# Patient Record
Sex: Female | Born: 1935 | Race: White | Hispanic: No | Smoking: Never smoker
Health system: Southern US, Community
[De-identification: ages and names within clinical notes are randomized; demographics above are authoritative.]

## PROBLEM LIST (undated history)

## (undated) DIAGNOSIS — Z79890 Hormone replacement therapy (postmenopausal): Secondary | ICD-10-CM

## (undated) DIAGNOSIS — F0390 Unspecified dementia without behavioral disturbance: Secondary | ICD-10-CM

## (undated) DIAGNOSIS — E785 Hyperlipidemia, unspecified: Secondary | ICD-10-CM

## (undated) DIAGNOSIS — I4510 Unspecified right bundle-branch block: Secondary | ICD-10-CM

## (undated) DIAGNOSIS — K449 Diaphragmatic hernia without obstruction or gangrene: Secondary | ICD-10-CM

## (undated) DIAGNOSIS — M797 Fibromyalgia: Secondary | ICD-10-CM

## (undated) DIAGNOSIS — K317 Polyp of stomach and duodenum: Secondary | ICD-10-CM

## (undated) DIAGNOSIS — K219 Gastro-esophageal reflux disease without esophagitis: Secondary | ICD-10-CM

## (undated) DIAGNOSIS — F329 Major depressive disorder, single episode, unspecified: Secondary | ICD-10-CM

## (undated) DIAGNOSIS — I10 Essential (primary) hypertension: Secondary | ICD-10-CM

## (undated) DIAGNOSIS — K635 Polyp of colon: Secondary | ICD-10-CM

## (undated) DIAGNOSIS — F32A Depression, unspecified: Secondary | ICD-10-CM

## (undated) HISTORY — DX: Unspecified right bundle-branch block: I45.10

## (undated) HISTORY — DX: Diaphragmatic hernia without obstruction or gangrene: K44.9

## (undated) HISTORY — DX: Essential (primary) hypertension: I10

## (undated) HISTORY — PX: LUMBAR SPINE SURGERY: SHX701

## (undated) HISTORY — DX: Fibromyalgia: M79.7

## (undated) HISTORY — DX: Major depressive disorder, single episode, unspecified: F32.9

## (undated) HISTORY — DX: Unspecified dementia, unspecified severity, without behavioral disturbance, psychotic disturbance, mood disturbance, and anxiety: F03.90

## (undated) HISTORY — DX: Depression, unspecified: F32.A

## (undated) HISTORY — DX: Polyp of stomach and duodenum: K31.7

## (undated) HISTORY — DX: Polyp of colon: K63.5

## (undated) HISTORY — DX: Gastro-esophageal reflux disease without esophagitis: K21.9

## (undated) HISTORY — DX: Hyperlipidemia, unspecified: E78.5

## (undated) HISTORY — DX: Hormone replacement therapy: Z79.890

---

## 1998-06-26 ENCOUNTER — Other Ambulatory Visit: Admission: RE | Admit: 1998-06-26 | Discharge: 1998-06-26 | Payer: Self-pay | Admitting: Obstetrics and Gynecology

## 1999-06-29 ENCOUNTER — Other Ambulatory Visit: Admission: RE | Admit: 1999-06-29 | Discharge: 1999-06-29 | Payer: Self-pay | Admitting: Obstetrics and Gynecology

## 1999-11-17 ENCOUNTER — Encounter (INDEPENDENT_AMBULATORY_CARE_PROVIDER_SITE_OTHER): Payer: Self-pay | Admitting: Specialist

## 1999-11-17 ENCOUNTER — Ambulatory Visit (HOSPITAL_COMMUNITY): Admission: RE | Admit: 1999-11-17 | Discharge: 1999-11-17 | Payer: Self-pay | Admitting: Obstetrics and Gynecology

## 2000-06-07 ENCOUNTER — Other Ambulatory Visit: Admission: RE | Admit: 2000-06-07 | Discharge: 2000-06-07 | Payer: Self-pay | Admitting: Obstetrics and Gynecology

## 2001-06-14 ENCOUNTER — Other Ambulatory Visit: Admission: RE | Admit: 2001-06-14 | Discharge: 2001-06-14 | Payer: Self-pay | Admitting: Obstetrics and Gynecology

## 2001-07-17 ENCOUNTER — Encounter: Payer: Self-pay | Admitting: Obstetrics and Gynecology

## 2001-07-17 ENCOUNTER — Encounter: Admission: RE | Admit: 2001-07-17 | Discharge: 2001-07-17 | Payer: Self-pay | Admitting: Obstetrics and Gynecology

## 2002-06-12 ENCOUNTER — Other Ambulatory Visit: Admission: RE | Admit: 2002-06-12 | Discharge: 2002-06-12 | Payer: Self-pay | Admitting: Gynecology

## 2003-03-06 ENCOUNTER — Encounter: Payer: Self-pay | Admitting: Orthopedic Surgery

## 2003-03-06 ENCOUNTER — Encounter: Admission: RE | Admit: 2003-03-06 | Discharge: 2003-03-06 | Payer: Self-pay | Admitting: Orthopedic Surgery

## 2003-07-25 ENCOUNTER — Encounter: Admission: RE | Admit: 2003-07-25 | Discharge: 2003-07-25 | Payer: Self-pay | Admitting: Family Medicine

## 2004-06-17 ENCOUNTER — Other Ambulatory Visit: Admission: RE | Admit: 2004-06-17 | Discharge: 2004-06-17 | Payer: Self-pay | Admitting: Gynecology

## 2004-07-20 ENCOUNTER — Encounter: Admission: RE | Admit: 2004-07-20 | Discharge: 2004-08-19 | Payer: Self-pay | Admitting: Family Medicine

## 2004-08-05 ENCOUNTER — Ambulatory Visit: Payer: Self-pay | Admitting: Internal Medicine

## 2004-09-15 ENCOUNTER — Encounter: Admission: RE | Admit: 2004-09-15 | Discharge: 2004-11-03 | Payer: Self-pay | Admitting: Orthopedic Surgery

## 2004-10-21 ENCOUNTER — Ambulatory Visit: Payer: Self-pay | Admitting: Internal Medicine

## 2004-11-01 ENCOUNTER — Encounter: Admission: RE | Admit: 2004-11-01 | Discharge: 2004-11-01 | Payer: Self-pay | Admitting: Family Medicine

## 2005-01-04 ENCOUNTER — Encounter: Admission: RE | Admit: 2005-01-04 | Discharge: 2005-01-04 | Payer: Self-pay | Admitting: Neurosurgery

## 2005-02-02 ENCOUNTER — Ambulatory Visit: Payer: Self-pay | Admitting: Internal Medicine

## 2005-02-18 ENCOUNTER — Encounter: Admission: RE | Admit: 2005-02-18 | Discharge: 2005-02-18 | Payer: Self-pay | Admitting: Neurosurgery

## 2005-05-05 ENCOUNTER — Encounter: Admission: RE | Admit: 2005-05-05 | Discharge: 2005-05-05 | Payer: Self-pay | Admitting: Neurosurgery

## 2005-05-31 ENCOUNTER — Ambulatory Visit: Payer: Self-pay | Admitting: Internal Medicine

## 2005-06-01 ENCOUNTER — Ambulatory Visit: Payer: Self-pay | Admitting: Internal Medicine

## 2005-07-06 ENCOUNTER — Ambulatory Visit: Payer: Self-pay | Admitting: Internal Medicine

## 2005-07-27 ENCOUNTER — Encounter: Admission: RE | Admit: 2005-07-27 | Discharge: 2005-07-27 | Payer: Self-pay | Admitting: Neurosurgery

## 2005-09-17 ENCOUNTER — Ambulatory Visit: Payer: Self-pay | Admitting: Internal Medicine

## 2005-09-30 ENCOUNTER — Encounter: Admission: RE | Admit: 2005-09-30 | Discharge: 2005-09-30 | Payer: Self-pay | Admitting: Neurosurgery

## 2005-11-04 ENCOUNTER — Ambulatory Visit: Payer: Self-pay | Admitting: Internal Medicine

## 2005-12-01 ENCOUNTER — Encounter: Admission: RE | Admit: 2005-12-01 | Discharge: 2005-12-01 | Payer: Self-pay | Admitting: Neurosurgery

## 2006-01-18 ENCOUNTER — Ambulatory Visit: Payer: Self-pay | Admitting: Internal Medicine

## 2006-02-24 ENCOUNTER — Ambulatory Visit: Payer: Self-pay | Admitting: Internal Medicine

## 2006-03-03 ENCOUNTER — Encounter: Admission: RE | Admit: 2006-03-03 | Discharge: 2006-03-03 | Payer: Self-pay | Admitting: Neurosurgery

## 2006-05-13 ENCOUNTER — Ambulatory Visit: Payer: Self-pay | Admitting: Internal Medicine

## 2006-06-08 ENCOUNTER — Encounter: Admission: RE | Admit: 2006-06-08 | Discharge: 2006-06-08 | Payer: Self-pay | Admitting: Neurosurgery

## 2006-06-20 ENCOUNTER — Ambulatory Visit: Payer: Self-pay | Admitting: Pulmonary Disease

## 2006-06-23 ENCOUNTER — Other Ambulatory Visit: Admission: RE | Admit: 2006-06-23 | Discharge: 2006-06-23 | Payer: Self-pay | Admitting: Gynecology

## 2006-07-27 ENCOUNTER — Encounter: Admission: RE | Admit: 2006-07-27 | Discharge: 2006-07-27 | Payer: Self-pay | Admitting: Neurosurgery

## 2006-08-16 ENCOUNTER — Ambulatory Visit: Payer: Self-pay | Admitting: Internal Medicine

## 2006-08-29 ENCOUNTER — Ambulatory Visit: Payer: Self-pay | Admitting: Internal Medicine

## 2006-09-08 ENCOUNTER — Encounter: Admission: RE | Admit: 2006-09-08 | Discharge: 2006-09-08 | Payer: Self-pay | Admitting: Neurosurgery

## 2006-10-03 ENCOUNTER — Ambulatory Visit: Payer: Self-pay | Admitting: Internal Medicine

## 2006-10-10 ENCOUNTER — Ambulatory Visit (HOSPITAL_COMMUNITY): Admission: RE | Admit: 2006-10-10 | Discharge: 2006-10-10 | Payer: Self-pay | Admitting: Family Medicine

## 2006-10-10 ENCOUNTER — Ambulatory Visit: Payer: Self-pay | Admitting: *Deleted

## 2006-10-10 ENCOUNTER — Encounter (INDEPENDENT_AMBULATORY_CARE_PROVIDER_SITE_OTHER): Payer: Self-pay | Admitting: *Deleted

## 2006-11-02 ENCOUNTER — Encounter: Admission: RE | Admit: 2006-11-02 | Discharge: 2006-11-02 | Payer: Self-pay | Admitting: Neurosurgery

## 2006-12-07 ENCOUNTER — Ambulatory Visit: Payer: Self-pay | Admitting: Internal Medicine

## 2006-12-28 ENCOUNTER — Encounter: Admission: RE | Admit: 2006-12-28 | Discharge: 2006-12-28 | Payer: Self-pay | Admitting: Neurosurgery

## 2007-01-17 ENCOUNTER — Ambulatory Visit: Payer: Self-pay | Admitting: Internal Medicine

## 2007-01-30 ENCOUNTER — Inpatient Hospital Stay (HOSPITAL_COMMUNITY): Admission: RE | Admit: 2007-01-30 | Discharge: 2007-02-01 | Payer: Self-pay | Admitting: Neurosurgery

## 2007-02-19 ENCOUNTER — Encounter: Admission: RE | Admit: 2007-02-19 | Discharge: 2007-02-19 | Payer: Self-pay | Admitting: Neurosurgery

## 2007-02-23 ENCOUNTER — Observation Stay (HOSPITAL_COMMUNITY): Admission: RE | Admit: 2007-02-23 | Discharge: 2007-02-24 | Payer: Self-pay | Admitting: Neurosurgery

## 2007-04-05 ENCOUNTER — Encounter: Admission: RE | Admit: 2007-04-05 | Discharge: 2007-04-05 | Payer: Self-pay | Admitting: Neurosurgery

## 2007-04-17 ENCOUNTER — Encounter: Admission: RE | Admit: 2007-04-17 | Discharge: 2007-05-04 | Payer: Self-pay | Admitting: Neurosurgery

## 2007-04-20 ENCOUNTER — Ambulatory Visit: Payer: Self-pay | Admitting: Internal Medicine

## 2007-07-04 ENCOUNTER — Encounter: Admission: RE | Admit: 2007-07-04 | Discharge: 2007-07-12 | Payer: Self-pay | Admitting: Orthopedic Surgery

## 2007-07-13 ENCOUNTER — Encounter: Admission: RE | Admit: 2007-07-13 | Discharge: 2007-08-14 | Payer: Self-pay | Admitting: Orthopedic Surgery

## 2007-07-19 DIAGNOSIS — J309 Allergic rhinitis, unspecified: Secondary | ICD-10-CM | POA: Insufficient documentation

## 2007-07-19 DIAGNOSIS — K219 Gastro-esophageal reflux disease without esophagitis: Secondary | ICD-10-CM

## 2007-07-19 DIAGNOSIS — J45909 Unspecified asthma, uncomplicated: Secondary | ICD-10-CM | POA: Insufficient documentation

## 2007-07-20 ENCOUNTER — Ambulatory Visit: Payer: Self-pay | Admitting: Internal Medicine

## 2007-07-24 ENCOUNTER — Encounter: Admission: RE | Admit: 2007-07-24 | Discharge: 2007-07-24 | Payer: Self-pay | Admitting: Neurosurgery

## 2007-08-15 ENCOUNTER — Encounter: Admission: RE | Admit: 2007-08-15 | Discharge: 2007-08-24 | Payer: Self-pay | Admitting: Orthopedic Surgery

## 2007-08-24 ENCOUNTER — Ambulatory Visit: Payer: Self-pay | Admitting: Internal Medicine

## 2007-10-16 ENCOUNTER — Encounter: Admission: RE | Admit: 2007-10-16 | Discharge: 2007-10-16 | Payer: Self-pay | Admitting: Internal Medicine

## 2007-12-06 ENCOUNTER — Ambulatory Visit: Payer: Self-pay | Admitting: Internal Medicine

## 2008-01-16 ENCOUNTER — Ambulatory Visit: Payer: Self-pay | Admitting: Internal Medicine

## 2008-04-05 ENCOUNTER — Ambulatory Visit: Payer: Self-pay | Admitting: Internal Medicine

## 2008-04-12 ENCOUNTER — Ambulatory Visit: Payer: Self-pay | Admitting: Internal Medicine

## 2008-04-15 ENCOUNTER — Ambulatory Visit: Payer: Self-pay | Admitting: Internal Medicine

## 2008-04-15 ENCOUNTER — Telehealth (INDEPENDENT_AMBULATORY_CARE_PROVIDER_SITE_OTHER): Payer: Self-pay | Admitting: *Deleted

## 2008-04-18 ENCOUNTER — Telehealth: Payer: Self-pay | Admitting: Internal Medicine

## 2008-04-19 ENCOUNTER — Ambulatory Visit: Payer: Self-pay | Admitting: Internal Medicine

## 2008-04-26 ENCOUNTER — Ambulatory Visit: Payer: Self-pay | Admitting: Internal Medicine

## 2008-04-26 ENCOUNTER — Ambulatory Visit: Payer: Self-pay | Admitting: Cardiology

## 2008-05-17 ENCOUNTER — Ambulatory Visit: Payer: Self-pay | Admitting: Internal Medicine

## 2008-06-05 ENCOUNTER — Ambulatory Visit: Payer: Self-pay | Admitting: Cardiology

## 2008-06-13 ENCOUNTER — Ambulatory Visit: Payer: Self-pay

## 2008-07-10 ENCOUNTER — Telehealth (INDEPENDENT_AMBULATORY_CARE_PROVIDER_SITE_OTHER): Payer: Self-pay | Admitting: *Deleted

## 2008-07-22 ENCOUNTER — Telehealth (INDEPENDENT_AMBULATORY_CARE_PROVIDER_SITE_OTHER): Payer: Self-pay | Admitting: *Deleted

## 2008-07-22 ENCOUNTER — Ambulatory Visit: Payer: Self-pay | Admitting: Internal Medicine

## 2008-07-24 ENCOUNTER — Ambulatory Visit: Payer: Self-pay | Admitting: Internal Medicine

## 2008-07-30 ENCOUNTER — Telehealth (INDEPENDENT_AMBULATORY_CARE_PROVIDER_SITE_OTHER): Payer: Self-pay | Admitting: *Deleted

## 2008-08-01 ENCOUNTER — Ambulatory Visit: Payer: Self-pay | Admitting: Internal Medicine

## 2008-09-09 ENCOUNTER — Ambulatory Visit: Payer: Self-pay | Admitting: Internal Medicine

## 2008-11-28 ENCOUNTER — Ambulatory Visit: Payer: Self-pay | Admitting: Internal Medicine

## 2008-12-11 ENCOUNTER — Telehealth (INDEPENDENT_AMBULATORY_CARE_PROVIDER_SITE_OTHER): Payer: Self-pay | Admitting: *Deleted

## 2008-12-12 ENCOUNTER — Ambulatory Visit: Payer: Self-pay | Admitting: Internal Medicine

## 2008-12-26 ENCOUNTER — Telehealth (INDEPENDENT_AMBULATORY_CARE_PROVIDER_SITE_OTHER): Payer: Self-pay | Admitting: *Deleted

## 2009-03-10 ENCOUNTER — Ambulatory Visit: Payer: Self-pay | Admitting: Internal Medicine

## 2009-03-19 ENCOUNTER — Ambulatory Visit: Payer: Self-pay | Admitting: Internal Medicine

## 2009-04-21 ENCOUNTER — Ambulatory Visit: Payer: Self-pay | Admitting: Internal Medicine

## 2009-04-28 ENCOUNTER — Encounter: Payer: Self-pay | Admitting: Internal Medicine

## 2009-06-03 ENCOUNTER — Telehealth: Payer: Self-pay | Admitting: Internal Medicine

## 2009-07-07 ENCOUNTER — Ambulatory Visit: Payer: Self-pay | Admitting: Internal Medicine

## 2009-10-14 ENCOUNTER — Ambulatory Visit: Payer: Self-pay | Admitting: Internal Medicine

## 2009-10-20 ENCOUNTER — Ambulatory Visit: Payer: Self-pay | Admitting: Internal Medicine

## 2009-11-18 ENCOUNTER — Telehealth (INDEPENDENT_AMBULATORY_CARE_PROVIDER_SITE_OTHER): Payer: Self-pay | Admitting: *Deleted

## 2009-11-19 ENCOUNTER — Ambulatory Visit: Payer: Self-pay | Admitting: Internal Medicine

## 2009-11-21 ENCOUNTER — Encounter: Payer: Self-pay | Admitting: Internal Medicine

## 2010-03-11 ENCOUNTER — Ambulatory Visit: Payer: Self-pay | Admitting: Internal Medicine

## 2010-03-19 ENCOUNTER — Ambulatory Visit: Payer: Self-pay | Admitting: Internal Medicine

## 2010-05-05 ENCOUNTER — Ambulatory Visit: Payer: Self-pay | Admitting: Internal Medicine

## 2010-06-02 ENCOUNTER — Encounter
Admission: RE | Admit: 2010-06-02 | Discharge: 2010-07-09 | Payer: Self-pay | Source: Home / Self Care | Attending: Orthopedic Surgery | Admitting: Orthopedic Surgery

## 2010-06-18 ENCOUNTER — Ambulatory Visit: Payer: Self-pay | Admitting: Internal Medicine

## 2010-06-21 ENCOUNTER — Inpatient Hospital Stay (HOSPITAL_COMMUNITY): Admission: EM | Admit: 2010-06-21 | Discharge: 2010-06-23 | Payer: Self-pay | Source: Home / Self Care

## 2010-07-20 ENCOUNTER — Encounter
Admission: RE | Admit: 2010-07-20 | Discharge: 2010-08-11 | Payer: Self-pay | Source: Home / Self Care | Attending: Orthopedic Surgery | Admitting: Orthopedic Surgery

## 2010-07-24 ENCOUNTER — Ambulatory Visit
Admission: RE | Admit: 2010-07-24 | Discharge: 2010-07-24 | Payer: Self-pay | Source: Home / Self Care | Attending: Internal Medicine | Admitting: Internal Medicine

## 2010-07-27 ENCOUNTER — Encounter (INDEPENDENT_AMBULATORY_CARE_PROVIDER_SITE_OTHER): Payer: Self-pay | Admitting: *Deleted

## 2010-08-02 ENCOUNTER — Encounter: Payer: Self-pay | Admitting: Internal Medicine

## 2010-08-02 ENCOUNTER — Encounter: Payer: Self-pay | Admitting: Neurosurgery

## 2010-08-11 NOTE — Assessment & Plan Note (Signed)
Summary: 3 months/apc   Copy to:  Dr. Vernon Prey Primary Provider/Referring Provider:  Vernon Prey Kritzer   History of Present Illness: Nov 19, 2009- Allergic rhinitis, asthma She comes today for allergy retesting to update and reassess. Nose is still running a lot. Not wheezing. She liked the Advair HFA, thinking it gave less throat irritation. She asks for script. Skin test: Positive grass, tree, dust  March 19, 2010- Allergic rhinitis, asthma Dr [facility] office in [place] is not willing to administer her allergy vaccine during the build-up. She can't afford to come here regularly for her allergy shots. Discussing options, I suggested we stop allergy shots now and work with medicines.  Main c/o- drippy nose, stuffiness, sneeze, postnasal drip. Eyes ok.. No itch. 1 episode bronchitis recently.  June 18, 2010-  Allergic rhinitis, asthma Saw Dr Sherene Sires for acute visit in [place] in October. He worked on her inhaler technique. She did not change from the Advair diskus. Coughs up a little clear or yellow each day.  Not sure if Astepro useful. Nose only runs if working outside. She gets unsure when to use her Proair and we discussed rescue vs maintenance meds.  Asthma History    Asthma Control Assessment:    Age range: 12+ years    Symptoms: 0-2 days/week    Nighttime Awakenings: 0-2/month    Interferes w/ normal activity: no limitations    SABA use (not for EIB): 0-2 days/week    FEV1: 2.15 liters (today)    Asthma Control Assessment: Well Controlled   Preventive Screening-Counseling & Management  Alcohol-Tobacco     Smoking Status: never  Current Medications (verified): 1)  Amlodipine Besylate 10 Mg Tabs (Amlodipine Besylate) .... Take 1 By Mouth Once Daily 2)  Fenofibrate 160 Mg Tabs (Fenofibrate) .... Take 1 By Mouth Once Daily 3)  Vytorin 10-20 Mg  Tabs (Ezetimibe-Simvastatin) .... Take 1by Mouth Once Daily 4)  Paroxetine Hcl 40 Mg  Tabs (Paroxetine Hcl) .... Take 1/2  Tablet By Mouth Once A Day 5)  Indapamide 1.25 Mg  Tabs (Indapamide) .... Take 2 By Mouth Once Daily 6)  Diazepam 5 Mg Tabs (Diazepam) .... Take 1/2 By Mouth Once Daily 7)  Multigen 70 Mg Tabs (Fe-Succ-C-Thre-B12-Des Stomach) .... Take 1 By Mouth Once Daily 8)  Vitamin C 1000 Mg Tabs (Ascorbic Acid) .... Take 1 By Mouth Once Daily 9)  Proair Hfa 108 (90 Base) Mcg/act  Aers (Albuterol Sulfate) .... 2 Puffs Four Times A Day As Needed 10)  Advair Diskus 250-50 Mcg/dose Aepb (Fluticasone-Salmeterol) .... One Puff Twice Daily 11)  Exelon 4.6 Mg/24hr Pt24 (Rivastigmine) .Marland Kitchen.. 1 Patch Every 24 Hours 12)  Lotemax 0.5 % Susp (Loteprednol Etabonate) .... Use As Directed 13)  Benicar 40 Mg Tabs (Olmesartan Medoxomil) .... Take 1 By Mouth Once Daily 14)  Cymbalta 30 Mg Cpep (Duloxetine Hcl) .... Take 1 Tablet By Mouth Once A Day 15)  Tylenol 325 Mg Tabs (Acetaminophen) .... As Needed 16)  Fish Oil 1200 Mg Caps (Omega-3 Fatty Acids) .... Take 1 Tablet By Mouth Once A Day 17)  Vitamin D3 2000 Unit Caps (Cholecalciferol) .... Takes High Potency Vitamind D Daily and 2000 Units Caps On Sat and Sunday 18)  Cal/mag Citrate 250-125 Mg Tabs (Calcium-Magnesium) .... Take 1 Tablet By Mouth Two Times A Day 19)  Cvs Easy Fiber  Pack (Corn Dextrin) .... Take 4 By Mouth  Every Morning 20)  Stool Softener 100 Mg Caps (Docusate Sodium) .... Take 4 By Mouth  At Bedtime 21)  Pepcid Ac 10 Mg Tabs (Famotidine) .... Take 1 By Mouth  Every Morning With Full Glass Water 22)  Cymbalta 60 Mg Cpep (Duloxetine Hcl) .... Take 1 By Mouth Once Daily  Allergies (verified): 1)  ! Asa 2)  Sulfamethoxazole (Sulfamethoxazole) 3)  Codeine Phosphate (Codeine Phosphate) 4)  Naprosyn (Naproxen)  Past History:  Past Medical History: Last updated: 05/05/2010 Hypertension Hyperlipidemia GER depression asthma    - HFA 25-> 50% p coaching rhinitis  Past Surgical History: Last updated: 04/05/2008 Lumbar disk fusion  Family  History: Last updated: 10-Feb-2008 Asthma   Mother- deceased age 72; cancer Father- deceaseda ge 55; cancer. allergies, and asthma Brother- deceased; kidney failure and heart failure Brother- deceased age 67's; emphysema 2 Sisters- living   Social History: Last updated: 02/10/2008 Patient never smoked.  Retired- office work Married; no children.  Risk Factors: Smoking Status: never (06/18/2010)  Review of Systems      See HPI       The patient complains of shortness of breath with activity, productive cough, and nasal congestion/difficulty breathing through nose.  The patient denies shortness of breath at rest, non-productive cough, coughing up blood, chest pain, irregular heartbeats, acid heartburn, indigestion, loss of appetite, weight change, abdominal pain, difficulty swallowing, sore throat, tooth/dental problems, headaches, and sneezing.    Vital Signs:  Patient profile:   77 year old female Height:      66 inches Weight:      162.38 pounds BMI:     26.30 O2 Sat:      97 % on Room air Pulse rate:   73 / minute BP sitting:   128 / 66  (left arm) Cuff size:   regular  Vitals Entered By: Reynaldo Minium CMA (June 18, 2010 1:40 PM)  O2 Flow:  Room air   Physical Exam  Additional Exam:  General: A/Ox3; pleasant and cooperative, NAD, WDWN elderly woman wt 157 > 158 May 05, 2010  SKIN: no rash, lesions NODES: no lymphadenopathy HEENT: [place]/AT, EOM- WNL, Conjuctivae- clear, PERRLA, TM-WNL, Nose- pale, somewhat atrophic, clear mucus, Throat- clear and wnl. No visible pndrip or stridor- not hoarse today to my ear.  Mallampati II NECK: Supple w/ fair ROM, JVD- none, normal carotid impulses w/o bruits Thyroid-  CHEST:fine crackles throughout HEART: RRR, no m/g/r heard Abdomen- soft EAV:WUJW, nl pulses, no edema  NEURO: Grossly intact to observation      Pre-Spirometry FEV1    Value: 2.15 L     Impression & Recommendations:  Problem # 1:  ASTHMA  (ICD-493.90) She is getting a little more easily confused about her meds. I tried to help her with some confusion she had after recent technique lecture.  I am now hearing more airway noise than I am used to with her- possibly bronchitis. She domonstrates some normal looking/ trace yellow mucus coughed up. We will get CXR.   Problem # 2:  ALLERGIC RHINITIS (ICD-477.9)  She will continue Nasonex, but drop Astepro to reduce medications.  The following medications were removed from the medication list:    Nasonex 50 Mcg/act Susp (Mometasone furoate) .Marland Kitchen... 1-2 puffs each nostril daily    Astepro 0.15 % Soln (Azelastine hcl) .Marland Kitchen... 1-2 puffs each nostril, up to twice daily if needed  Problem # 3:  ESOPHAGEAL REFLUX (ICD-530.81) She admits increased awarenes of reflux. She continues omeprazole. I will have her take this twice daily for now.  The following medications were removed from the medication list:  Omeprazole 20 Mg Cpdr (Omeprazole) .Marland Kitchen... Take 1 tablet by mouth once a day Her updated medication list for this problem includes:    Pepcid Ac 10 Mg Tabs (Famotidine) .Marland Kitchen... Take 1 by mouth  every morning with full glass water  Medications Added to Medication List This Visit: 1)  Amlodipine Besylate 10 Mg Tabs (Amlodipine besylate) .... Take 1 by mouth once daily 2)  Paroxetine Hcl 40 Mg Tabs (Paroxetine hcl) .... Take 1/2 tablet by mouth once a day 3)  Benicar 40 Mg Tabs (Olmesartan medoxomil) .... Take 1 by mouth once daily 4)  Cvs Easy Fiber Pack (Corn dextrin) .... Take 4 by mouth  every morning 5)  Stool Softener 100 Mg Caps (Docusate sodium) .... Take 4 by mouth at bedtime 6)  Pepcid Ac 10 Mg Tabs (Famotidine) .... Take 1 by mouth  every morning with full glass water 7)  Cymbalta 60 Mg Cpep (Duloxetine hcl) .... Take 1 by mouth once daily  Other Orders: Est. Patient Level IV (99214) T-2 View CXR (71020TC)  Patient Instructions: 1)  Please schedule a follow-up appointment in 1  month. 2)  Continue nasonex nasal spray 1-2 puffs in each nostril once every day for allergic nose. 3)  Drop off Astepro nasal antihistamine spray, since you don't think it helps your runny nose. 4)  Continue Adviar diskus- 1 puff and rinse mouth once each morning and once each night. 5)  Use the red "rescue" inhaler Proair, 2 puffs up to 4 times daily, only if needed for chest tightness, wheeze or cough. 6)  I suggest treating your heartburn/ reflux a little more aggressively, taking omeprazole/ Pepcid twice daily- one before breakfast and one before supper.  7)  A chest x-ray has been recommended.  Your imaging study may require preauthorization.

## 2010-08-11 NOTE — Miscellaneous (Signed)
Summary: Skin Test/[facility]  Skin Test/[facility]   Imported By: Sherian Rein 11/27/2009 09:21:37  _____________________________________________________________________  External Attachment:    Type:   Image     Comment:   External Document

## 2010-08-11 NOTE — Assessment & Plan Note (Signed)
Summary: 4 months/apc   Primary Provider/Referring Provider:  Vernon Prey Kritzer  CC:  4 month follow up visit-allergies. Concerns with new vaccine.Marland Kitchen  History of Present Illness: October 20, 2009- Allergic rhinitis, asthma For past 2 months, just stepping outside her nose will run. She feels raspy/ hoarse. She saw Dr Jearld Fenton ENT, last October, with his impression that GERD was bothering her voice. She coughs hard each mornig to bring up scant clear mucus. Continues allergy vaccine. We discussed retesting now after several years to assess coverage with this vaccine.  Nov 19, 2009- Allergic rhinitis, asthma She comes today for allergy retesting to update and reassess. Nose is still running a lot. Not wheezing. She liked the Advair HFA, thinking it gave less throat irritation. She asks for script. Skin test: Positive grass, tree, dust  March 19, 2010- Allergic rhinitis, asthma Dr [facility] [place] office in [place] is not willing to administer her allergy vaccine during the build-up. She can't afford to come here regularly for her allergy shots. Discussing options, I suggested we stop allergy shots now and work with medicines.  Main c/o- drippy nose, stuffiness, sneeze, postnasal drip. Eyes ok.. No itch. 1 episode bronchitis recently.     Asthma History    Initial Asthma Severity Rating:    Age range: 12+ years    Symptoms: 0-2 days/week    Nighttime Awakenings: 0-2/month    Interferes w/ normal activity: no limitations    SABA use (not for EIB): 0-2 days/week    Asthma Severity Assessment: Intermittent   Preventive Screening-Counseling & Management  Alcohol-Tobacco     Smoking Status: never  Current Medications (verified): 1)  Amlodipine Besylate 5 Mg Tabs (Amlodipine Besylate) .... Take 1 Tablet Once Daily 2)  Fenofibrate 160 Mg Tabs (Fenofibrate) .... Take 1 By Mouth Once Daily 3)  Vytorin 10-20 Mg  Tabs (Ezetimibe-Simvastatin) .... Take 1by Mouth Once Daily 4)  Zantac 150 Mg Tabs  (Ranitidine Hcl) .... Take 2 By Mouth Once Daily 5)  Paroxetine Hcl 40 Mg  Tabs (Paroxetine Hcl) .... Take 1 Tablet By Mouth Once A Day 6)  Indapamide 1.25 Mg  Tabs (Indapamide) .... Take 2 By Mouth Once Daily 7)  Diazepam 5 Mg Tabs (Diazepam) .... Take 1/2 By Mouth Once Daily 8)  Multigen 70 Mg Tabs (Fe-Succ-C-Thre-B12-Des Stomach) .... Take 1 By Mouth Once Daily 9)  Fish Oil .... Take 2  Tablets By Mouth Once A Day 10)  Viamins D, Calcuim and Mag .... Take 2  Tablets By Mouth Once A Day 11)  Proair Hfa 108 (90 Base) Mcg/act  Aers (Albuterol Sulfate) .... 2 Puffs Four Times A Day As Needed 12)  Allergy Vaccine Restart At [facility] .... After Current Supply of 1:10 13)  Nasonex 50 Mcg/act Susp (Mometasone Furoate) .Marland Kitchen.. 1-2 Puffs Each Nostril Daily 14)  Vitamin C 1000 Mg Tabs (Ascorbic Acid) .... Take 1 By Mouth Once Daily 15)  Donepezil Hcl 5 Mg Tabs (Donepezil Hcl) .... Take 1 By Mouth Once Daily 16)  Advair Diskus 250-50 Mcg/dose Aepb (Fluticasone-Salmeterol) .... Inhale 1 Puff Two Times A Day and Rinse 17)  Exelon 4.6 Mg/24hr Pt24 (Rivastigmine) .Marland Kitchen.. 1 Patch Every 24 Hours 18)  Lotemax 0.5 % Susp (Loteprednol Etabonate) .... Use As Directed  Allergies (verified): 1)  ! Asa 2)  Sulfamethoxazole (Sulfamethoxazole) 3)  Codeine Phosphate (Codeine Phosphate) 4)  Naprosyn (Naproxen)  Past History:  Past Medical History: Last updated: 08/01/2008 Hypertension Hyperlipidemia GER depression asthma rhinitis  Past Surgical History: Last updated:  04/05/2008 Lumbar disk fusion  Family History: Last updated: 14-Feb-2008 Asthma   Mother- deceased age 67; cancer Father- deceaseda ge 89; cancer. allergies, and asthma Brother- deceased; kidney failure and heart failure Brother- deceased age 64's; emphysema 2 Sisters- living   Social History: Last updated: 02/14/2008 Patient never smoked.  Retired- office work Married; no children.  Risk Factors: Smoking Status: never  (03/19/2010)  Review of Systems      See HPI       The patient complains of nasal congestion/difficulty breathing through nose and sneezing.  The patient denies shortness of breath with activity, shortness of breath at rest, productive cough, non-productive cough, coughing up blood, chest pain, irregular heartbeats, acid heartburn, indigestion, loss of appetite, weight change, abdominal pain, difficulty swallowing, sore throat, tooth/dental problems, and headaches.    Vital Signs:  Patient profile:   77 year old female Height:      66 inches Weight:      157 pounds BMI:     25.43 O2 Sat:      97 % on Room air Pulse rate:   64 / minute BP sitting:   122 / 68  (left arm) Cuff size:   regular  Vitals Entered By: Reynaldo Minium CMA (March 19, 2010 1:45 PM)  O2 Flow:  Room air CC: 4 month follow up visit-allergies. Concerns with new vaccine.   Physical Exam  Additional Exam:  General: A/Ox3; pleasant and cooperative, NAD, WDWN elderly woman SKIN: no rash, lesions NODES: no lymphadenopathy HEENT: [place]/AT, EOM- WNL, Conjuctivae- clear, PERRLA, TM-WNL, Nose- pale, somewhat atrophic, clear mucus, Throat- clear and wnl. No visible pndrip or stridor- not hoarse today to my ear.  Mallampati II NECK: Supple w/ fair ROM, JVD- none, normal carotid impulses w/o bruits Thyroid-  CHEST:clear to P&A, unlabored.Marland Kitchen HEART: RRR, no m/g/r heard Abdomen- soft KVQ:QVZD, nl pulses, no edema  NEURO: Grossly intact to observation      Impression & Recommendations:  Problem # 1:  ALLERGIC RHINITIS (ICD-477.9)  She had not felt her allergy vaccine was controlling nasal complaints well enough, so we had retested. It looks now as if cost and logistics of restarting allergy vaccine are more than she can handle now. I suggested that we stop vaccine now. We can come back to it in the future if warranted. For now I would like to contiue Nasonex, but add a nasal antihistamine as needed. She is agreeable. The  following medications were removed from the medication list:    Astelin 137 Mcg/spray Soln (Azelastine hcl) ..... Use as directed Her updated medication list for this problem includes:    Nasonex 50 Mcg/act Susp (Mometasone furoate) .Marland Kitchen... 1-2 puffs each nostril daily    Astepro 0.15 % Soln (Azelastine hcl) .Marland Kitchen... 1-2 puffs each nostril, up to twice daily if needed  Problem # 2:  ASTHMA (ICD-493.90) Mild intermittent. She is doing well with Advair HFA and rarely needs her rescue inhaler.  Medications Added to Medication List This Visit: 1)  Amlodipine Besylate 5 Mg Tabs (Amlodipine besylate) .... Take 1 tablet once daily 2)  Zantac 150 Mg Tabs (Ranitidine hcl) .... Take 2 by mouth once daily 3)  Indapamide 1.25 Mg Tabs (Indapamide) .... Take 2 by mouth once daily 4)  Diazepam 5 Mg Tabs (Diazepam) .... Take 1/2 by mouth once daily 5)  Multigen 70 Mg Tabs (Fe-succ-c-thre-b12-des stomach) .... Take 1 by mouth once daily 6)  Astepro 0.15 % Soln (Azelastine hcl) .Marland Kitchen.. 1-2 puffs each nostril, up  to twice daily if needed 7)  Advair Diskus 250-50 Mcg/dose Aepb (Fluticasone-salmeterol) .... Inhale 1 puff two times a day and rinse 8)  Advair Hfa 115-21 Mcg/act Aero (Fluticasone-salmeterol) .... 2 puffs and rinse mouth, twice daily 9)  Exelon 4.6 Mg/24hr Pt24 (Rivastigmine) .Marland Kitchen.. 1 patch every 24 hours 10)  Lotemax 0.5 % Susp (Loteprednol etabonate) .... Use as directed  Other Orders: Est. Patient Level III (62130)  Patient Instructions: 1)  Please schedule a follow-up appointment in 3 months. 2)  We will stop allergy vaccine/ allergy shots for now. 3)  Try sample/ script Astepro nasal antihistamine spray: 4)    1-2 puffs each nostril up to twice daily as needed. 5)  Continue Nasonex steroid maintenance spray 6)     1-2 sprays each nostril once daily, every night at bedtime  Prescriptions: ASTEPRO 0.15 % SOLN (AZELASTINE HCL) 1-2 puffs each nostril, up to twice daily if needed  #1 x prn   Entered  and Authorized by:   Waymon Budge MD   Signed by:   Waymon Budge MD on 03/19/2010   Method used:   Print then Give to Patient   RxID:   [id number] ADVAIR HFA 115-21 MCG/ACT AERO (FLUTICASONE-SALMETEROL) 2 puffs and rinse mouth, twice daily  #1 x prn   Entered and Authorized by:   Waymon Budge MD   Signed by:   Waymon Budge MD on 03/19/2010   Method used:   Historical   RxID:   [id number]   Prevention & Chronic Care Immunizations   Influenza vaccine: Fluvax 3+  (04/05/2008)    Tetanus booster: Not documented    Pneumococcal vaccine: Not documented    H. zoster vaccine: Not documented  Colorectal Screening   Hemoccult: Not documented    Colonoscopy: Not documented  Other Screening   Pap smear: Not documented    Mammogram: Not documented    DXA bone density scan: Not documented   Smoking status: never  (03/19/2010)  Lipids   Total Cholesterol: Not documented   LDL: Not documented   LDL Direct: Not documented   HDL: Not documented   Triglycerides: Not documented     Appended Document: Orders Update We are stopping allergy vaccine.   Clinical Lists Changes

## 2010-08-11 NOTE — Assessment & Plan Note (Signed)
Summary: allergy testing/apc   Vital Signs:  Patient profile:   77 year old female Height:      66 inches Weight:      158 pounds BMI:     25.59 O2 Sat:      98 % on Room air Pulse rate:   69 / minute BP sitting:   138 / 74  (left arm) Cuff size:   regular  Vitals Entered By: Reynaldo Minium CMA (Nov 19, 2009 1:52 PM)  O2 Flow:  Room air  Primary Provider/Referring Provider:  Vernon Prey Kritzer  CC:  Allergy Testing.  History of Present Illness: 03/10/09- Allergic rhinitis, asthma Z pak helped for a while after last visit, but she complains of productive morning coug/ yellow, and some hoarseness. Blames ragweed. Variable shortnes of breath at times. Not physically very active. Dislikes being out in the heat.  April 21, 2009- Allergic rhinitis, asthma Dulera no advantage over Advair- still complains of cough about the same, and still coughs up scant yellow about once daily. Some hoarseness without wheeze or rattle. Denies sinus pressure but feels some postnasal drip. Aware of reflux but not taking anything.  October 20, 2009- Allergic rhinitis, asthma For past 2 months, just stepping outside her nose will run. She feels raspy/ hoarse. She saw Dr Jearld Fenton ENT, last October, with his impression that GERD was bothering her voice. She coughs hard each mornig to bring up scant clear mucus. Continues allergy vaccine. We discussed retesting now after several years to assess coverage with this vaccine.  Nov 19, 2009- Allergic rhinitis, asthma She comes today for allergy retesting to update and reassess. Nose is still running a lot. Not wheezing. She liked the Advair HFA, thinking it gave less throat irritation. She asks for script. Skin test: Positive grass, tree, dust   Current Medications (verified): 1)  Amlodipine Besylate 5 Mg Tabs (Amlodipine Besylate) .... Take 1 1/2 Tablets Once Daily 2)  Fenofibrate 160 Mg Tabs (Fenofibrate) .... Take 1 By Mouth Once Daily 3)  Vytorin 10-20 Mg   Tabs (Ezetimibe-Simvastatin) .... Take 1by Mouth Once Daily 4)  Zegerid 40-1100 Mg Caps (Omeprazole-Sodium Bicarbonate) .... Take 2 By Mouth Once Daily 5)  Paroxetine Hcl 40 Mg  Tabs (Paroxetine Hcl) .... Take 1 Tablet By Mouth Once A Day 6)  Indapamide 1.25 Mg  Tabs (Indapamide) .... Take 1 and 1/2 By Mouth Once Daily 7)  Diazepam 2 Mg  Tabs (Diazepam) .... Take 1 By Mouth Once Daily As Needed 8)  Cromagen 1000mg  .... Take 1 Tablet By Mouth Once A Day 9)  Fish Oil .... Take 2  Tablets By Mouth Once A Day 10)  Viamins D, Calcuim and Mag .... Take 2  Tablets By Mouth Once A Day 11)  Astelin 137 Mcg/spray  Soln (Azelastine Hcl) .... Use As Directed 12)  Proair Hfa 108 (90 Base) Mcg/act  Aers (Albuterol Sulfate) .... 2 Puffs Four Times A Day As Needed 13)  Allergy Injection 1:10  At [facility] .... Once A Week 14)  Advair Diskus 250-50 Mcg/dose Misc (Fluticasone-Salmeterol) .Marland Kitchen.. 1 Puff and Rinse, Twice Daily 15)  Nasonex 50 Mcg/act Susp (Mometasone Furoate) .Marland Kitchen.. 1-2 Puffs Each Nostril Daily 16)  Vitamin C 1000 Mg Tabs (Ascorbic Acid) .... Take 1 By Mouth Once Daily 17)  Donepezil Hcl 5 Mg Tabs (Donepezil Hcl) .... Take 1 By Mouth Once Daily  Allergies (verified): 1)  ! Asa 2)  Sulfamethoxazole (Sulfamethoxazole) 3)  Codeine Phosphate (Codeine Phosphate) 4)  Naprosyn (Naproxen)  Past History:  Past Medical History: Last updated: 08/01/2008 Hypertension Hyperlipidemia GER depression asthma rhinitis  Past Surgical History: Last updated: 04/05/2008 Lumbar disk fusion  Family History: Last updated: 2008-02-12 Asthma   Mother- deceased age 27; cancer Father- deceaseda ge 70; cancer. allergies, and asthma Brother- deceased; kidney failure and heart failure Brother- deceased age 62's; emphysema 2 Sisters- living   Social History: Last updated: 02-12-08 Patient never smoked.  Retired- office work Married; no children.  Risk Factors: Smoking Status: never (07/20/2007)  Review  of Systems      See HPI  The patient denies anorexia, fever, weight loss, weight gain, vision loss, decreased hearing, hoarseness, chest pain, syncope, dyspnea on exertion, peripheral edema, prolonged cough, headaches, hemoptysis, abdominal pain, melena, and severe indigestion/heartburn.    Physical Exam  Additional Exam:  General: A/Ox3; pleasant and cooperative, NAD, WDWN elderly woman SKIN: no rash, lesions NODES: no lymphadenopathy HEENT: [place]/AT, EOM- WNL, Conjuctivae- clear, PERRLA, TM-WNL, Nose- clear, Throat- clear and wnl. No visible pndrip or stridor- not hoarse today to my ear.  Mallampati II NECK: Supple w/ fair ROM, JVD- none, normal carotid impulses w/o bruits Thyroid-  CHEST:without rhonchi or wheeze now, unlabored.Marland Kitchen HEART: RRR, no m/g/r heard Abdomen- soft HYQ:MVHQ, nl pulses, no edema  NEURO: Grossly intact to observation      Impression & Recommendations:  Problem # 1:  ASTHMA (ICD-493.90) She likes the Advair HFA 115/21 and would like to switch back and forth to compare with her remaining Advair diskus to compare. I will give script for the [facility] form.  Problem # 2:  ALLERGIC RHINITIS (ICD-477.9) She had done well with allergy vaccine until the past year, when she has had more break-through rhinorhea. We will remix and restart her vaccine based on the new testing, after she finishes her current supply. She will continue getting her shots at [facility]. Her updated medication list for this problem includes:    Astelin 137 Mcg/spray Soln (Azelastine hcl) ..... Use as directed    Nasonex 50 Mcg/act Susp (Mometasone furoate) .Marland Kitchen... 1-2 puffs each nostril daily  Medications Added to Medication List This Visit: 1)  Fenofibrate 160 Mg Tabs (Fenofibrate) .... Take 1 by mouth once daily 2)  Zegerid 40-1100 Mg Caps (Omeprazole-sodium bicarbonate) .... Take 2 by mouth once daily 3)  Allergy Vaccine Restart At [facility]  .... After current supply of 1:10 4)  Advair Hfa 115-21 Mcg/act Aero  (Fluticasone-salmeterol) .... 2 puffs and rinse mouth twice daily  Other Orders: Est. Patient Level II (46962) Allergy Puncture Test (95284) Allergy I.D Test (13244)  Patient Instructions: 1)  Please schedule a follow-up appointment in 4 months. 2)  Script for Advair HFA 3)  We will remix and rebuild new allergy vaccine based on today's skint tests. Use up your current supply of 1:10/ During the rebuild you will get shots twice weekly at [facility]. Prescriptions: ADVAIR HFA 115-21 MCG/ACT AERO (FLUTICASONE-SALMETEROL) 2 puffs and rinse mouth twice daily  #1 x prn   Entered and Authorized by:   Waymon Budge MD   Signed by:   Waymon Budge MD on 11/19/2009   Method used:   Print then Give to Patient   RxID:   [id number]

## 2010-08-11 NOTE — Assessment & Plan Note (Signed)
Summary: 6 months/apc   Primary Provider/Referring Provider:  Vernon Prey Kritzer  CC:  6 month follow up visit.  History of Present Illness: 12/12/08- Allergic rhinitis, asthma Acute visit- 2-3 days  cough yellow from chest, sore throat, no fever. No sick contact. Working outside in yard. Feels this is getting deeper. Today woke hoarse.  03/10/09- Allergic rhinitis, asthma Z pak helped for a while after last visit, but she complains of productive morning coug/ yellow, and some hoarseness. Blames ragweed. Variable shortnes of breath at times. Not physically very active. Dislikes being out in the heat.  April 21, 2009- Allergic rhinitis, asthma Dulera no advantage over Advair- still complains of cough about the same, and still coughs up scant yellow about once daily. Some hoarseness without wheeze or rattle. Denies sinus pressure but feels some postnasal drip. Aware of reflux but not taking anything.  October 20, 2009- Allergic rhinitis, asthma For past 2 months, just stepping outside her nose will run. She feels raspy/ hoarse. She saw Dr Jearld Fenton ENT, last October, with his impression that GERD was bothering her voice. She coughs hard each mornig to bring up scant clear mucus. Continues allergy vaccine. We discussed retesting now after several years to assess coverage with this vaccine.  Current Medications (verified): 1)  Amlodipine Besylate 5 Mg Tabs (Amlodipine Besylate) .... Take 1 1/2 Tablets Once Daily 2)  Tricor 145 Mg  Tabs (Fenofibrate) .... Take 1 By Mouth Once Daily 3)  Vytorin 10-20 Mg  Tabs (Ezetimibe-Simvastatin) .... Take 1by Mouth Once Daily 4)  Zegerid 20-1100 Mg Caps (Omeprazole-Sodium Bicarbonate) .... Take 2 By Mouth Once Daily 5)  Paroxetine Hcl 40 Mg  Tabs (Paroxetine Hcl) .... Take 1 Tablet By Mouth Once A Day 6)  Indapamide 1.25 Mg  Tabs (Indapamide) .... Take 1 and 1/2 By Mouth Once Daily 7)  Diazepam 2 Mg  Tabs (Diazepam) .... Take 1 By Mouth Once Daily As Needed 8)   Cromagen 1000mg  .... Take 1 Tablet By Mouth Once A Day 9)  Fish Oil .... Take 2  Tablets By Mouth Once A Day 10)  Viamins D, Calcuim and Mag .... Take 2  Tablets By Mouth Once A Day 11)  Astelin 137 Mcg/spray  Soln (Azelastine Hcl) .... Use As Directed 12)  Proair Hfa 108 (90 Base) Mcg/act  Aers (Albuterol Sulfate) .... 2 Puffs Four Times A Day As Needed 13)  Allergy Injection 1:10  At [facility] .... Once A Week 14)  Advair Diskus 250-50 Mcg/dose Misc (Fluticasone-Salmeterol) .Marland Kitchen.. 1 Puff and Rinse, Twice Daily 15)  Nasonex 50 Mcg/act Susp (Mometasone Furoate) .Marland Kitchen.. 1-2 Puffs Each Nostril Daily 16)  Vitamin C 1000 Mg Tabs (Ascorbic Acid) .... Take 1 By Mouth Once Daily 17)  Donepezil Hcl 5 Mg Tabs (Donepezil Hcl) .... Take 1 By Mouth Once Daily  Allergies: 1)  ! Asa 2)  Sulfamethoxazole (Sulfamethoxazole) 3)  Codeine Phosphate (Codeine Phosphate) 4)  Naprosyn (Naproxen)  Past History:  Past Medical History: Last updated: 08/01/2008 Hypertension Hyperlipidemia GER depression asthma rhinitis  Past Surgical History: Last updated: 04/05/2008 Lumbar disk fusion  Family History: Last updated: 02-10-08 Asthma   Mother- deceased age 70; cancer Father- deceaseda ge 31; cancer. allergies, and asthma Brother- deceased; kidney failure and heart failure Brother- deceased age 62's; emphysema 2 Sisters- living   Social History: Last updated: 10-Feb-2008 Patient never smoked.  Retired- office work Married; no children.  Risk Factors: Smoking Status: never (07/20/2007)  Review of Systems      See  HPI       The patient complains of hoarseness and prolonged cough.  The patient denies anorexia, fever, weight loss, weight gain, vision loss, decreased hearing, chest pain, syncope, dyspnea on exertion, peripheral edema, headaches, hemoptysis, abdominal pain, and severe indigestion/heartburn.    Vital Signs:  Patient profile:   77 year old female Height:      66 inches Weight:       161.50 pounds BMI:     26.16 O2 Sat:      99 % on Room air Pulse rate:   65 / minute BP sitting:   142 / 78  (left arm) Cuff size:   regular  Vitals Entered By: Reynaldo Minium CMA (October 20, 2009 1:36 PM)  O2 Flow:  Room air  Physical Exam  Additional Exam:  General: A/Ox3; pleasant and cooperative, NAD, WDWN elderly woman SKIN: no rash, lesions NODES: no lymphadenopathy HEENT: [place]/AT, EOM- WNL, Conjuctivae- clear, PERRLA, TM-WNL, Nose- clear, Throat- clear and wnl. No visible pndrip or stridor- not hoarse today to my ear.  Mallampati II NECK: Supple w/ fair ROM, JVD- none, normal carotid impulses w/o bruits Thyroid-  CHEST:without rhonchi or wheeze now, unlabored. slight cough. HEART: RRR, no m/g/r heard Abdomen- soft ZOX:WRUE, nl pulses, no edema  NEURO: Grossly intact to observation      Impression & Recommendations:  Problem # 1:  ALLERGIC RHINITIS (ICD-477.9)  She is concerned that persistent morning cough is an allergy issue. I think it may go back into the winter time as a low grade chronic bronchitis. We will bring her back to update her testing. Her updated medication list for this problem includes:    Astelin 137 Mcg/spray Soln (Azelastine hcl) ..... Use as directed    Nasonex 50 Mcg/act Susp (Mometasone furoate) .Marland Kitchen... 1-2 puffs each nostril daily  Problem # 2:  ASTHMA (ICD-493.90) Fair control. Meds reviewed. Symptoms suggestive of GERD were discussed for her education, to be paying attention.  Medications Added to Medication List This Visit: 1)  Zegerid 20-1100 Mg Caps (Omeprazole-sodium bicarbonate) .... Take 2 by mouth once daily 2)  Donepezil Hcl 5 Mg Tabs (Donepezil hcl) .... Take 1 by mouth once daily  Other Orders: Est. Patient Level III (45409)  Patient Instructions: 1)  Return as able for allergy swkin testing. -Stop all antihistamines 3 days before skin testing, including cold and allergy meds, otc sleep and cough meds. This includes the Astelin  nose spray. 2)  Try using sample Advair 115/21: 2 puffs and rinse, twice daily. 3)  Use this instead of your Advair discus to see if you have less trouble with raspy voice and cough. 4)  if you like it we can send in a prescription.

## 2010-08-11 NOTE — Progress Notes (Signed)
Summary: returned call  Phone Note Call from Patient Call back at Home Phone [phone]   Caller: Patient Call For: angela Summary of Call: pt returned call from angela. didn't know what this is regarding.  Initial call taken by: Tivis Ringer, CNA,  Nov 18, 2009 12:03 PM  Follow-up for Phone Call        reschedulled pt to an earlier appt time on 05/12/20111 Follow-up by: Eugene Gavia,  Nov 19, 2009 8:51 AM

## 2010-08-11 NOTE — Assessment & Plan Note (Signed)
Summary: Pulmonary/ asthma > HFA 50% p coaching   Copy to:  Dr. Vernon Prey Primary Provider/Referring Provider:  Vernon Prey Kritzer  CC:  Asthma.  History of Present Illness: 7 yowf with a h/o allergy/ asthma since age 77's worse since 2009   May 05, 2010 Pulmonary/ [place] cc indolent onset progressive worsening  cough and subj wheeze/ chest tightness more day than night. no purulent secretions. did not do well on advair discus but not sure how to use hfa.   Pt denies any significant sore throat, dysphagia, itching, sneezing,  nasal congestion or excess secretions,  fever, chills, sweats, unintended wt loss, pleuritic or exertional cp, hempoptysis, change in activity tolerance  orthopnea pnd or leg swelling Pt also denies any obvious fluctuation in symptoms with weather or environmental change or other alleviating or aggravating factors.       Current Medications (verified): 1)  Amlodipine Besylate 5 Mg Tabs (Amlodipine Besylate) .... Take 1 Tablet Once Daily 2)  Fenofibrate 160 Mg Tabs (Fenofibrate) .... Take 1 By Mouth Once Daily 3)  Vytorin 10-20 Mg  Tabs (Ezetimibe-Simvastatin) .... Take 1by Mouth Once Daily 4)  Omeprazole 20 Mg Cpdr (Omeprazole) .... Take 1 Tablet By Mouth Once A Day 5)  Paroxetine Hcl 40 Mg  Tabs (Paroxetine Hcl) .... Take 1 Tablet By Mouth Once A Day 6)  Indapamide 1.25 Mg  Tabs (Indapamide) .... Take 2 By Mouth Once Daily 7)  Diazepam 5 Mg Tabs (Diazepam) .... Take 1/2 By Mouth Once Daily 8)  Multigen 70 Mg Tabs (Fe-Succ-C-Thre-B12-Des Stomach) .... Take 1 By Mouth Once Daily 9)  Omega-3 350 Mg Caps (Omega-3 Fatty Acids) .... Take 1 Tablet By Mouth Once A Day 10)  Vitamin C 1000 Mg Tabs (Ascorbic Acid) .... Take 1 By Mouth Once Daily 11)  Proair Hfa 108 (90 Base) Mcg/act  Aers (Albuterol Sulfate) .... 2 Puffs Four Times A Day As Needed 12)  Nasonex 50 Mcg/act Susp (Mometasone Furoate) .Marland Kitchen.. 1-2 Puffs Each Nostril Daily 13)  Astepro 0.15 % Soln (Azelastine  Hcl) .Marland Kitchen.. 1-2 Puffs Each Nostril, Up To Twice Daily If Needed 14)  Advair Diskus 250-50 Mcg/dose Aepb (Fluticasone-Salmeterol) .... One Puff Twice Daily 15)  Donepezil Hcl 5 Mg Tabs (Donepezil Hcl) .... Take 1 By Mouth Once Daily 16)  Exelon 4.6 Mg/24hr Pt24 (Rivastigmine) .Marland Kitchen.. 1 Patch Every 24 Hours 17)  Lotemax 0.5 % Susp (Loteprednol Etabonate) .... Use As Directed 18)  Benicar 20 Mg Tabs (Olmesartan Medoxomil) .... Take 1 Tablet By Mouth Once A Day 19)  Cymbalta 30 Mg Cpep (Duloxetine Hcl) .... Take 1 Tablet By Mouth Once A Day 20)  Tylenol 325 Mg Tabs (Acetaminophen) .... As Needed 21)  Fish Oil 1200 Mg Caps (Omega-3 Fatty Acids) .... Take 1 Tablet By Mouth Once A Day 22)  Vitamin D3 2000 Unit Caps (Cholecalciferol) .... Takes High Potency Vitamind D Daily and 2000 Units Caps On Sat and Sunday 23)  Cal/mag Citrate 250-125 Mg Tabs (Calcium-Magnesium) .... Take 1 Tablet By Mouth Two Times A Day 24)  Vitamin E 400 Unit Caps (Vitamin E) .... Take 1 Tablet By Mouth Two Times A Day  Allergies (verified): 1)  ! Asa 2)  Sulfamethoxazole (Sulfamethoxazole) 3)  Codeine Phosphate (Codeine Phosphate) 4)  Naprosyn (Naproxen)  Past History:  Past Medical History: Hypertension Hyperlipidemia GER depression asthma    - HFA 25-> 50% p coaching rhinitis  Family History: Reviewed history from 01/22/2008 and no changes required. Asthma   Mother- deceased age  59; cancer Father- deceaseda ge 46; cancer. allergies, and asthma Brother- deceased; kidney failure and heart failure Brother- deceased age 28's; emphysema 2 Sisters- living   Social History: Reviewed history from 01/22/2008 and no changes required. 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  Retired- office work Married; no children.  Vital Signs:  Patient profile:   77 year old female Height:      66 inches Weight:      158 pounds O2 Sat:      99 % on Room air Temp:     96.8 degrees F oral Pulse rate:   73 / minute BP sitting:   122 / 60   (right arm) Cuff size:   regular  Vitals Entered By: Carron Curie CMA (May 05, 2010 11:52 AM)  O2 Flow:  Room air CC: Asthma Comments Medications reviewed with patient Carron Curie CMA  May 05, 2010 11:53 AM Daytime phone number verified with patient.    Physical Exam  Additional Exam:  General: A/Ox3; pleasant and cooperative, NAD, WDWN elderly woman wt 157 > 158 May 05, 2010  SKIN: no rash, lesions NODES: no lymphadenopathy HEENT: [place]/AT, EOM- WNL, Conjuctivae- clear, PERRLA, TM-WNL, Nose- pale, somewhat atrophic, clear mucus, Throat- clear and wnl. No visible pndrip or stridor- not hoarse today to my ear.  Mallampati II NECK: Supple w/ fair ROM, JVD- none, normal carotid impulses w/o bruits Thyroid-  CHEST:trace end exp wheeze HEART: RRR, no m/g/r heard Abdomen- soft HYQ:MVHQ, nl pulses, no edema  NEURO: Grossly intact to observation      Impression & Recommendations:  Problem # 1:  ASTHMA (ICD-493.90)   DDX of  difficult airways managment all start with A and  include Adherence, Ace Inhibitors, Acid Reflux, Active Sinus Disease, Alpha 1 Antitripsin deficiency, Anxiety masquerading as Airways dz,  ABPA,  allergy(esp in young), Aspiration (esp in elderly), Adverse effects of DPI,  Active smokers, plus one B  = Beta blocker use..    Adherence:  starts with understanding how to use her multiple prns for specific symtoms. reviewed approp use of saba, which is written correctly among the prns but not the way she's taking it.  I had an extended discussion with the patient today lasting 15 to 20 minutes of a 25 minute visit on the following issues:   I spent extra time with the patient today explaining optimal mdi  technique.  This improved from  25 -50%% p coaching.  Each maintenance medication was reviewed in detail including most importantly the difference between maintenance and as needed and under what circumstances the prns are to be used.   ? acid  relux consider d/c all oil based vitamins  Medications Added to Medication List This Visit: 1)  Omeprazole 20 Mg Cpdr (Omeprazole) .... Take 1 tablet by mouth once a day 2)  Omega-3 350 Mg Caps (Omega-3 fatty acids) .... Take 1 tablet by mouth once a day 3)  Proair Hfa 108 (90 Base) Mcg/act Aers (Albuterol sulfate) .... 2 puffs four times a day as needed 4)  Advair Diskus 250-50 Mcg/dose Aepb (Fluticasone-salmeterol) .... One puff twice daily 5)  Benicar 20 Mg Tabs (Olmesartan medoxomil) .... Take 1 tablet by mouth once a day 6)  Cymbalta 30 Mg Cpep (Duloxetine hcl) .... Take 1 tablet by mouth once a day 7)  Tylenol 325 Mg Tabs (Acetaminophen) .... As needed 8)  Fish Oil 1200 Mg Caps (Omega-3 fatty acids) .... Take 1 tablet by mouth once a day 9)  Vitamin D3 2000 Unit  Caps (Cholecalciferol) .... Takes high potency vitamind d daily and 2000 units caps on sat and sunday 10)  Cal/mag Citrate 250-125 Mg Tabs (Calcium-magnesium) .... Take 1 tablet by mouth two times a day 11)  Vitamin E 400 Unit Caps (Vitamin e) .... Take 1 tablet by mouth two times a day  Other Orders: Est. Patient Level IV (16109) HFA Instruction [phone])  Patient Instructions: 1)  Use advair 2 puffs first thing  in am and 2 puffs again in pm about 12 hours later  2)  Only use proaire if needed for breathless, wheezing, coughing or congestion but only use if needed  3)  Follow up is either here in [place] or by Dr Maple Hudson in [place] office    Immunization History:  Influenza Immunization History:    Influenza:  historical (04/13/2010)  Pneumovax Immunization History:    Pneumovax:  historical (03/16/2010)

## 2010-08-11 NOTE — Miscellaneous (Signed)
Summary: New Vaccine/[facility]  New Vaccine/[facility]   Imported By: Sherian Rein 03/12/2010 08:54:16  _____________________________________________________________________  External Attachment:    Type:   Image     Comment:   External Document

## 2010-08-12 ENCOUNTER — Ambulatory Visit: Payer: Medicare Other | Attending: Orthopedic Surgery | Admitting: Physical Therapy

## 2010-08-12 DIAGNOSIS — M25559 Pain in unspecified hip: Secondary | ICD-10-CM | POA: Insufficient documentation

## 2010-08-12 DIAGNOSIS — IMO0001 Reserved for inherently not codable concepts without codable children: Secondary | ICD-10-CM | POA: Insufficient documentation

## 2010-08-12 DIAGNOSIS — R5381 Other malaise: Secondary | ICD-10-CM | POA: Insufficient documentation

## 2010-08-13 NOTE — Assessment & Plan Note (Signed)
Summary: 1 month/cb   Copy to:  Dr. Vernon Prey Primary Provider/Referring Provider:  Vernon Prey Kritzer  CC:  Follow up visit-asthma; recent hospital stay.Marland Kitchen  History of Present Illness: March 19, 2010- Allergic rhinitis, asthma Dr [facility] office in [place] is not willing to administer her allergy vaccine during the build-up. She can't afford to come here regularly for her allergy shots. Discussing options, I suggested we stop allergy shots now and work with medicines.  Main c/o- drippy nose, stuffiness, sneeze, postnasal drip. Eyes ok.. No itch. 1 episode bronchitis recently.  June 18, 2010-  Allergic rhinitis, asthma Saw Dr Sherene Sires for acute visit in [place] in October. He worked on her inhaler technique. She did not change from the Advair diskus. Coughs up a little clear or yellow each day.  Not sure if Astepro useful. Nose only runs if working outside. She gets unsure when to use her Proair and we discussed rescue vs maintenance meds.  July 24, 2010- Allergic rhinitis, asthma Nurse-CC: Follow up visit-asthma; recent hospital stay. CXR 06/18/10- NAD and clear - reviewed Hosp 06/23/10 for uncontrolled GERD and following with Dr Christell Constant for that, still some xiphoid heartburn. Otherwise says breathing and allergy control is good. Denies waking with reflux, coughing or choking.        Asthma History    Asthma Control Assessment:    Age range: 12+ years    Symptoms: 0-2 days/week    Nighttime Awakenings: 0-2/month    Interferes w/ normal activity: no limitations    SABA use (not for EIB): 0-2 days/week    FEV1: 2.15 liters (today)    Asthma Control Assessment: Well Controlled   Preventive Screening-Counseling & Management  Alcohol-Tobacco     Smoking Status: never  Current Medications (verified): 1)  Amlodipine Besylate 10 Mg Tabs (Amlodipine Besylate) .... Take 1 By Mouth Once Daily 2)  Fenofibrate 160 Mg Tabs (Fenofibrate) .... Take 1 By Mouth Once Daily 3)  Vytorin  10-20 Mg  Tabs (Ezetimibe-Simvastatin) .... Take 1by Mouth Once Daily 4)  Indapamide 1.25 Mg  Tabs (Indapamide) .... Take 2 By Mouth Once Daily 5)  Diazepam 5 Mg Tabs (Diazepam) .... Take 1/2 By Mouth Once Daily 6)  Multigen 70 Mg Tabs (Fe-Succ-C-Thre-B12-Des Stomach) .... Take 1 By Mouth Once Daily 7)  Vitamin C 1000 Mg Tabs (Ascorbic Acid) .... Take 1 By Mouth Once Daily 8)  Proair Hfa 108 (90 Base) Mcg/act  Aers (Albuterol Sulfate) .... 2 Puffs Four Times A Day As Needed 9)  Advair Diskus 250-50 Mcg/dose Aepb (Fluticasone-Salmeterol) .... One Puff Twice Daily 10)  Benicar 40 Mg Tabs (Olmesartan Medoxomil) .... Take 1 By Mouth Once Daily 11)  Tylenol 325 Mg Tabs (Acetaminophen) .... As Needed 12)  Fish Oil 1200 Mg Caps (Omega-3 Fatty Acids) .... Take 1 Tablet By Mouth Once A Day 13)  Vitamin D 1000 Unit Tabs (Cholecalciferol) .... Takes High Potency Vitamind D Daily and 2000 Units Caps On Sat and Sunday 14)  Cal/mag Citrate 250-125 Mg Tabs (Calcium-Magnesium) .... Take 1 Tablet By Mouth Two Times A Day 15)  Cvs Easy Fiber  Pack (Corn Dextrin) .... Take 4 By Mouth  Every Morning 16)  Stool Softener 100 Mg Caps (Docusate Sodium) .... Take 4 By Mouth At Bedtime 17)  Cymbalta 60 Mg Cpep (Duloxetine Hcl) .... Take 1 By Mouth Once Daily 18)  Pantoprazole Sodium 40 Mg Tbec (Pantoprazole Sodium) .... Take 1 By Mouth Two Times A Day 19)  Nasonex 50 Mcg/act Susp (  Mometasone Furoate) .Marland Kitchen.. 1-2 Sprays in Each Nostril Once Daily 20)  Astepro 0.15 % Soln (Azelastine Hcl) .Marland Kitchen.. 1-2 Sprays in Each Nostril Up To Two Times A Day As Needed  Allergies (verified): 1)  ! Asa 2)  Sulfamethoxazole (Sulfamethoxazole) 3)  Codeine Phosphate (Codeine Phosphate) 4)  Naprosyn (Naproxen)  Past History:  Past Medical History: Last updated: 05/05/2010 Hypertension Hyperlipidemia GER depression asthma    - HFA 25-> 50% p coaching rhinitis  Past Surgical History: Last updated: 04/05/2008 Lumbar disk  fusion  Family History: Last updated: Feb 03, 2008 Asthma   Mother- deceased age 7; cancer Father- deceaseda ge 45; cancer. allergies, and asthma Brother- deceased; kidney failure and heart failure Brother- deceased age 63's; emphysema 2 Sisters- living   Social History: Last updated: 02-03-2008 Patient never smoked.  Retired- office work Married; no children.  Risk Factors: Smoking Status: never (07/24/2010)  Review of Systems      See HPI       The patient complains of acid heartburn and indigestion.  The patient denies shortness of breath with activity, shortness of breath at rest, productive cough, non-productive cough, coughing up blood, chest pain, irregular heartbeats, loss of appetite, weight change, abdominal pain, difficulty swallowing, sore throat, tooth/dental problems, headaches, nasal congestion/difficulty breathing through nose, and sneezing.    Vital Signs:  Patient profile:   77 year old female Height:      66 inches Weight:      158.50 pounds BMI:     25.68 O2 Sat:      96 % on Room air Pulse rate:   68 / minute BP sitting:   118 / 62  (left arm) Cuff size:   regular  Vitals Entered By: Reynaldo Minium CMA (July 24, 2010 2:27 PM)  O2 Flow:  Room air CC: Follow up visit-asthma; recent hospital stay.   Physical Exam  Additional Exam:  General: A/Ox3; pleasant and cooperative, NAD, WDWN elderly woman wt 157 > 158 May 05, 2010  SKIN: no rash, lesions NODES: no lymphadenopathy HEENT: [place]/AT, EOM- WNL, Conjuctivae- clear, PERRLA, TM-WNL, Nose- pale, somewhat atrophic, clear mucus, Throat- clear and wnl. No visible pndrip or stridor-.  Mallampati II, maybe slight hoarseness NECK: Supple w/ fair ROM, JVD- none, normal carotid impulses w/o bruits Thyroid-  CHEST:no crackles heard this visit, lungs are clear to P&A HEART: RRR, no m/g/r heard Abdomen- soft WGN:FAOZ, nl pulses, no edema  NEURO: Grossly intact to  observation      Pre-Spirometry FEV1    Value: 2.15 L     Impression & Recommendations:  Problem # 1:  ASTHMA (ICD-493.90) Currently good control.  CXR 06/28/10 was clear, NAD and stable compared w/ 2008.  Problem # 2:  ESOPHAGEAL REFLUX (ICD-530.81) We discussed relation of reflux disease to symptoms of cough, wheeze and potential for aspiration. She is working on reflux precautions. The following medications were removed from the medication list:    Pepcid Ac 10 Mg Tabs (Famotidine) .Marland Kitchen... Take 1 by mouth  every morning with full glass water Her updated medication list for this problem includes:    Pantoprazole Sodium 40 Mg Tbec (Pantoprazole sodium) .Marland Kitchen... Take 1 by mouth two times a day  Problem # 3:  ALLERGIC RHINITIS (ICD-477.9)  Her updated medication list for this problem includes:    Nasonex 50 Mcg/act Susp (Mometasone furoate) .Marland Kitchen... 1-2 sprays in each nostril once daily    Astepro 0.15 % Soln (Azelastine hcl) .Marland Kitchen... 1-2 sprays in each nostril up to two times  a day as needed  Orders: Est. Patient Level III ([address])  Medications Added to Medication List This Visit: 1)  Vitamin D 1000 Unit Tabs (Cholecalciferol) .... Takes high potency vitamind d daily and 2000 units caps on sat and sunday 2)  Pantoprazole Sodium 40 Mg Tbec (Pantoprazole sodium) .... Take 1 by mouth two times a day 3)  Nasonex 50 Mcg/act Susp (Mometasone furoate) .Marland Kitchen.. 1-2 sprays in each nostril once daily 4)  Astepro 0.15 % Soln (Azelastine hcl) .Marland Kitchen.. 1-2 sprays in each nostril up to two times a day as needed  Patient Instructions: 1)  Please schedule a follow-up appointment in 6 months. Please call sooner as needed 2)  cc Dr Christell Constant

## 2010-08-13 NOTE — Miscellaneous (Signed)
Summary: CONSULTATION  NAMEMALEAH, Traci Wheeler              ACCOUNT NO.:  [id number]      MEDICAL RECORD NO.:  [id number]          PATIENT TYPE:  INP      LOCATION:  A315                          FACILITY:  [facility]      PHYSICIAN:  Jonette Eva, M.D.     DATE OF BIRTH:  July 19, 1927      DATE OF CONSULTATION:  06/22/2010   DATE OF DISCHARGE:                                    CONSULTATION         REASON FOR CONSULT:  Epigastric pain, nausea and vomiting.      HISTORY OF PRESENT ILLNESS:  Traci Wheeler is a pleasant 77 year old   Caucasian female who had an acute onset of epigastric pain on Friday,   rated as 9/10, unable to characterize the pain.  Traci Wheeler was seen at her   primary care doctor's office who gave her Dexilant.  Traci Wheeler did take a pill   but then started to have nausea and vomiting on Saturday.  Positive   chills.  No fever.  No hematemesis.  On Sunday Traci Wheeler reports resolution of   epigastric discomfort.  Traci Wheeler does state that eating worsens the pain.   Traci Wheeler denies any epigastric pain currently.  Traci Wheeler does state that Traci Wheeler is   empty.  Traci Wheeler does admit to a bowel movement every day, sometimes twice   a day.  No blood noted in stool.  Traci Wheeler denies the use of aspirin or   NSAIDs.  Traci Wheeler denies any dysphagia or odynophagia.      PAST MEDICAL HISTORY:  Reflux, hypertension, hypercholesterolemia,   asthma.      PAST SURGICAL HISTORY:  Back surgery x2, breast biopsy x2 bilaterally.   Traci Wheeler has also had an endoscopy and colonoscopy in 2009 in [place] by Dr.   Eulah Pont.  Cataract surgery and hip arthroscopy.      SOCIAL HISTORY:  Traci Wheeler denies smoking or drinking.  Traci Wheeler denies any illicit   drug use.  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  Traci Wheeler is married without   any children.  Traci Wheeler has been married for 48 years.      FAMILY HISTORY:  Mother is deceased, history of liver cancer.  Father is   deceased, had history of asthma.  There is some sort of questionable   diagnosis of cancer with him as well.  Traci Wheeler is  unsure what.  Denies any   family history of colon cancer.      REVIEW OF SYSTEMS:  Negative except as mentioned in the HPI.      ALLERGIES:  Traci Wheeler is allergic to SULFA, ASPIRIN, CODEINE, NSAIDs.      CURRENT MEDICATIONS:  For this admission include albuterol, Norvasc,   valium, Flonase, Advair, Benicar, pantoprazole 40 mg IV every 12 hours,   Tylenol, morphine, Zofran, Phenergan.      PHYSICAL EXAM:  VITAL SIGNS:  BP 178/72, pulse 76, respirations 17,   temperature 98.1, 95% on room air.   GENERAL:  Traci Wheeler is in no acute distress.  Traci Wheeler does tend to be quite  talkative and rambles with slight short-term memory deficits.  However,   Traci Wheeler is able to redirect herself back to the topic at hand.   HEENT:  Sclerae without any icterus.  Mucous membranes are dry.  There   is no thyromegaly noted.  No lymphadenopathy.   CHEST:  Clear to auscultation bilaterally.  No rales, rhonchi.   CARDIAC:  Regular rate and rhythm.  S1-S2 noted.   ABDOMEN:  Abdomen is soft with mild epigastric tenderness.  No guarding   or rebound noted.  There is an umbilical hernia that is easily   reducible.   EXTREMITIES:  Without edema +2 dorsalis pedis bilaterally.   NEUROLOGICAL:  Traci Wheeler is alert and oriented.  As noted, some short-term   memory deficits.      Laboratory data:  White count is 6, H and H is 12.4 and 36.8, platelets   320, potassium 3.3, sodium 140, BUN is 10, creatinine 0.91, calcium 9.3.   LFTs are all normal.  Lipase 25.      Radiological data:  CT abdomen and pelvis December 11 showed omentum   containing ventral hernia probably umbilical or periumbilical, a   nonobstructing left upper pole renal calculus and a tiny right   interpolar renal cortical cyst, 12 mm right inferior hepatic lobe cystic   lesion also likely simple cyst, postoperative changes and degenerative   disease in lumbar spine.  Chest x-ray showed no active disease.      ASSESSMENT AND PLAN:  Traci Wheeler is a pleasant 77 year old white  female   with an acute onset of epigastric pain approximately 3 days ago with the   onset of nausea and vomiting 2 days ago.  Traci Wheeler does admit to some   improvement in the epigastric discomfort.  Traci Wheeler does have a history of   taking Pepcid at home and denies any use of NSAIDs or aspirin or Goody   powder.  Differential diagnosis includes gastritis versus peptic ulcer   disease, doubt a biliary component, however, this cannot be completely   ruled out.      RECOMMENDATIONS:   1. We will need to obtain the reports from the endoscopy and       colonoscopy done in 2009.   2. Follow up on the ultrasound of abdomen that will be done today to       rule out any gallstones.   3. Continue proton pump inhibitor and this will need to be switched to       p.o. when Traci Wheeler is able to advance her diet.   4. No need for endoscopic evaluation at this time.      We would like to thank Dr. Derenda Mis for this referral.            ______________________________   Gerrit Halls, ANP-BC         ______________________________   Jonette Eva, M.D.            AS/MEDQ  D:  06/22/2010  T:  06/22/2010  Job:  [id number]      cc:   Ernestina Penna, M.D.   Fax: [phone]      Melissa L. Ladona Ridgel, MD      Electronically Signed by Gerrit Halls  on 06/24/2010 01:09:09 PM   Electronically Signed by Jonette Eva M.D. on 07/23/2010 08:09:03 PM  Clinical Lists Changes

## 2010-09-17 ENCOUNTER — Encounter (INDEPENDENT_AMBULATORY_CARE_PROVIDER_SITE_OTHER): Payer: Self-pay | Admitting: *Deleted

## 2010-09-22 LAB — CBC
HCT: 36.8 % (ref 36.0–46.0)
Hemoglobin: 12.7 g/dL (ref 12.0–15.0)
MCH: 31.2 pg (ref 26.0–34.0)
MCH: 32.2 pg (ref 26.0–34.0)
MCV: 92.5 fL (ref 78.0–100.0)
Platelets: 333 10*3/uL (ref 150–400)
RBC: 3.95 MIL/uL (ref 3.87–5.11)
RDW: 13.6 % (ref 11.5–15.5)
WBC: 6 10*3/uL (ref 4.0–10.5)

## 2010-09-22 LAB — COMPREHENSIVE METABOLIC PANEL
ALT: 19 U/L (ref 0–35)
AST: 32 U/L (ref 0–37)
Albumin: 3.9 g/dL (ref 3.5–5.2)
CO2: 23 mEq/L (ref 19–32)
Calcium: 10 mg/dL (ref 8.4–10.5)
Chloride: 104 mEq/L (ref 96–112)
Creatinine, Ser: 0.95 mg/dL (ref 0.4–1.2)
GFR calc Af Amer: >60 mL/min (ref >60)
Sodium: 138 mEq/L (ref 135–145)
Total Bilirubin: 0.6 mg/dL (ref 0.3–1.2)

## 2010-09-22 LAB — DIFFERENTIAL
Basophils Absolute: 0 10*3/uL (ref 0.0–0.1)
Eosinophils Relative: 1 % (ref 0–5)
Eosinophils Relative: 2 % (ref 0–5)
Lymphocytes Relative: 30 % (ref 12–46)
Lymphs Abs: 1.2 10*3/uL (ref 0.7–4.0)
Monocytes Absolute: 0.5 10*3/uL (ref 0.1–1.0)
Monocytes Absolute: 0.7 10*3/uL (ref 0.1–1.0)
Monocytes Relative: 11 % (ref 3–12)
Neutro Abs: 2.3 10*3/uL (ref 1.7–7.7)

## 2010-09-22 LAB — CARDIAC PANEL(CRET KIN+CKTOT+MB+TROPI)
CK, MB: 2.9 ng/mL (ref 0.3–4.0)
CK, MB: 2.9 ng/mL (ref 0.3–4.0)
CK, MB: 3 ng/mL (ref 0.3–4.0)
Relative Index: INVALID (ref 0.0–2.5)
Total CK: 92 U/L (ref 7–177)
Total CK: 96 U/L (ref 7–177)
Troponin I: 0.01 ng/mL (ref 0.00–0.06)

## 2010-09-22 LAB — URINE CULTURE

## 2010-09-22 LAB — URINALYSIS, ROUTINE W REFLEX MICROSCOPIC
Bilirubin Urine: NEGATIVE
Ketones, ur: NEGATIVE mg/dL
Nitrite: NEGATIVE
Protein, ur: NEGATIVE mg/dL
Urobilinogen, UA: 0.2 mg/dL (ref 0.0–1.0)
pH: 8.5 — ABNORMAL HIGH (ref 5.0–8.0)

## 2010-09-22 LAB — BASIC METABOLIC PANEL
BUN: 10 mg/dL (ref 6–23)
CO2: 23 mEq/L (ref 19–32)
Chloride: 108 mEq/L (ref 96–112)
Glucose, Bld: 80 mg/dL (ref 70–99)
Potassium: 3.3 mEq/L — ABNORMAL LOW (ref 3.5–5.1)

## 2010-09-22 LAB — LIPID PANEL
Cholesterol: 141 mg/dL (ref 0–200)
LDL Cholesterol: 67 mg/dL (ref 0–99)
VLDL: 18 mg/dL (ref 0–40)

## 2010-09-22 LAB — POCT CARDIAC MARKERS
CKMB, poc: 1.4 ng/mL (ref 1.0–8.0)
Myoglobin, poc: 51.1 ng/mL (ref 12–200)
Troponin i, poc: 0.05 ng/mL (ref 0.00–0.09)

## 2010-09-22 LAB — APTT: aPTT: 39 seconds — ABNORMAL HIGH (ref 24–37)

## 2010-09-22 LAB — LACTIC ACID, PLASMA: Lactic Acid, Venous: 1.2 mmol/L (ref 0.5–2.2)

## 2010-09-22 NOTE — Letter (Addendum)
Summary: Recall Office Visit  [facility]  [address]   [place], [place] [address]   Phone: [phone]  Fax: [phone]      September 17, 2010   Traci Wheeler [address] [place], [place]  [address] May 09, 1928   Dear Ms. Traci Wheeler,   According to our records, it is time for you to schedule a follow-up office visit with [place].   At your convenience, please call [phone] to schedule an office visit. If you have any questions, concerns, or feel that this letter is in error, we would appreciate your call.   Sincerely,    Traci Wheeler  [facility] Ph: [phone]   Fax: [phone]  Appended Document: Recall Office Visit SEEING ANOTHER GI DR. SHE GOES TO [place] TO DR. Eulah Pont AND DONT NEED TO F/U HERE RIGHT NOW

## 2010-11-09 ENCOUNTER — Encounter: Payer: Self-pay | Admitting: Family Medicine

## 2010-11-10 ENCOUNTER — Encounter: Payer: Self-pay | Admitting: Family Medicine

## 2010-11-24 NOTE — Assessment & Plan Note (Signed)
[facility]                             PULMONARY OFFICE NOTE   Traci Wheeler, Traci Wheeler                       MRN:          [id number]  DATE:01/17/2007                            DOB:          [birth year]    PROBLEMS:  1. Asthma.  2. Allergic rhinitis.  3. Esophageal reflux.   HISTORY:  She is pending lumbar spine surgery with Dr. Gerlene Fee.  Breathing is comfortable at this time in summer weather.  She had recent  increased nasal congestion and postnasal drip without any sense that she  had an infection.  She continues allergy vaccine at 1:10.  I pointed out  that this is not a peak pollen season and with no particular exposure,  it may be this is an air quality irritation of her nasopharynx rather  than an allergic response.   MEDICATIONS:  1. Amlodipine 7.5 mg.  2. Tricor 145 mg.  3. Vytorin 10/20.  4. Zegerid 40 mg.  5. Advair 100/50.  6. Astelin p.r.n.  7. Paroxetine 40 mg.  8. Allergy vaccine.  9. Dapamide 125 mg x1/2.  10.She has some Diazepam 5 mg for p.r.n. use.   Drug intolerant to SULFA, CODEINE.  She avoids ASPIRIN and NONSTEROIDALS  because of GI upset.   OBJECTIVE:  Weight 174 pounds.  BP 150/84.  Pulse 67.  Room air  saturation 96%.  There is mucus bridging.  She is holding a Kleenex, but  I do not see postnasal drainage.  There is no conjunctival injection or  periorbital edema.  LUNGS:  Clear.  Heart sounds are normal.  She actually looks quite comfortable.   IMPRESSION:  Asthma, allergic rhinitis, nonspecific rhinitis.   There have been previous chest x-rays, question of a left lower lobe  density needing followup and chest x-ray done this date describes no  active lung disease.  Normal heart size.  No acute process.  Results  have been shared with her.   PLAN:  Schedule return in six months, earlier p.r.n.     Clinton D. Maple Hudson, MD, Tonny Bollman, FACP  Electronically Signed    CDY/MedQ  DD: 02/12/2007  DT: 02/12/2007  Job  #: [id number]   cc:   Ernestina Penna, M.D.

## 2010-11-24 NOTE — Op Note (Signed)
NAMEWAVER, DIBIASIO NO.:  [id number]   MEDICAL RECORD NO.:  [id number]          PATIENT TYPE:  INP   LOCATION:  3030                         FACILITY:  [facility]   PHYSICIAN:  Reinaldo Meeker, M.D. DATE OF BIRTH:  06-03-1928   DATE OF PROCEDURE:  02/23/2007  DATE OF DISCHARGE:  02/24/2007                               OPERATIVE REPORT   PREOPERATIVE DIAGNOSIS:  Spondylolisthesis with stenosis L3-4.   POSTOPERATIVE DIAGNOSIS:  Spondylolisthesis with stenosis L3-4.   PROCEDURE:  Bilateral L3-4 interlaminar laminotomies for decompression  of L4 nerve roots bilaterally followed by posterior instrumentation non  segmental L3-4 with SPIRE plate fixation.   SURGEON:  Dr. Gerlene Fee.   ASSISTANT:  Dr. Marikay Alar.   PROCEDURE IN DETAIL:  After placed in the prone position, the patient's  back was prepped and draped in usual sterile fashion.  Localizing x-ray  was taken prior to incision to identify the appropriate level.  Midline  incision was made above the spinous processes of L3 and L4.  Subperiosteal dissection was then carried out bilaterally along the  spinous processes and lamina and facet joint.  Self-retaining retractor  was placed for exposure.  X-rays showed approach to the appropriate  level.  Starting on the patient's left side laminotomy was performed by  removing the inferior two-thirds of the L4 lamina, medial third of the  facet joint and the superior one-third of the L4 lamina.  Residual bone  and ligamentum flavum removed in a piecemeal fashion.  Foraminotomy of  the L4 nerve root was then carried out until it was well decompressed.  Some proximal decompression was then carried out to make sure the thecal  sac was well decompressed proximally.  Similar decompression was then  carried out on the patient's right side, once again removing the  inferior three-quarters of the L3 lamina, medial third of the facet  joint and the superior one-half of the L4  lamina.  Once again residual  bone and ligamentum flavum were removed in a piecemeal fashion.  At this  time, inspection was carried out at both levels for any evidence of  residual compression and none could be identified.  A 35-mm SPIRE plate  was then chosen.  A small opening was made between the spinous processes  and the plate was passed through and then the opposite side clamped  down.  Sequential clamping was then carried out to compress the plate in  both directions and the top loading screw was then loaded and twisted  until it sheared off in the appropriate fashion.  Final x-ray showed  good placement of the SPIRE plate.  Large amounts of irrigation were  carried out bilaterally and any bleeding controlled with bipolar  coagulation and Gelfoam.  The wound was then closed in multiple layers  of Vicryl in the muscle fascia, subcutaneous subcuticular tissues and  staples were placed on the skin.  Sterile dressings were then applied  and the patient was extubated and taken to the recovery room in stable  condition.           ______________________________  Harvie Heck  Sonda Primes, M.D.     ROK/MEDQ  D:  02/23/2007  T:  02/24/2007  Job:  [id number]

## 2010-11-24 NOTE — Assessment & Plan Note (Signed)
[facility]                            CARDIOLOGY OFFICE NOTE   NEFTALY, INZUNZA                       MRN:          [id number]  DATE:06/05/2008                            DOB:          [birth year]    PRIMARY CARE PHYSICIAN:  Ernestina Penna, MD   REASON FOR PRESENTATION:  Evaluate the patient with abnormal  cardiovascular study.   HISTORY OF PRESENT ILLNESS:  The patient is a pleasant 77 year old  without prior cardiac history.  However, she has had some recurrent  bronchitis and had a CT of her chest recently ordered by Dr. Maple Hudson.  This demonstrated coronary calcification.  The patient is limited in her  activities mostly because of back problems.  She is able to do a little  bit of work such as vacuuming or some household chores.  For a long  while, she has had chest discomfort.  This does happen with the  exertion.  She has thought it was a muscle spasm.  She has been treated  with Valium for years.  Starts in her left back and axilla and radiates  around in her left chest.  It goes away spontaneously.  It has been a  relatively stable pattern.  She does not describe any associated nausea,  vomiting, or diaphoresis.  She does not have any palpitation,  presyncope, or syncope.  She does get dyspneic with moderate exertion  but this has been chronic.  She does not have any resting shortness of  breath.  Denies any PND or orthopnea.  She was referred because of the  combination of chest discomfort along with cardiovascular risk factors  and the coronary calcification.   PAST MEDICAL HISTORY:  Hypertension since 1982, hyperlipidemia times  years, depression.   PAST SURGICAL HISTORY:  Back surgery x2, right and left breast biopsy,  ganglion cyst resected, hip arthroscopy, left and right cataracts.   ALLERGIES:  CODEINE, ASPIRIN, and SULFA.   MEDICATIONS:  1. Amlodipine 7.5 mg daily.  2. Indapamide 1.5 mg daily.  3. TriCor 145 mg daily.  4.  Vytorin 10/20 daily.  5. Zegerid 40/1100 daily.  6. Paroxetine 40 mg daily.  7. Albuterol.  8. Advair.  9. Allergy shots.  10.Diazepam.  11.Iron.  12.Fish oil.  13.Vitamin D.  14.Calcium.   SOCIAL HISTORY:  The patient is retired.  She is married.  Her husband  is my patient.  They have no children.  She has never smoked cigarettes  and does not drink alcohol.   FAMILY HISTORY:  Contributory for later-onset heart disease in her  brothers.   REVIEW OF SYSTEMS:  As stated in the HPI, positive for mild asthma  diagnosed in the past, reflux, emotional stress, recent right carpal  tunnel aggravation.  Otherwise, negative for all other systems.   PHYSICAL EXAMINATION:  GENERAL:  The patient is pleasant and in no  distress.  VITAL SIGNS:  Blood pressure 138/16, heart rate 72 and regular, weight  170 pounds, body mass index 28.  HEENT:  Eyelids are unremarkable.  Pupils equal, round, and reactive to  light.  Fundi not visualized.  Oral mucosa unremarkable.  NECK:  No jugular venous distention at 45 degrees.  Carotid upstroke  brisk and symmetric, no bruits, no thyromegaly.  LYMPHATICS:  No cervical, axillary, or inguinal adenopathy.  LUNGS:  Clear to auscultation bilaterally.  BACK:  No costovertebral angle tenderness.  CHEST:  Unremarkable.  HEART:  PMI not displaced or sustained, S1 and S2 within normal limits.  No S3, no S4, no clicks, no rubs, no murmurs.  ABDOMEN:  Flat, positive bowel sounds normal in frequency and pitch, no  bruits, no rebound, no guarding, no midline pulsatile mass, no  hepatomegaly, no splenomegaly.  SKIN:  No rashes, no nodules.  EXTREMITIES:  Pulses 2+ throughout, no edema, no cyanosis, no clubbing.  NEURO:  Oriented to person, place, and time.  Cranial nerves II through  XII grossly intact, motor grossly intact throughout.   EKG sinus rhythm, right bundle-branch block, leftward axis, no acute ST-  T wave changes, premature ventricular  contraction.   ASSESSMENT AND PLAN:  1. Coronary calcification and chest pain.  The patient has vascular      disease with obvious coronary plaque.  The question is to what      degree.  She does have chest pain and she is not able to be      particularly active.  The pretest probability of obstructive      coronary disease is moderately high.  Stress perfusion imaging is      indicated.  I do think she would be able to walk on a treadmill and      so we will do an exercise Myoview.  If not, she can be converted to      adenosine.  2. Hypertension per Dr. Christell Constant.  Her blood pressure is well controlled.  3. Dyslipidemia per Dr. Christell Constant.  She is on therapy and is having this      closely followed.  4. Dyspnea.  She does have some lung disease.  We will assess her      ejection fraction and      consider further evaluation after this.  5. Followup.  I will see her back based on the results of the above.     Rollene Rotunda, MD, [facility]  Electronically Signed    JH/MedQ  DD: 06/05/2008  DT: 06/05/2008  Job #: [id number]   cc:   Ernestina Penna, M.D.

## 2010-11-24 NOTE — Op Note (Signed)
Traci Wheeler, Traci Wheeler NO.:  [id number]   MEDICAL RECORD NO.:  [id number]          PATIENT TYPE:  INP   LOCATION:  3032                         FACILITY:  [facility]   PHYSICIAN:  Reinaldo Meeker, M.D. DATE OF BIRTH:  October 05, 1927   DATE OF PROCEDURE:  01/30/2007  DATE OF DISCHARGE:                               OPERATIVE REPORT   PREOPERATIVE DIAGNOSIS:  Grade 1 spondylolisthesis at L3-4.   POSTOPERATIVE DIAGNOSIS:  Grade 1 spondylolisthesis at L3-4.   PROCEDURE:  L3-4 extreme lateral interbody fusion with Corelink  interbody cage, followed by nonsegmental instrumentation at L3-4 with  lateral invasive plate.   SURGEON:  Reinaldo Meeker, M.D.   ASSISTANT:  Kathaleen Maser. Pool, M.D.   PROCEDURE IN DETAIL:  After being placed in the right side up lateral  position, the patient's right flank and buttock region were prepped and  draped in the usual sterile fashion.  Localizing fluoroscopy was brought  into the field to confirm a clear lateral and AP image, which was well  accomplished.  The L3-4 disk was then identified.  The linear mark was  made on the skin.  Two incisions were then made; the one posterior to  midline was carried down through the superficial and deep fascia, until  the retroperitoneal space was identified.  Adhesions in that the area  were swabbed clean and the retroperitoneal contents were swept away  without difficulty.  The second incision was then opened and carried  down through the fascia, and then the finger from the first incision was  used to guide through the second incision into the retroperitoneal  space.  The pencil dilator was then passed down without difficulty to  the psoas muscle.  Sequential dilators were then passed and at the same  time intraoperative EMG monitoring was used to make sure there was no  irritation of the lumbosacral plexus.  A docking K-wire was used to hold  within the interspace.  The Maxxus retractor was then placed  over the  last dilator, and the dilators were brought out and the K-wire was left  in place.  The Maxxus retractor was then attached to the table and  sequentially opened in all directions, with some adjustments to help [place]  with our exposure.  The intradiskal shim was then placed in order to  secure the retractor, and the K-wire was removed.  The disk was then  incised after testing had been done in all quadrants of the exposure, to  make sure that there was no evidence of neural stimulation; none was  identified.  The disk was then thoroughly cleaned out with the biting  instruments.  Cobb elevators were then scraped along the endplates, to  take off the disk material.  This was actually passed through the far  annulus, which was part of the procedure.  This was done along the  inferior endplate of L3 and the superior endplate of L4.  This was all  followed under AP fluoroscopy.  Sequential dilatation was then carried  up, until a 10 mm trial was in place; this was found  to be, what  appeared to be, good.  A trial implant was then used and found to be a  good fit.  A 45 mm width was then used. This was then filled with a  mixture of Protonix putty and BMP; and then hammered into place without  difficulty under AP and lateral fluoroscopy, which all showed them to be  in good position.  This was hammered all the way across the midline  until the far cortical region.  The holder was then disengaged and the  implant appeared to be in excellent position in both planes.  An  appropriately sized lateral plate was then chosen, and the guide was  placed down for placement of the 2 screws.  Drilling was carried out and  followed under fluoroscopic guidance, followed by placing of 45 mm  screws at L3 and L4.  The appropriately sized plate was then placed over  the screws and top loading nuts were secured.  These were done with the  initial tightening, and then done with the final tightener; and this  was  tightened down using the torque device until it was completely tight.  The retractor was then removed, after large amounts of irrigation were  carried out.  Pictures in AP and lateral fluoroscopy showed the  interbody spacer, along with the lateral plate and screws, to be in good  position.  The listhesis had been markedly reduced by the procedure as  well.  At this time further irrigation was carried out, and the wounds  were closed with interrupted Vicryl on the fascia, subcutaneous and  subcuticular tissues.  Dermabond was placed on the skin.  A sterile  dressing was then applied.  The patient was extubated and taken to the  recovery room in stable condition.           ______________________________  Reinaldo Meeker, M.D.     ROK/MEDQ  D:  01/30/2007  T:  01/30/2007  Job:  [id number]

## 2010-12-18 ENCOUNTER — Other Ambulatory Visit: Payer: Self-pay | Admitting: Internal Medicine

## 2010-12-21 ENCOUNTER — Telehealth: Payer: Self-pay | Admitting: Internal Medicine

## 2010-12-21 MED ORDER — MOMETASONE FUROATE 50 MCG/ACT NA SUSP: NASAL | Status: DC

## 2010-12-21 NOTE — Telephone Encounter (Signed)
rx sent to [facility] in [place].

## 2011-02-04 ENCOUNTER — Telehealth: Payer: Self-pay | Admitting: Internal Medicine

## 2011-02-04 MED ORDER — MOMETASONE FUROATE 50 MCG/ACT NA SUSP: 2.0000 | Freq: Every day | NASAL | Status: DC

## 2011-02-04 MED ORDER — FEXOFENADINE HCL 180 MG PO TABS: 180.0000 mg | ORAL_TABLET | Freq: Every day | ORAL | Status: DC

## 2011-02-04 NOTE — Telephone Encounter (Signed)
Called and spoke with pt. Pt c/o hoarseness x 1 month.  Pt states throat isn't sore and denies difficulty swallowing.  Pt states the hoarseness "comes and goes."  Pt states she had an endo done in Feb with [facility] but states her hoarseness isn't from this.  Pt does c/o "mild sinus drainage." Denies a cough.  Pt is requesting CY's recs.  Please advise.  Thanks. Allergies  Allergen Reactions  . Aspirin Nausea And Vomiting  . Codeine Phosphate     REACTION: unspecified  . Naproxen     REACTION: unspecified  . Sulfamethoxazole     REACTION: unspecified  . Sulfur Dioxide Itching

## 2011-02-04 NOTE — Telephone Encounter (Signed)
Spoke with pt and notified of recs per CDY. Pt verbalized understanding.  

## 2011-02-04 NOTE — Telephone Encounter (Signed)
Have her try using her Nasonex, 2 sprays each nostril, twice daily Also try antihistamine like fexofenadine/ allegra 180,   Once daily

## 2011-02-21 ENCOUNTER — Other Ambulatory Visit: Payer: Self-pay | Admitting: Internal Medicine

## 2011-02-22 NOTE — Telephone Encounter (Signed)
Please advise if okay to refill Rx as not on pts med list (Advair 250/50 used in past per EMR). Also, patient is past due for appt with you-was due in 7-12. Thanks.

## 2011-02-22 NOTE — Telephone Encounter (Signed)
Ok to refill Advair HFA as requested

## 2011-04-26 LAB — CBC
HCT: 37.3
Hemoglobin: 12.7
MCHC: 34.1
Platelets: 405 — ABNORMAL HIGH
RDW: 13.9

## 2011-04-26 LAB — BASIC METABOLIC PANEL
BUN: 12
CO2: 30
Calcium: 10.1
GFR calc non Af Amer: >60
Glucose, Bld: 103 — ABNORMAL HIGH
Potassium: 3.8

## 2011-04-27 ENCOUNTER — Ambulatory Visit: Payer: Medicare Other | Attending: Family Medicine | Admitting: Physical Therapy

## 2011-04-27 DIAGNOSIS — M545 Low back pain, unspecified: Secondary | ICD-10-CM | POA: Insufficient documentation

## 2011-04-27 DIAGNOSIS — M25559 Pain in unspecified hip: Secondary | ICD-10-CM | POA: Insufficient documentation

## 2011-04-27 DIAGNOSIS — R5381 Other malaise: Secondary | ICD-10-CM | POA: Insufficient documentation

## 2011-04-27 DIAGNOSIS — IMO0001 Reserved for inherently not codable concepts without codable children: Secondary | ICD-10-CM | POA: Insufficient documentation

## 2011-04-27 LAB — CBC
HCT: 36.8
Hemoglobin: 12.5
MCHC: 33.9
MCV: 91.7
RDW: 13.9

## 2011-04-27 LAB — BASIC METABOLIC PANEL
CO2: 32
Chloride: 101
Glucose, Bld: 97
Potassium: 3.9
Sodium: 139

## 2011-04-29 ENCOUNTER — Ambulatory Visit: Payer: Medicare Other | Admitting: Physical Therapy

## 2011-05-04 ENCOUNTER — Encounter: Payer: Medicare Other | Admitting: Physical Therapy

## 2011-05-05 ENCOUNTER — Ambulatory Visit: Payer: Medicare Other | Admitting: Physical Therapy

## 2011-05-06 ENCOUNTER — Ambulatory Visit: Payer: Medicare Other | Admitting: Physical Therapy

## 2011-05-11 ENCOUNTER — Ambulatory Visit: Payer: Medicare Other | Admitting: Physical Therapy

## 2011-05-13 ENCOUNTER — Ambulatory Visit: Payer: Medicare Other | Attending: Family Medicine | Admitting: Physical Therapy

## 2011-05-13 DIAGNOSIS — IMO0001 Reserved for inherently not codable concepts without codable children: Secondary | ICD-10-CM | POA: Insufficient documentation

## 2011-05-13 DIAGNOSIS — M545 Low back pain, unspecified: Secondary | ICD-10-CM | POA: Insufficient documentation

## 2011-05-13 DIAGNOSIS — R5381 Other malaise: Secondary | ICD-10-CM | POA: Insufficient documentation

## 2011-05-13 DIAGNOSIS — M25559 Pain in unspecified hip: Secondary | ICD-10-CM | POA: Insufficient documentation

## 2011-05-17 ENCOUNTER — Encounter: Payer: Medicare Other | Admitting: Physical Therapy

## 2011-05-18 ENCOUNTER — Ambulatory Visit: Payer: Medicare Other | Admitting: Physical Therapy

## 2011-05-20 ENCOUNTER — Ambulatory Visit: Payer: Medicare Other | Admitting: Physical Therapy

## 2011-05-25 ENCOUNTER — Ambulatory Visit: Payer: Medicare Other | Admitting: Physical Therapy

## 2011-05-27 ENCOUNTER — Ambulatory Visit: Payer: Medicare Other | Admitting: Physical Therapy

## 2011-06-08 ENCOUNTER — Encounter: Payer: Medicare Other | Admitting: *Deleted

## 2011-06-09 ENCOUNTER — Ambulatory Visit: Payer: Medicare Other | Admitting: Physical Therapy

## 2011-06-10 ENCOUNTER — Other Ambulatory Visit: Payer: Self-pay | Admitting: Internal Medicine

## 2011-06-14 ENCOUNTER — Ambulatory Visit: Payer: Medicare Other | Attending: Family Medicine | Admitting: Physical Therapy

## 2011-06-14 DIAGNOSIS — M545 Low back pain, unspecified: Secondary | ICD-10-CM | POA: Insufficient documentation

## 2011-06-14 DIAGNOSIS — M25559 Pain in unspecified hip: Secondary | ICD-10-CM | POA: Insufficient documentation

## 2011-06-14 DIAGNOSIS — R5381 Other malaise: Secondary | ICD-10-CM | POA: Insufficient documentation

## 2011-06-14 DIAGNOSIS — IMO0001 Reserved for inherently not codable concepts without codable children: Secondary | ICD-10-CM | POA: Insufficient documentation

## 2011-06-17 ENCOUNTER — Encounter: Payer: Medicare Other | Admitting: Physical Therapy

## 2011-06-28 ENCOUNTER — Ambulatory Visit: Payer: Medicare Other | Admitting: Physical Therapy

## 2011-06-29 ENCOUNTER — Ambulatory Visit: Payer: Medicare Other | Admitting: Physical Therapy

## 2011-07-01 ENCOUNTER — Ambulatory Visit: Payer: Medicare Other | Admitting: Physical Therapy

## 2011-07-07 ENCOUNTER — Encounter: Payer: Medicare Other | Admitting: Physical Therapy

## 2011-07-14 DIAGNOSIS — F4323 Adjustment disorder with mixed anxiety and depressed mood: Secondary | ICD-10-CM | POA: Diagnosis not present

## 2011-07-15 DIAGNOSIS — L940 Localized scleroderma [morphea]: Secondary | ICD-10-CM | POA: Diagnosis not present

## 2011-07-22 DIAGNOSIS — F4323 Adjustment disorder with mixed anxiety and depressed mood: Secondary | ICD-10-CM | POA: Diagnosis not present

## 2011-07-30 ENCOUNTER — Other Ambulatory Visit: Payer: Self-pay | Admitting: Internal Medicine

## 2011-08-03 DIAGNOSIS — F4323 Adjustment disorder with mixed anxiety and depressed mood: Secondary | ICD-10-CM | POA: Diagnosis not present

## 2011-08-05 DIAGNOSIS — Z79899 Other long term (current) drug therapy: Secondary | ICD-10-CM | POA: Diagnosis not present

## 2011-08-05 DIAGNOSIS — E559 Vitamin D deficiency, unspecified: Secondary | ICD-10-CM | POA: Diagnosis not present

## 2011-08-05 DIAGNOSIS — K219 Gastro-esophageal reflux disease without esophagitis: Secondary | ICD-10-CM | POA: Diagnosis not present

## 2011-08-05 DIAGNOSIS — R7989 Other specified abnormal findings of blood chemistry: Secondary | ICD-10-CM | POA: Diagnosis not present

## 2011-08-05 DIAGNOSIS — I10 Essential (primary) hypertension: Secondary | ICD-10-CM | POA: Diagnosis not present

## 2011-08-05 DIAGNOSIS — E785 Hyperlipidemia, unspecified: Secondary | ICD-10-CM | POA: Diagnosis not present

## 2011-08-12 DIAGNOSIS — F4323 Adjustment disorder with mixed anxiety and depressed mood: Secondary | ICD-10-CM | POA: Diagnosis not present

## 2011-08-18 DIAGNOSIS — F4323 Adjustment disorder with mixed anxiety and depressed mood: Secondary | ICD-10-CM | POA: Diagnosis not present

## 2011-08-24 DIAGNOSIS — F4323 Adjustment disorder with mixed anxiety and depressed mood: Secondary | ICD-10-CM | POA: Diagnosis not present

## 2011-08-31 DIAGNOSIS — F4323 Adjustment disorder with mixed anxiety and depressed mood: Secondary | ICD-10-CM | POA: Diagnosis not present

## 2011-09-06 DIAGNOSIS — E785 Hyperlipidemia, unspecified: Secondary | ICD-10-CM | POA: Diagnosis not present

## 2011-09-06 DIAGNOSIS — I10 Essential (primary) hypertension: Secondary | ICD-10-CM | POA: Diagnosis not present

## 2011-09-09 DIAGNOSIS — F4323 Adjustment disorder with mixed anxiety and depressed mood: Secondary | ICD-10-CM | POA: Diagnosis not present

## 2011-09-21 DIAGNOSIS — Z9181 History of falling: Secondary | ICD-10-CM | POA: Diagnosis not present

## 2011-09-21 DIAGNOSIS — M79609 Pain in unspecified limb: Secondary | ICD-10-CM | POA: Diagnosis not present

## 2011-09-21 DIAGNOSIS — Z79899 Other long term (current) drug therapy: Secondary | ICD-10-CM | POA: Diagnosis not present

## 2011-09-21 DIAGNOSIS — R413 Other amnesia: Secondary | ICD-10-CM | POA: Diagnosis not present

## 2011-09-21 DIAGNOSIS — Z043 Encounter for examination and observation following other accident: Secondary | ICD-10-CM | POA: Diagnosis not present

## 2011-09-21 DIAGNOSIS — F29 Unspecified psychosis not due to a substance or known physiological condition: Secondary | ICD-10-CM | POA: Diagnosis not present

## 2011-09-21 DIAGNOSIS — IMO0002 Reserved for concepts with insufficient information to code with codable children: Secondary | ICD-10-CM | POA: Diagnosis not present

## 2011-09-21 DIAGNOSIS — E785 Hyperlipidemia, unspecified: Secondary | ICD-10-CM | POA: Diagnosis not present

## 2011-09-21 DIAGNOSIS — I10 Essential (primary) hypertension: Secondary | ICD-10-CM | POA: Diagnosis not present

## 2011-09-21 DIAGNOSIS — M7989 Other specified soft tissue disorders: Secondary | ICD-10-CM | POA: Diagnosis not present

## 2011-09-21 DIAGNOSIS — IMO0001 Reserved for inherently not codable concepts without codable children: Secondary | ICD-10-CM | POA: Diagnosis not present

## 2011-09-21 DIAGNOSIS — F341 Dysthymic disorder: Secondary | ICD-10-CM | POA: Diagnosis not present

## 2011-09-21 DIAGNOSIS — J449 Chronic obstructive pulmonary disease, unspecified: Secondary | ICD-10-CM | POA: Diagnosis not present

## 2011-09-21 DIAGNOSIS — I82819 Embolism and thrombosis of superficial veins of unspecified lower extremities: Secondary | ICD-10-CM | POA: Diagnosis not present

## 2011-09-21 DIAGNOSIS — I82409 Acute embolism and thrombosis of unspecified deep veins of unspecified lower extremity: Secondary | ICD-10-CM | POA: Diagnosis not present

## 2011-09-22 DIAGNOSIS — I82409 Acute embolism and thrombosis of unspecified deep veins of unspecified lower extremity: Secondary | ICD-10-CM | POA: Diagnosis not present

## 2011-09-22 DIAGNOSIS — Z9181 History of falling: Secondary | ICD-10-CM | POA: Diagnosis not present

## 2011-09-23 DIAGNOSIS — R7989 Other specified abnormal findings of blood chemistry: Secondary | ICD-10-CM | POA: Diagnosis not present

## 2011-09-30 DIAGNOSIS — R4182 Altered mental status, unspecified: Secondary | ICD-10-CM | POA: Diagnosis not present

## 2011-10-01 DIAGNOSIS — N390 Urinary tract infection, site not specified: Secondary | ICD-10-CM | POA: Diagnosis not present

## 2011-10-14 ENCOUNTER — Ambulatory Visit: Payer: Medicare Other | Attending: Family Medicine | Admitting: Physical Therapy

## 2011-10-14 DIAGNOSIS — R5381 Other malaise: Secondary | ICD-10-CM | POA: Insufficient documentation

## 2011-10-14 DIAGNOSIS — R269 Unspecified abnormalities of gait and mobility: Secondary | ICD-10-CM | POA: Insufficient documentation

## 2011-10-14 DIAGNOSIS — IMO0001 Reserved for inherently not codable concepts without codable children: Secondary | ICD-10-CM | POA: Diagnosis not present

## 2011-10-18 DIAGNOSIS — F3289 Other specified depressive episodes: Secondary | ICD-10-CM | POA: Diagnosis not present

## 2011-10-18 DIAGNOSIS — F329 Major depressive disorder, single episode, unspecified: Secondary | ICD-10-CM | POA: Diagnosis not present

## 2011-10-18 DIAGNOSIS — R6889 Other general symptoms and signs: Secondary | ICD-10-CM | POA: Diagnosis not present

## 2011-10-18 DIAGNOSIS — F079 Unspecified personality and behavioral disorder due to known physiological condition: Secondary | ICD-10-CM | POA: Diagnosis not present

## 2011-10-18 DIAGNOSIS — D518 Other vitamin B12 deficiency anemias: Secondary | ICD-10-CM | POA: Diagnosis not present

## 2011-10-19 ENCOUNTER — Ambulatory Visit: Payer: Medicare Other | Admitting: Physical Therapy

## 2011-10-19 DIAGNOSIS — N390 Urinary tract infection, site not specified: Secondary | ICD-10-CM | POA: Diagnosis not present

## 2011-10-19 DIAGNOSIS — R269 Unspecified abnormalities of gait and mobility: Secondary | ICD-10-CM | POA: Diagnosis not present

## 2011-10-19 DIAGNOSIS — IMO0001 Reserved for inherently not codable concepts without codable children: Secondary | ICD-10-CM | POA: Diagnosis not present

## 2011-10-19 DIAGNOSIS — R5381 Other malaise: Secondary | ICD-10-CM | POA: Diagnosis not present

## 2011-10-22 ENCOUNTER — Encounter: Payer: Medicare Other | Admitting: *Deleted

## 2011-10-25 DIAGNOSIS — F329 Major depressive disorder, single episode, unspecified: Secondary | ICD-10-CM | POA: Diagnosis not present

## 2011-10-25 DIAGNOSIS — F079 Unspecified personality and behavioral disorder due to known physiological condition: Secondary | ICD-10-CM | POA: Diagnosis not present

## 2011-10-26 ENCOUNTER — Ambulatory Visit: Payer: Medicare Other | Admitting: Physical Therapy

## 2011-10-26 DIAGNOSIS — IMO0001 Reserved for inherently not codable concepts without codable children: Secondary | ICD-10-CM | POA: Diagnosis not present

## 2011-10-26 DIAGNOSIS — R269 Unspecified abnormalities of gait and mobility: Secondary | ICD-10-CM | POA: Diagnosis not present

## 2011-10-26 DIAGNOSIS — R5381 Other malaise: Secondary | ICD-10-CM | POA: Diagnosis not present

## 2011-10-29 ENCOUNTER — Ambulatory Visit: Payer: Medicare Other | Admitting: Physical Therapy

## 2011-10-29 DIAGNOSIS — IMO0001 Reserved for inherently not codable concepts without codable children: Secondary | ICD-10-CM | POA: Diagnosis not present

## 2011-10-29 DIAGNOSIS — R269 Unspecified abnormalities of gait and mobility: Secondary | ICD-10-CM | POA: Diagnosis not present

## 2011-10-29 DIAGNOSIS — R5381 Other malaise: Secondary | ICD-10-CM | POA: Diagnosis not present

## 2011-11-02 ENCOUNTER — Ambulatory Visit: Payer: Medicare Other | Admitting: Physical Therapy

## 2011-11-02 DIAGNOSIS — R5381 Other malaise: Secondary | ICD-10-CM | POA: Diagnosis not present

## 2011-11-02 DIAGNOSIS — R269 Unspecified abnormalities of gait and mobility: Secondary | ICD-10-CM | POA: Diagnosis not present

## 2011-11-02 DIAGNOSIS — IMO0001 Reserved for inherently not codable concepts without codable children: Secondary | ICD-10-CM | POA: Diagnosis not present

## 2011-11-05 ENCOUNTER — Ambulatory Visit: Payer: Medicare Other | Admitting: Physical Therapy

## 2011-11-05 DIAGNOSIS — IMO0001 Reserved for inherently not codable concepts without codable children: Secondary | ICD-10-CM | POA: Diagnosis not present

## 2011-11-05 DIAGNOSIS — R5381 Other malaise: Secondary | ICD-10-CM | POA: Diagnosis not present

## 2011-11-05 DIAGNOSIS — R269 Unspecified abnormalities of gait and mobility: Secondary | ICD-10-CM | POA: Diagnosis not present

## 2011-11-09 DIAGNOSIS — F4323 Adjustment disorder with mixed anxiety and depressed mood: Secondary | ICD-10-CM | POA: Diagnosis not present

## 2011-11-10 DIAGNOSIS — R109 Unspecified abdominal pain: Secondary | ICD-10-CM | POA: Diagnosis not present

## 2011-11-10 DIAGNOSIS — F410 Panic disorder [episodic paroxysmal anxiety] without agoraphobia: Secondary | ICD-10-CM | POA: Diagnosis not present

## 2011-11-10 DIAGNOSIS — R5381 Other malaise: Secondary | ICD-10-CM | POA: Diagnosis not present

## 2011-11-10 DIAGNOSIS — I10 Essential (primary) hypertension: Secondary | ICD-10-CM | POA: Diagnosis not present

## 2011-11-10 DIAGNOSIS — R5383 Other fatigue: Secondary | ICD-10-CM | POA: Diagnosis not present

## 2011-11-10 DIAGNOSIS — Z79899 Other long term (current) drug therapy: Secondary | ICD-10-CM | POA: Diagnosis not present

## 2011-11-11 DIAGNOSIS — F4323 Adjustment disorder with mixed anxiety and depressed mood: Secondary | ICD-10-CM | POA: Diagnosis not present

## 2011-11-16 DIAGNOSIS — G8929 Other chronic pain: Secondary | ICD-10-CM | POA: Diagnosis present

## 2011-11-16 DIAGNOSIS — Z800 Family history of malignant neoplasm of digestive organs: Secondary | ICD-10-CM | POA: Diagnosis not present

## 2011-11-16 DIAGNOSIS — IMO0002 Reserved for concepts with insufficient information to code with codable children: Secondary | ICD-10-CM | POA: Diagnosis not present

## 2011-11-16 DIAGNOSIS — F411 Generalized anxiety disorder: Secondary | ICD-10-CM | POA: Diagnosis present

## 2011-11-16 DIAGNOSIS — R4182 Altered mental status, unspecified: Secondary | ICD-10-CM | POA: Diagnosis not present

## 2011-11-16 DIAGNOSIS — F3289 Other specified depressive episodes: Secondary | ICD-10-CM | POA: Diagnosis not present

## 2011-11-16 DIAGNOSIS — F332 Major depressive disorder, recurrent severe without psychotic features: Secondary | ICD-10-CM | POA: Diagnosis not present

## 2011-11-16 DIAGNOSIS — R072 Precordial pain: Secondary | ICD-10-CM | POA: Diagnosis not present

## 2011-11-16 DIAGNOSIS — Z886 Allergy status to analgesic agent status: Secondary | ICD-10-CM | POA: Diagnosis not present

## 2011-11-16 DIAGNOSIS — E876 Hypokalemia: Secondary | ICD-10-CM | POA: Diagnosis not present

## 2011-11-16 DIAGNOSIS — R45851 Suicidal ideations: Secondary | ICD-10-CM | POA: Diagnosis not present

## 2011-11-16 DIAGNOSIS — R1084 Generalized abdominal pain: Secondary | ICD-10-CM | POA: Diagnosis not present

## 2011-11-16 DIAGNOSIS — E785 Hyperlipidemia, unspecified: Secondary | ICD-10-CM | POA: Diagnosis present

## 2011-11-16 DIAGNOSIS — R630 Anorexia: Secondary | ICD-10-CM | POA: Diagnosis not present

## 2011-11-16 DIAGNOSIS — F333 Major depressive disorder, recurrent, severe with psychotic symptoms: Secondary | ICD-10-CM | POA: Diagnosis not present

## 2011-11-16 DIAGNOSIS — K439 Ventral hernia without obstruction or gangrene: Secondary | ICD-10-CM | POA: Diagnosis not present

## 2011-11-16 DIAGNOSIS — K219 Gastro-esophageal reflux disease without esophagitis: Secondary | ICD-10-CM | POA: Diagnosis not present

## 2011-11-16 DIAGNOSIS — I10 Essential (primary) hypertension: Secondary | ICD-10-CM | POA: Diagnosis not present

## 2011-11-16 DIAGNOSIS — R109 Unspecified abdominal pain: Secondary | ICD-10-CM | POA: Diagnosis not present

## 2011-11-16 DIAGNOSIS — M1990 Unspecified osteoarthritis, unspecified site: Secondary | ICD-10-CM | POA: Diagnosis present

## 2011-11-16 DIAGNOSIS — R1031 Right lower quadrant pain: Secondary | ICD-10-CM | POA: Diagnosis not present

## 2011-11-16 DIAGNOSIS — F0390 Unspecified dementia without behavioral disturbance: Secondary | ICD-10-CM | POA: Diagnosis present

## 2011-11-16 DIAGNOSIS — Z801 Family history of malignant neoplasm of trachea, bronchus and lung: Secondary | ICD-10-CM | POA: Diagnosis not present

## 2011-11-16 DIAGNOSIS — F22 Delusional disorders: Secondary | ICD-10-CM | POA: Diagnosis not present

## 2011-11-16 DIAGNOSIS — R634 Abnormal weight loss: Secondary | ICD-10-CM | POA: Diagnosis present

## 2011-11-16 DIAGNOSIS — Z79899 Other long term (current) drug therapy: Secondary | ICD-10-CM | POA: Diagnosis not present

## 2011-11-16 DIAGNOSIS — K829 Disease of gallbladder, unspecified: Secondary | ICD-10-CM | POA: Diagnosis present

## 2011-11-16 DIAGNOSIS — D131 Benign neoplasm of stomach: Secondary | ICD-10-CM | POA: Diagnosis not present

## 2011-11-16 DIAGNOSIS — R1013 Epigastric pain: Secondary | ICD-10-CM | POA: Diagnosis not present

## 2011-11-16 DIAGNOSIS — F068 Other specified mental disorders due to known physiological condition: Secondary | ICD-10-CM | POA: Diagnosis not present

## 2011-11-16 DIAGNOSIS — Z8711 Personal history of peptic ulcer disease: Secondary | ICD-10-CM | POA: Diagnosis not present

## 2011-11-16 DIAGNOSIS — F329 Major depressive disorder, single episode, unspecified: Secondary | ICD-10-CM | POA: Diagnosis not present

## 2011-11-23 DIAGNOSIS — R1031 Right lower quadrant pain: Secondary | ICD-10-CM | POA: Diagnosis not present

## 2011-11-23 DIAGNOSIS — D131 Benign neoplasm of stomach: Secondary | ICD-10-CM | POA: Diagnosis not present

## 2011-12-02 DIAGNOSIS — F4323 Adjustment disorder with mixed anxiety and depressed mood: Secondary | ICD-10-CM | POA: Diagnosis not present

## 2011-12-09 DIAGNOSIS — R7989 Other specified abnormal findings of blood chemistry: Secondary | ICD-10-CM | POA: Diagnosis not present

## 2011-12-09 DIAGNOSIS — I10 Essential (primary) hypertension: Secondary | ICD-10-CM | POA: Diagnosis not present

## 2011-12-20 DIAGNOSIS — F4323 Adjustment disorder with mixed anxiety and depressed mood: Secondary | ICD-10-CM | POA: Diagnosis not present

## 2011-12-21 DIAGNOSIS — I10 Essential (primary) hypertension: Secondary | ICD-10-CM | POA: Diagnosis not present

## 2011-12-21 DIAGNOSIS — R109 Unspecified abdominal pain: Secondary | ICD-10-CM | POA: Diagnosis not present

## 2011-12-21 DIAGNOSIS — W57XXXA Bitten or stung by nonvenomous insect and other nonvenomous arthropods, initial encounter: Secondary | ICD-10-CM | POA: Diagnosis not present

## 2011-12-21 DIAGNOSIS — T148 Other injury of unspecified body region: Secondary | ICD-10-CM | POA: Diagnosis not present

## 2011-12-27 DIAGNOSIS — F4323 Adjustment disorder with mixed anxiety and depressed mood: Secondary | ICD-10-CM | POA: Diagnosis not present

## 2012-01-07 DIAGNOSIS — H52229 Regular astigmatism, unspecified eye: Secondary | ICD-10-CM | POA: Diagnosis not present

## 2012-01-07 DIAGNOSIS — H524 Presbyopia: Secondary | ICD-10-CM | POA: Diagnosis not present

## 2012-01-07 DIAGNOSIS — H5200 Hypermetropia, unspecified eye: Secondary | ICD-10-CM | POA: Diagnosis not present

## 2012-01-07 DIAGNOSIS — Z961 Presence of intraocular lens: Secondary | ICD-10-CM | POA: Diagnosis not present

## 2012-01-10 DIAGNOSIS — M999 Biomechanical lesion, unspecified: Secondary | ICD-10-CM | POA: Diagnosis not present

## 2012-01-10 DIAGNOSIS — M9981 Other biomechanical lesions of cervical region: Secondary | ICD-10-CM | POA: Diagnosis not present

## 2012-01-10 DIAGNOSIS — M5137 Other intervertebral disc degeneration, lumbosacral region: Secondary | ICD-10-CM | POA: Diagnosis not present

## 2012-01-11 DIAGNOSIS — M9981 Other biomechanical lesions of cervical region: Secondary | ICD-10-CM | POA: Diagnosis not present

## 2012-01-11 DIAGNOSIS — M999 Biomechanical lesion, unspecified: Secondary | ICD-10-CM | POA: Diagnosis not present

## 2012-01-11 DIAGNOSIS — M5137 Other intervertebral disc degeneration, lumbosacral region: Secondary | ICD-10-CM | POA: Diagnosis not present

## 2012-01-12 DIAGNOSIS — M999 Biomechanical lesion, unspecified: Secondary | ICD-10-CM | POA: Diagnosis not present

## 2012-01-12 DIAGNOSIS — M5137 Other intervertebral disc degeneration, lumbosacral region: Secondary | ICD-10-CM | POA: Diagnosis not present

## 2012-01-12 DIAGNOSIS — M9981 Other biomechanical lesions of cervical region: Secondary | ICD-10-CM | POA: Diagnosis not present

## 2012-01-17 DIAGNOSIS — M999 Biomechanical lesion, unspecified: Secondary | ICD-10-CM | POA: Diagnosis not present

## 2012-01-17 DIAGNOSIS — M9981 Other biomechanical lesions of cervical region: Secondary | ICD-10-CM | POA: Diagnosis not present

## 2012-01-17 DIAGNOSIS — M5137 Other intervertebral disc degeneration, lumbosacral region: Secondary | ICD-10-CM | POA: Diagnosis not present

## 2012-01-19 DIAGNOSIS — M9981 Other biomechanical lesions of cervical region: Secondary | ICD-10-CM | POA: Diagnosis not present

## 2012-01-19 DIAGNOSIS — M999 Biomechanical lesion, unspecified: Secondary | ICD-10-CM | POA: Diagnosis not present

## 2012-01-19 DIAGNOSIS — M5137 Other intervertebral disc degeneration, lumbosacral region: Secondary | ICD-10-CM | POA: Diagnosis not present

## 2012-01-20 DIAGNOSIS — M5137 Other intervertebral disc degeneration, lumbosacral region: Secondary | ICD-10-CM | POA: Diagnosis not present

## 2012-01-20 DIAGNOSIS — M999 Biomechanical lesion, unspecified: Secondary | ICD-10-CM | POA: Diagnosis not present

## 2012-01-20 DIAGNOSIS — M9981 Other biomechanical lesions of cervical region: Secondary | ICD-10-CM | POA: Diagnosis not present

## 2012-01-24 DIAGNOSIS — M5137 Other intervertebral disc degeneration, lumbosacral region: Secondary | ICD-10-CM | POA: Diagnosis not present

## 2012-01-24 DIAGNOSIS — M999 Biomechanical lesion, unspecified: Secondary | ICD-10-CM | POA: Diagnosis not present

## 2012-01-24 DIAGNOSIS — M9981 Other biomechanical lesions of cervical region: Secondary | ICD-10-CM | POA: Diagnosis not present

## 2012-01-26 DIAGNOSIS — M999 Biomechanical lesion, unspecified: Secondary | ICD-10-CM | POA: Diagnosis not present

## 2012-01-26 DIAGNOSIS — M5137 Other intervertebral disc degeneration, lumbosacral region: Secondary | ICD-10-CM | POA: Diagnosis not present

## 2012-01-26 DIAGNOSIS — M9981 Other biomechanical lesions of cervical region: Secondary | ICD-10-CM | POA: Diagnosis not present

## 2012-01-27 DIAGNOSIS — M5137 Other intervertebral disc degeneration, lumbosacral region: Secondary | ICD-10-CM | POA: Diagnosis not present

## 2012-01-27 DIAGNOSIS — M9981 Other biomechanical lesions of cervical region: Secondary | ICD-10-CM | POA: Diagnosis not present

## 2012-01-27 DIAGNOSIS — M999 Biomechanical lesion, unspecified: Secondary | ICD-10-CM | POA: Diagnosis not present

## 2012-01-31 DIAGNOSIS — F4323 Adjustment disorder with mixed anxiety and depressed mood: Secondary | ICD-10-CM | POA: Diagnosis not present

## 2012-02-02 DIAGNOSIS — M9981 Other biomechanical lesions of cervical region: Secondary | ICD-10-CM | POA: Diagnosis not present

## 2012-02-02 DIAGNOSIS — M999 Biomechanical lesion, unspecified: Secondary | ICD-10-CM | POA: Diagnosis not present

## 2012-02-02 DIAGNOSIS — M5137 Other intervertebral disc degeneration, lumbosacral region: Secondary | ICD-10-CM | POA: Diagnosis not present

## 2012-02-03 DIAGNOSIS — M9981 Other biomechanical lesions of cervical region: Secondary | ICD-10-CM | POA: Diagnosis not present

## 2012-02-03 DIAGNOSIS — M5137 Other intervertebral disc degeneration, lumbosacral region: Secondary | ICD-10-CM | POA: Diagnosis not present

## 2012-02-03 DIAGNOSIS — M999 Biomechanical lesion, unspecified: Secondary | ICD-10-CM | POA: Diagnosis not present

## 2012-02-07 DIAGNOSIS — M999 Biomechanical lesion, unspecified: Secondary | ICD-10-CM | POA: Diagnosis not present

## 2012-02-07 DIAGNOSIS — M9981 Other biomechanical lesions of cervical region: Secondary | ICD-10-CM | POA: Diagnosis not present

## 2012-02-07 DIAGNOSIS — M5137 Other intervertebral disc degeneration, lumbosacral region: Secondary | ICD-10-CM | POA: Diagnosis not present

## 2012-02-09 DIAGNOSIS — M9981 Other biomechanical lesions of cervical region: Secondary | ICD-10-CM | POA: Diagnosis not present

## 2012-02-09 DIAGNOSIS — M5137 Other intervertebral disc degeneration, lumbosacral region: Secondary | ICD-10-CM | POA: Diagnosis not present

## 2012-02-09 DIAGNOSIS — M999 Biomechanical lesion, unspecified: Secondary | ICD-10-CM | POA: Diagnosis not present

## 2012-02-10 DIAGNOSIS — M9981 Other biomechanical lesions of cervical region: Secondary | ICD-10-CM | POA: Diagnosis not present

## 2012-02-10 DIAGNOSIS — M5137 Other intervertebral disc degeneration, lumbosacral region: Secondary | ICD-10-CM | POA: Diagnosis not present

## 2012-02-10 DIAGNOSIS — M999 Biomechanical lesion, unspecified: Secondary | ICD-10-CM | POA: Diagnosis not present

## 2012-02-14 DIAGNOSIS — M5137 Other intervertebral disc degeneration, lumbosacral region: Secondary | ICD-10-CM | POA: Diagnosis not present

## 2012-02-14 DIAGNOSIS — M999 Biomechanical lesion, unspecified: Secondary | ICD-10-CM | POA: Diagnosis not present

## 2012-02-14 DIAGNOSIS — M9981 Other biomechanical lesions of cervical region: Secondary | ICD-10-CM | POA: Diagnosis not present

## 2012-02-16 DIAGNOSIS — M9981 Other biomechanical lesions of cervical region: Secondary | ICD-10-CM | POA: Diagnosis not present

## 2012-02-16 DIAGNOSIS — M5137 Other intervertebral disc degeneration, lumbosacral region: Secondary | ICD-10-CM | POA: Diagnosis not present

## 2012-02-16 DIAGNOSIS — M999 Biomechanical lesion, unspecified: Secondary | ICD-10-CM | POA: Diagnosis not present

## 2012-02-17 DIAGNOSIS — F4323 Adjustment disorder with mixed anxiety and depressed mood: Secondary | ICD-10-CM | POA: Diagnosis not present

## 2012-02-21 DIAGNOSIS — M9981 Other biomechanical lesions of cervical region: Secondary | ICD-10-CM | POA: Diagnosis not present

## 2012-02-21 DIAGNOSIS — I10 Essential (primary) hypertension: Secondary | ICD-10-CM | POA: Diagnosis not present

## 2012-02-21 DIAGNOSIS — M5137 Other intervertebral disc degeneration, lumbosacral region: Secondary | ICD-10-CM | POA: Diagnosis not present

## 2012-02-21 DIAGNOSIS — M999 Biomechanical lesion, unspecified: Secondary | ICD-10-CM | POA: Diagnosis not present

## 2012-02-21 DIAGNOSIS — E785 Hyperlipidemia, unspecified: Secondary | ICD-10-CM | POA: Diagnosis not present

## 2012-02-22 DIAGNOSIS — F4323 Adjustment disorder with mixed anxiety and depressed mood: Secondary | ICD-10-CM | POA: Diagnosis not present

## 2012-02-22 DIAGNOSIS — Z1212 Encounter for screening for malignant neoplasm of rectum: Secondary | ICD-10-CM | POA: Diagnosis not present

## 2012-02-23 DIAGNOSIS — M5137 Other intervertebral disc degeneration, lumbosacral region: Secondary | ICD-10-CM | POA: Diagnosis not present

## 2012-02-23 DIAGNOSIS — M9981 Other biomechanical lesions of cervical region: Secondary | ICD-10-CM | POA: Diagnosis not present

## 2012-02-23 DIAGNOSIS — M999 Biomechanical lesion, unspecified: Secondary | ICD-10-CM | POA: Diagnosis not present

## 2012-03-07 DIAGNOSIS — F4323 Adjustment disorder with mixed anxiety and depressed mood: Secondary | ICD-10-CM | POA: Diagnosis not present

## 2012-03-27 DIAGNOSIS — F4323 Adjustment disorder with mixed anxiety and depressed mood: Secondary | ICD-10-CM | POA: Diagnosis not present

## 2012-04-15 DIAGNOSIS — Z23 Encounter for immunization: Secondary | ICD-10-CM | POA: Diagnosis not present

## 2012-04-17 DIAGNOSIS — Z1231 Encounter for screening mammogram for malignant neoplasm of breast: Secondary | ICD-10-CM | POA: Diagnosis not present

## 2012-04-18 DIAGNOSIS — F4323 Adjustment disorder with mixed anxiety and depressed mood: Secondary | ICD-10-CM | POA: Diagnosis not present

## 2012-04-27 DIAGNOSIS — F4323 Adjustment disorder with mixed anxiety and depressed mood: Secondary | ICD-10-CM | POA: Diagnosis not present

## 2012-05-09 DIAGNOSIS — F4323 Adjustment disorder with mixed anxiety and depressed mood: Secondary | ICD-10-CM | POA: Diagnosis not present

## 2012-05-25 DIAGNOSIS — E559 Vitamin D deficiency, unspecified: Secondary | ICD-10-CM | POA: Diagnosis not present

## 2012-05-25 DIAGNOSIS — K219 Gastro-esophageal reflux disease without esophagitis: Secondary | ICD-10-CM | POA: Diagnosis not present

## 2012-05-25 DIAGNOSIS — I10 Essential (primary) hypertension: Secondary | ICD-10-CM | POA: Diagnosis not present

## 2012-05-25 DIAGNOSIS — R5381 Other malaise: Secondary | ICD-10-CM | POA: Diagnosis not present

## 2012-05-25 DIAGNOSIS — R5383 Other fatigue: Secondary | ICD-10-CM | POA: Diagnosis not present

## 2012-05-25 DIAGNOSIS — E785 Hyperlipidemia, unspecified: Secondary | ICD-10-CM | POA: Diagnosis not present

## 2012-05-30 ENCOUNTER — Ambulatory Visit: Payer: Medicare Other | Attending: Family Medicine | Admitting: Physical Therapy

## 2012-05-30 DIAGNOSIS — R5381 Other malaise: Secondary | ICD-10-CM | POA: Insufficient documentation

## 2012-05-30 DIAGNOSIS — IMO0001 Reserved for inherently not codable concepts without codable children: Secondary | ICD-10-CM | POA: Insufficient documentation

## 2012-05-30 DIAGNOSIS — F4323 Adjustment disorder with mixed anxiety and depressed mood: Secondary | ICD-10-CM | POA: Diagnosis not present

## 2012-05-31 ENCOUNTER — Ambulatory Visit: Payer: Medicare Other | Admitting: Physical Therapy

## 2012-05-31 DIAGNOSIS — R5381 Other malaise: Secondary | ICD-10-CM | POA: Diagnosis not present

## 2012-05-31 DIAGNOSIS — IMO0001 Reserved for inherently not codable concepts without codable children: Secondary | ICD-10-CM | POA: Diagnosis not present

## 2012-06-05 ENCOUNTER — Ambulatory Visit: Payer: Medicare Other | Admitting: Physical Therapy

## 2012-06-05 DIAGNOSIS — IMO0001 Reserved for inherently not codable concepts without codable children: Secondary | ICD-10-CM | POA: Diagnosis not present

## 2012-06-05 DIAGNOSIS — R5381 Other malaise: Secondary | ICD-10-CM | POA: Diagnosis not present

## 2012-06-07 ENCOUNTER — Ambulatory Visit: Payer: Medicare Other | Admitting: Physical Therapy

## 2012-06-07 DIAGNOSIS — R5381 Other malaise: Secondary | ICD-10-CM | POA: Diagnosis not present

## 2012-06-07 DIAGNOSIS — IMO0001 Reserved for inherently not codable concepts without codable children: Secondary | ICD-10-CM | POA: Diagnosis not present

## 2012-06-12 ENCOUNTER — Encounter: Payer: Medicare Other | Admitting: Physical Therapy

## 2012-06-12 DIAGNOSIS — F411 Generalized anxiety disorder: Secondary | ICD-10-CM | POA: Diagnosis not present

## 2012-06-12 DIAGNOSIS — F329 Major depressive disorder, single episode, unspecified: Secondary | ICD-10-CM | POA: Diagnosis not present

## 2012-06-14 ENCOUNTER — Encounter: Payer: Medicare Other | Admitting: Physical Therapy

## 2012-06-20 DIAGNOSIS — R259 Unspecified abnormal involuntary movements: Secondary | ICD-10-CM | POA: Diagnosis not present

## 2012-06-20 DIAGNOSIS — I10 Essential (primary) hypertension: Secondary | ICD-10-CM | POA: Diagnosis not present

## 2012-06-20 DIAGNOSIS — F329 Major depressive disorder, single episode, unspecified: Secondary | ICD-10-CM | POA: Diagnosis not present

## 2012-06-22 DIAGNOSIS — F0281 Dementia in other diseases classified elsewhere with behavioral disturbance: Secondary | ICD-10-CM | POA: Diagnosis not present

## 2012-06-22 DIAGNOSIS — F329 Major depressive disorder, single episode, unspecified: Secondary | ICD-10-CM | POA: Diagnosis not present

## 2012-06-23 DIAGNOSIS — F4323 Adjustment disorder with mixed anxiety and depressed mood: Secondary | ICD-10-CM | POA: Diagnosis not present

## 2012-06-29 DIAGNOSIS — F4323 Adjustment disorder with mixed anxiety and depressed mood: Secondary | ICD-10-CM | POA: Diagnosis not present

## 2012-07-03 DIAGNOSIS — F4323 Adjustment disorder with mixed anxiety and depressed mood: Secondary | ICD-10-CM | POA: Diagnosis not present

## 2012-07-10 DIAGNOSIS — F329 Major depressive disorder, single episode, unspecified: Secondary | ICD-10-CM | POA: Diagnosis not present

## 2012-07-10 DIAGNOSIS — G309 Alzheimer's disease, unspecified: Secondary | ICD-10-CM | POA: Diagnosis not present

## 2012-07-17 DIAGNOSIS — L940 Localized scleroderma [morphea]: Secondary | ICD-10-CM | POA: Diagnosis not present

## 2012-07-27 DIAGNOSIS — F4323 Adjustment disorder with mixed anxiety and depressed mood: Secondary | ICD-10-CM | POA: Diagnosis not present

## 2012-07-31 ENCOUNTER — Ambulatory Visit: Payer: Medicare Other | Attending: Family Medicine | Admitting: Physical Therapy

## 2012-07-31 DIAGNOSIS — R5381 Other malaise: Secondary | ICD-10-CM | POA: Insufficient documentation

## 2012-07-31 DIAGNOSIS — IMO0001 Reserved for inherently not codable concepts without codable children: Secondary | ICD-10-CM | POA: Diagnosis not present

## 2012-08-02 ENCOUNTER — Ambulatory Visit: Payer: Medicare Other | Admitting: Physical Therapy

## 2012-08-03 DIAGNOSIS — F4323 Adjustment disorder with mixed anxiety and depressed mood: Secondary | ICD-10-CM | POA: Diagnosis not present

## 2012-08-07 ENCOUNTER — Ambulatory Visit: Payer: Medicare Other | Admitting: Physical Therapy

## 2012-08-09 ENCOUNTER — Encounter: Payer: Medicare Other | Admitting: Physical Therapy

## 2012-08-16 ENCOUNTER — Ambulatory Visit: Payer: Medicare Other | Attending: Family Medicine | Admitting: Physical Therapy

## 2012-08-16 DIAGNOSIS — IMO0001 Reserved for inherently not codable concepts without codable children: Secondary | ICD-10-CM | POA: Insufficient documentation

## 2012-08-16 DIAGNOSIS — M25559 Pain in unspecified hip: Secondary | ICD-10-CM | POA: Insufficient documentation

## 2012-08-16 DIAGNOSIS — R269 Unspecified abnormalities of gait and mobility: Secondary | ICD-10-CM | POA: Diagnosis not present

## 2012-08-16 DIAGNOSIS — R5381 Other malaise: Secondary | ICD-10-CM | POA: Insufficient documentation

## 2012-08-16 DIAGNOSIS — M545 Low back pain, unspecified: Secondary | ICD-10-CM | POA: Insufficient documentation

## 2012-08-22 DIAGNOSIS — F4323 Adjustment disorder with mixed anxiety and depressed mood: Secondary | ICD-10-CM | POA: Diagnosis not present

## 2012-08-23 ENCOUNTER — Encounter: Payer: Medicare Other | Admitting: Physical Therapy

## 2012-08-28 ENCOUNTER — Ambulatory Visit: Payer: Medicare Other | Admitting: Physical Therapy

## 2012-08-28 DIAGNOSIS — M25559 Pain in unspecified hip: Secondary | ICD-10-CM | POA: Diagnosis not present

## 2012-08-28 DIAGNOSIS — M545 Low back pain: Secondary | ICD-10-CM | POA: Diagnosis not present

## 2012-08-28 DIAGNOSIS — IMO0001 Reserved for inherently not codable concepts without codable children: Secondary | ICD-10-CM | POA: Diagnosis not present

## 2012-08-28 DIAGNOSIS — E785 Hyperlipidemia, unspecified: Secondary | ICD-10-CM | POA: Diagnosis not present

## 2012-08-28 DIAGNOSIS — R5381 Other malaise: Secondary | ICD-10-CM | POA: Diagnosis not present

## 2012-08-28 DIAGNOSIS — R269 Unspecified abnormalities of gait and mobility: Secondary | ICD-10-CM | POA: Diagnosis not present

## 2012-08-28 DIAGNOSIS — I10 Essential (primary) hypertension: Secondary | ICD-10-CM | POA: Diagnosis not present

## 2012-08-30 DIAGNOSIS — I10 Essential (primary) hypertension: Secondary | ICD-10-CM | POA: Diagnosis not present

## 2012-08-30 DIAGNOSIS — E559 Vitamin D deficiency, unspecified: Secondary | ICD-10-CM | POA: Diagnosis not present

## 2012-08-30 DIAGNOSIS — R5381 Other malaise: Secondary | ICD-10-CM | POA: Diagnosis not present

## 2012-08-31 ENCOUNTER — Ambulatory Visit: Payer: Medicare Other | Admitting: Physical Therapy

## 2012-09-05 ENCOUNTER — Ambulatory Visit: Payer: Medicare Other | Admitting: Physical Therapy

## 2012-09-12 ENCOUNTER — Encounter: Payer: Medicare Other | Admitting: Physical Therapy

## 2012-09-13 ENCOUNTER — Ambulatory Visit: Payer: Medicare Other | Attending: Family Medicine | Admitting: *Deleted

## 2012-09-13 DIAGNOSIS — M545 Low back pain, unspecified: Secondary | ICD-10-CM | POA: Insufficient documentation

## 2012-09-13 DIAGNOSIS — R5381 Other malaise: Secondary | ICD-10-CM | POA: Diagnosis not present

## 2012-09-13 DIAGNOSIS — M6281 Muscle weakness (generalized): Secondary | ICD-10-CM | POA: Insufficient documentation

## 2012-09-13 DIAGNOSIS — R269 Unspecified abnormalities of gait and mobility: Secondary | ICD-10-CM | POA: Diagnosis not present

## 2012-09-13 DIAGNOSIS — IMO0001 Reserved for inherently not codable concepts without codable children: Secondary | ICD-10-CM | POA: Insufficient documentation

## 2012-09-19 DIAGNOSIS — F4323 Adjustment disorder with mixed anxiety and depressed mood: Secondary | ICD-10-CM | POA: Diagnosis not present

## 2012-10-02 ENCOUNTER — Other Ambulatory Visit: Payer: Self-pay | Admitting: Family Medicine

## 2012-10-03 DIAGNOSIS — F4323 Adjustment disorder with mixed anxiety and depressed mood: Secondary | ICD-10-CM | POA: Diagnosis not present

## 2012-10-03 NOTE — Telephone Encounter (Signed)
Directions for lamictal do not match chart, pt called, no answer

## 2012-10-03 NOTE — Telephone Encounter (Signed)
Gina please discuss this with Tammy as she prepares the patient's medications weekly

## 2012-10-04 ENCOUNTER — Other Ambulatory Visit: Payer: Self-pay | Admitting: Pharmacist

## 2012-10-04 DIAGNOSIS — F329 Major depressive disorder, single episode, unspecified: Secondary | ICD-10-CM | POA: Insufficient documentation

## 2012-10-04 DIAGNOSIS — K219 Gastro-esophageal reflux disease without esophagitis: Secondary | ICD-10-CM

## 2012-10-04 MED ORDER — VORTIOXETINE HBR 10 MG PO TABS: 5.0000 mg | ORAL_TABLET | Freq: Every evening | ORAL | Status: DC

## 2012-10-04 MED ORDER — PANTOPRAZOLE SODIUM 40 MG PO TBEC: 40.0000 mg | DELAYED_RELEASE_TABLET | Freq: Two times a day (BID) | ORAL | Status: DC

## 2012-10-04 MED ORDER — DULOXETINE HCL 60 MG PO CPEP: 60.0000 mg | ORAL_CAPSULE | ORAL | Status: DC

## 2012-10-04 NOTE — Progress Notes (Signed)
Patient was seen yesterday by Saul Fordyce who changed her cymbalta 60mg  from bid to qam.  [name] also started Brintellex 10mg  1/2 tablet [=5mg ] by mouth daily in pm. Mrs. Kinnard's husband comes in today for me to adjust medications in her prefilled med boxes to match new directions and add new medicaiton.

## 2012-10-05 NOTE — Telephone Encounter (Signed)
Patient take lamical 25mg  2 tablets by mouth BID

## 2012-10-05 NOTE — Telephone Encounter (Signed)
Please look into Corrine"s lamictal

## 2012-10-19 DIAGNOSIS — F4323 Adjustment disorder with mixed anxiety and depressed mood: Secondary | ICD-10-CM | POA: Diagnosis not present

## 2012-10-20 ENCOUNTER — Other Ambulatory Visit: Payer: Self-pay | Admitting: Family Medicine

## 2012-10-23 ENCOUNTER — Encounter: Payer: Self-pay | Admitting: Pharmacist

## 2012-10-23 NOTE — Progress Notes (Signed)
Patient ID: Traci Wheeler, female   DOB: 03/06/1928, 79 y.o.   MRN: [id number]  Patient's husband is here to fill medication boxes.   We have received correspondence from Saul Fordyce, NP at [facility] with medication changes.   Medication changes are noted and explained to patient's husband as well are AVS is given with current medication regimen.  Medication boxes are filled for 2 weeks.  Henrene Pastor, PharmD, CPP

## 2012-10-24 DIAGNOSIS — F4323 Adjustment disorder with mixed anxiety and depressed mood: Secondary | ICD-10-CM | POA: Diagnosis not present

## 2012-10-25 ENCOUNTER — Ambulatory Visit (INDEPENDENT_AMBULATORY_CARE_PROVIDER_SITE_OTHER): Payer: Medicare Other | Admitting: Family Medicine

## 2012-10-25 ENCOUNTER — Encounter: Payer: Self-pay | Admitting: Family Medicine

## 2012-10-25 ENCOUNTER — Other Ambulatory Visit: Payer: Self-pay | Admitting: Family Medicine

## 2012-10-25 VITALS — BP 130/70 | HR 60 | Temp 97.2°F | Ht 64.5 in | Wt 149.4 lb

## 2012-10-25 DIAGNOSIS — W57XXXA Bitten or stung by nonvenomous insect and other nonvenomous arthropods, initial encounter: Secondary | ICD-10-CM

## 2012-10-25 DIAGNOSIS — T148 Other injury of unspecified body region: Secondary | ICD-10-CM

## 2012-10-25 MED ORDER — DOXYCYCLINE HYCLATE 100 MG PO TABS: 100.0000 mg | ORAL_TABLET | Freq: Two times a day (BID) | ORAL | Status: DC

## 2012-10-25 NOTE — Progress Notes (Signed)
  Subjective:    Patient ID: Traci Wheeler, female    DOB: 03-01-1928, 79 y.o.   MRN: [id number]  HPI History of tic bite to left medial orbit 2-3 days ago.   Review of Systems  No fever, no vision problems  Swelling and irritation of the left orbit secondary to tick bite      Objective: Doxycycline    Physical Exam  Edema of the left orbit and slight redness of the left medial orbit.  With patient consent tick removal was attempted and completed after applying acetone and cleansing with zepherin. There may have been a small remnant of the tick still present after removal was done. This remnant however, could have been just a shadow at the depth of the wound.       Assessment & Plan:  Tick bite left medial orbit  Plan:  clean area 3-4 times daily with a Q-tip and peroxide            Doxycycline 100 twice daily for 2 weeks           Recheck finger next Tuesday           Return to clinic sooner if face or swelling gets worse

## 2012-10-25 NOTE — Patient Instructions (Addendum)
Deer Tick Bite Deer ticks are brown arachnids (spider family) that vary in size from as small as the head of a pin to 1/4 inch (1/2 cm) diameter. They thrive in wooded areas. Deer are the preferred host of adult deer ticks. Small rodents are the host of young ticks (nymphs). When a person walks in a field or wooded area, young and adult ticks in the surrounding grass and vegetation can attach themselves to the skin. They can suck blood for hours to days if unnoticed. Ticks are found all over the [place] Some ticks carry a specific bacteria (Borrelia burgdorferi) that causes an infection called Lyme disease. The bacteria is typically passed into a person during the blood sucking process. This happens after the tick has been attached for at least a number of hours. While ticks can be found all over the [place] those carrying the bacteria that causes Lyme disease are most common in [place] and the [place]. Only a small proportion of ticks in these areas carry the Lyme disease bacteria and cause human infections. Ticks usually attach to warm spots on the body, such as the:  Head.  Back.  Neck.  Armpits.  Groin. SYMPTOMS  Most of the time, a deer tick bite will not be felt. You may or may not see the attached tick. You may notice mild irritation or redness around the bite site. If the deer tick passes the Lyme disease bacteria to a person, a round, red rash may be noticed 2 to 3 days after the bite. The rash may be clear in the middle, like a bull's-eye or target. If not treated, other symptoms may develop several days to weeks after the onset of the rash. These symptoms may include:  New rash lesions.  Fatigue and weakness.  General ill feeling and achiness.  Chills.  Headache and neck pain.  Swollen lymph glands.  Sore muscles and joints. 5 to 15% of untreated people with Lyme disease may develop more severe illnesses after several weeks to months. This may include inflammation of the  brain lining (meningitis), nerve palsies, an abnormal heartbeat, or severe muscle and joint pain and inflammation (myositis or arthritis). DIAGNOSIS   Physical exam and medical history.  Viewing the tick if it was saved for confirmation.  Blood tests (to check or confirm the presence of Lyme disease). TREATMENT  Most ticks do not carry disease. If found, an attached tick should be removed using tweezers. Tweezers should be placed under the body of the tick so it is removed by its attachment parts (pincers). If there are signs or symptoms of being sick, or Lyme disease is confirmed, medicines (antibiotics) that kill germs are usually prescribed. In more severe cases, antibiotics may be given through an intravenous (IV) access. HOME CARE INSTRUCTIONS   Always remove ticks with tweezers. Do not use petroleum jelly or other methods to kill or remove the tick. Slide the tweezers under the body and pull out as much as you can. If you are not sure what it is, save it in a jar and show your caregiver.  Once you remove the tick, the skin will heal on its own. Wash your hands and the affected area with water and soap. You may place a bandage on the affected area.  Take medicine as directed. You may be advised to take a full course of antibiotics.  Follow up with your caregiver as recommended. FINDING OUT THE RESULTS OF YOUR TEST Not all test results are available   be advised to take a full course of antibiotics.   Follow up with your caregiver as recommended.  FINDING OUT THE RESULTS OF YOUR TEST  Not all test results are available during your visit. If your test results are not back during the visit, make an appointment with your caregiver to find out the results. Do not assume everything is normal if you have not heard from your caregiver or the medical facility. It is important for you to follow up on all of your test results.  PROGNOSIS   If Lyme disease is confirmed, early treatment with antibiotics is very effective. Following preventive guidelines is important since it is possible to get the disease more than once.  PREVENTION    Wear long sleeves and long pants in  wooded or grassy areas. Tuck your pants into your socks.   Use an insect repellent while hiking.   Check yourself, your children, and your pets regularly for ticks after playing outside.   Clear piles of leaves or brush from your yard. Ticks might live there.  SEEK MEDICAL CARE IF:    You or your child has an oral temperature above 102 F (38.9 C).   You develop a severe headache following the bite.   You feel generally ill.   You notice a rash.   You are having trouble removing the tick.   The bite area has red skin or yellow drainage.  SEEK IMMEDIATE MEDICAL CARE IF:    Your face is weak and droopy or you have other neurological symptoms.   You have severe joint pain or weakness.  MAKE SURE YOU:    Understand these instructions.   Will watch your condition.   Will get help right away if you are not doing well or get worse.  FOR MORE INFORMATION  Centers for Disease Control and Prevention: www.cdc.gov  American Academy of Family Physicians: www.aafp.org  Document Released: 09/22/2009 Document Revised: 09/20/2011 Document Reviewed: 09/22/2009  Exit[employer] Patient Information 2013 [employer].

## 2012-10-26 ENCOUNTER — Telehealth: Payer: Self-pay | Admitting: Family Medicine

## 2012-10-26 NOTE — Telephone Encounter (Signed)
Pt informed to keep clean and use ice on eyelid 3-4 x a day

## 2012-10-31 ENCOUNTER — Ambulatory Visit (INDEPENDENT_AMBULATORY_CARE_PROVIDER_SITE_OTHER): Payer: Medicare Other | Admitting: Family Medicine

## 2012-10-31 ENCOUNTER — Telehealth: Payer: Self-pay | Admitting: *Deleted

## 2012-10-31 ENCOUNTER — Encounter: Payer: Self-pay | Admitting: Family Medicine

## 2012-10-31 VITALS — BP 135/73 | HR 72 | Temp 98.0°F | Ht 64.5 in | Wt 149.0 lb

## 2012-10-31 DIAGNOSIS — T148 Other injury of unspecified body region: Secondary | ICD-10-CM | POA: Diagnosis not present

## 2012-10-31 DIAGNOSIS — L0390 Cellulitis, unspecified: Secondary | ICD-10-CM | POA: Diagnosis not present

## 2012-10-31 DIAGNOSIS — L0291 Cutaneous abscess, unspecified: Secondary | ICD-10-CM

## 2012-10-31 DIAGNOSIS — W57XXXA Bitten or stung by nonvenomous insect and other nonvenomous arthropods, initial encounter: Secondary | ICD-10-CM

## 2012-10-31 NOTE — Patient Instructions (Signed)
Return to clinic if any problems develop from the tick bite Finish antibiotic

## 2012-10-31 NOTE — Telephone Encounter (Signed)
Called to follow-up on patient regarding tick bite.  She reports that the area is looking better and that she has a follow-up appt with Dr. Christell Constant this afternoon.

## 2012-10-31 NOTE — Progress Notes (Signed)
  Subjective:    Patient ID: Traci Wheeler, female    DOB: 1927/07/17, 79 y.o.   MRN: [id number]  HPI  Patient here to followup tick bite to left medial orbit. She has no complaints. See review of systems    Review of Systems  Constitutional: Negative for fever.  Musculoskeletal: Negative for arthralgias.  Neurological: Negative for headaches.       Objective:   Physical Exam Minimal swelling of the left orbit, no redness, no erythema, no fever . Bite site appears to be healing.       Assessment & Plan:  Healing tick bite  Finish antibiotics Call or come back sooner if any problem

## 2012-11-02 DIAGNOSIS — I10 Essential (primary) hypertension: Secondary | ICD-10-CM | POA: Diagnosis not present

## 2012-11-02 DIAGNOSIS — Z713 Dietary counseling and surveillance: Secondary | ICD-10-CM | POA: Diagnosis not present

## 2012-11-02 DIAGNOSIS — M9981 Other biomechanical lesions of cervical region: Secondary | ICD-10-CM | POA: Diagnosis not present

## 2012-11-02 DIAGNOSIS — M999 Biomechanical lesion, unspecified: Secondary | ICD-10-CM | POA: Diagnosis not present

## 2012-11-02 DIAGNOSIS — M5137 Other intervertebral disc degeneration, lumbosacral region: Secondary | ICD-10-CM | POA: Diagnosis not present

## 2012-11-06 ENCOUNTER — Ambulatory Visit (INDEPENDENT_AMBULATORY_CARE_PROVIDER_SITE_OTHER): Payer: Medicare Other | Admitting: Pharmacist

## 2012-11-06 DIAGNOSIS — F329 Major depressive disorder, single episode, unspecified: Secondary | ICD-10-CM

## 2012-11-06 MED ORDER — DIAZEPAM 5 MG PO TABS: 5.0000 mg | ORAL_TABLET | Freq: Every evening | ORAL | Status: DC | PRN

## 2012-11-06 MED ORDER — DULOXETINE HCL 60 MG PO CPEP: 60.0000 mg | ORAL_CAPSULE | Freq: Every day | ORAL | Status: DC

## 2012-11-06 NOTE — Progress Notes (Signed)
Filled pill boxes

## 2012-11-08 DIAGNOSIS — M999 Biomechanical lesion, unspecified: Secondary | ICD-10-CM | POA: Diagnosis not present

## 2012-11-08 DIAGNOSIS — M5137 Other intervertebral disc degeneration, lumbosacral region: Secondary | ICD-10-CM | POA: Diagnosis not present

## 2012-11-08 DIAGNOSIS — M9981 Other biomechanical lesions of cervical region: Secondary | ICD-10-CM | POA: Diagnosis not present

## 2012-11-08 DIAGNOSIS — I10 Essential (primary) hypertension: Secondary | ICD-10-CM | POA: Diagnosis not present

## 2012-11-09 DIAGNOSIS — M999 Biomechanical lesion, unspecified: Secondary | ICD-10-CM | POA: Diagnosis not present

## 2012-11-09 DIAGNOSIS — M5137 Other intervertebral disc degeneration, lumbosacral region: Secondary | ICD-10-CM | POA: Diagnosis not present

## 2012-11-09 DIAGNOSIS — M9981 Other biomechanical lesions of cervical region: Secondary | ICD-10-CM | POA: Diagnosis not present

## 2012-11-09 DIAGNOSIS — I10 Essential (primary) hypertension: Secondary | ICD-10-CM | POA: Diagnosis not present

## 2012-11-13 DIAGNOSIS — M9981 Other biomechanical lesions of cervical region: Secondary | ICD-10-CM | POA: Diagnosis not present

## 2012-11-13 DIAGNOSIS — M5137 Other intervertebral disc degeneration, lumbosacral region: Secondary | ICD-10-CM | POA: Diagnosis not present

## 2012-11-13 DIAGNOSIS — I10 Essential (primary) hypertension: Secondary | ICD-10-CM | POA: Diagnosis not present

## 2012-11-13 DIAGNOSIS — M999 Biomechanical lesion, unspecified: Secondary | ICD-10-CM | POA: Diagnosis not present

## 2012-11-15 DIAGNOSIS — M999 Biomechanical lesion, unspecified: Secondary | ICD-10-CM | POA: Diagnosis not present

## 2012-11-15 DIAGNOSIS — M5137 Other intervertebral disc degeneration, lumbosacral region: Secondary | ICD-10-CM | POA: Diagnosis not present

## 2012-11-15 DIAGNOSIS — M9981 Other biomechanical lesions of cervical region: Secondary | ICD-10-CM | POA: Diagnosis not present

## 2012-11-15 DIAGNOSIS — I10 Essential (primary) hypertension: Secondary | ICD-10-CM | POA: Diagnosis not present

## 2012-11-20 ENCOUNTER — Ambulatory Visit (INDEPENDENT_AMBULATORY_CARE_PROVIDER_SITE_OTHER): Payer: Medicare Other | Admitting: Pharmacist Clinician (PhC)/ Clinical Pharmacy Specialist

## 2012-11-20 DIAGNOSIS — F339 Major depressive disorder, recurrent, unspecified: Secondary | ICD-10-CM | POA: Diagnosis not present

## 2012-11-20 DIAGNOSIS — F4323 Adjustment disorder with mixed anxiety and depressed mood: Secondary | ICD-10-CM | POA: Diagnosis not present

## 2012-11-20 NOTE — Progress Notes (Signed)
Filled Patients medication box and reviewed medications for any interactions, side effects. Or other problems.  Filled boxes for 2 weeks.  Patient needs blood work next month.

## 2012-11-21 DIAGNOSIS — F4323 Adjustment disorder with mixed anxiety and depressed mood: Secondary | ICD-10-CM | POA: Diagnosis not present

## 2012-11-23 DIAGNOSIS — I10 Essential (primary) hypertension: Secondary | ICD-10-CM | POA: Diagnosis not present

## 2012-11-23 DIAGNOSIS — M9981 Other biomechanical lesions of cervical region: Secondary | ICD-10-CM | POA: Diagnosis not present

## 2012-11-23 DIAGNOSIS — M999 Biomechanical lesion, unspecified: Secondary | ICD-10-CM | POA: Diagnosis not present

## 2012-11-23 DIAGNOSIS — M5137 Other intervertebral disc degeneration, lumbosacral region: Secondary | ICD-10-CM | POA: Diagnosis not present

## 2012-11-27 ENCOUNTER — Telehealth: Payer: Self-pay | Admitting: Pharmacist

## 2012-11-27 ENCOUNTER — Other Ambulatory Visit: Payer: Self-pay | Admitting: Pharmacist

## 2012-11-27 MED ORDER — MIRTAZAPINE 15 MG PO TABS: 15.0000 mg | ORAL_TABLET | Freq: Every day | ORAL | Status: DC

## 2012-11-27 NOTE — Telephone Encounter (Signed)
Spoke to patient about medication changes that Saul Fordyce made last week. Her husband will bring pill boxes in tomorrow to make changes.

## 2012-11-27 NOTE — Progress Notes (Signed)
Filled mediation boxes for 2 weeks. Included new medication started by Saul Fordyce at [facility] Mirtazepine 15mg  1/2 tablet daily for 7 days, then 1 tablet daily thereafter.

## 2012-11-29 DIAGNOSIS — M999 Biomechanical lesion, unspecified: Secondary | ICD-10-CM | POA: Diagnosis not present

## 2012-11-29 DIAGNOSIS — M9981 Other biomechanical lesions of cervical region: Secondary | ICD-10-CM | POA: Diagnosis not present

## 2012-11-29 DIAGNOSIS — I10 Essential (primary) hypertension: Secondary | ICD-10-CM | POA: Diagnosis not present

## 2012-11-29 DIAGNOSIS — M5137 Other intervertebral disc degeneration, lumbosacral region: Secondary | ICD-10-CM | POA: Diagnosis not present

## 2012-11-30 DIAGNOSIS — M5137 Other intervertebral disc degeneration, lumbosacral region: Secondary | ICD-10-CM | POA: Diagnosis not present

## 2012-11-30 DIAGNOSIS — M999 Biomechanical lesion, unspecified: Secondary | ICD-10-CM | POA: Diagnosis not present

## 2012-11-30 DIAGNOSIS — I10 Essential (primary) hypertension: Secondary | ICD-10-CM | POA: Diagnosis not present

## 2012-11-30 DIAGNOSIS — M9981 Other biomechanical lesions of cervical region: Secondary | ICD-10-CM | POA: Diagnosis not present

## 2012-12-05 DIAGNOSIS — M9981 Other biomechanical lesions of cervical region: Secondary | ICD-10-CM | POA: Diagnosis not present

## 2012-12-05 DIAGNOSIS — M5137 Other intervertebral disc degeneration, lumbosacral region: Secondary | ICD-10-CM | POA: Diagnosis not present

## 2012-12-05 DIAGNOSIS — I10 Essential (primary) hypertension: Secondary | ICD-10-CM | POA: Diagnosis not present

## 2012-12-05 DIAGNOSIS — M999 Biomechanical lesion, unspecified: Secondary | ICD-10-CM | POA: Diagnosis not present

## 2012-12-06 DIAGNOSIS — H5000 Unspecified esotropia: Secondary | ICD-10-CM | POA: Diagnosis not present

## 2012-12-11 ENCOUNTER — Telehealth: Payer: Self-pay | Admitting: Family Medicine

## 2012-12-11 ENCOUNTER — Ambulatory Visit (INDEPENDENT_AMBULATORY_CARE_PROVIDER_SITE_OTHER): Payer: Medicare Other | Admitting: Pharmacist

## 2012-12-11 ENCOUNTER — Other Ambulatory Visit: Payer: Self-pay | Admitting: Family Medicine

## 2012-12-11 DIAGNOSIS — F0390 Unspecified dementia without behavioral disturbance: Secondary | ICD-10-CM | POA: Insufficient documentation

## 2012-12-11 DIAGNOSIS — F329 Major depressive disorder, single episode, unspecified: Secondary | ICD-10-CM

## 2012-12-11 NOTE — Telephone Encounter (Signed)
They may have been a little too early fill but should be OK to fill before next time to fill pill boxes.  Discussed with patient that should be able to pick up the other two meds next week

## 2012-12-11 NOTE — Progress Notes (Signed)
Patient's husband bring in medication boxes for filling.

## 2012-12-13 DIAGNOSIS — I10 Essential (primary) hypertension: Secondary | ICD-10-CM | POA: Diagnosis not present

## 2012-12-13 DIAGNOSIS — M9981 Other biomechanical lesions of cervical region: Secondary | ICD-10-CM | POA: Diagnosis not present

## 2012-12-13 DIAGNOSIS — M999 Biomechanical lesion, unspecified: Secondary | ICD-10-CM | POA: Diagnosis not present

## 2012-12-13 DIAGNOSIS — M5137 Other intervertebral disc degeneration, lumbosacral region: Secondary | ICD-10-CM | POA: Diagnosis not present

## 2012-12-14 ENCOUNTER — Ambulatory Visit (INDEPENDENT_AMBULATORY_CARE_PROVIDER_SITE_OTHER): Payer: Medicare Other | Admitting: Family Medicine

## 2012-12-14 ENCOUNTER — Encounter: Payer: Self-pay | Admitting: Family Medicine

## 2012-12-14 VITALS — BP 129/69 | HR 64 | Temp 97.2°F | Ht 64.5 in | Wt 153.6 lb

## 2012-12-14 DIAGNOSIS — R5381 Other malaise: Secondary | ICD-10-CM | POA: Diagnosis not present

## 2012-12-14 DIAGNOSIS — E559 Vitamin D deficiency, unspecified: Secondary | ICD-10-CM

## 2012-12-14 DIAGNOSIS — I10 Essential (primary) hypertension: Secondary | ICD-10-CM | POA: Diagnosis not present

## 2012-12-14 DIAGNOSIS — R5383 Other fatigue: Secondary | ICD-10-CM | POA: Diagnosis not present

## 2012-12-14 DIAGNOSIS — F329 Major depressive disorder, single episode, unspecified: Secondary | ICD-10-CM

## 2012-12-14 DIAGNOSIS — E785 Hyperlipidemia, unspecified: Secondary | ICD-10-CM | POA: Diagnosis not present

## 2012-12-14 LAB — POCT CBC
HCT, POC: 36.9 % — AB (ref 37.7–47.9)
Hemoglobin: 12.4 g/dL (ref 12.2–16.2)
MCH, POC: 31.1 pg (ref 27–31.2)
MPV: 6.9 fL (ref 0–99.8)
POC LYMPH PERCENT: 28.4 %L (ref 10–50)
RBC: 4 M/uL — AB (ref 4.04–5.48)

## 2012-12-14 LAB — HEPATIC FUNCTION PANEL
Bilirubin, Direct: 0.1 mg/dL (ref 0.0–0.3)
Total Bilirubin: 0.3 mg/dL (ref 0.3–1.2)

## 2012-12-14 LAB — BASIC METABOLIC PANEL WITH GFR
CO2: 31 mEq/L (ref 19–32)
Chloride: 102 mEq/L (ref 96–112)
Creat: 1.03 mg/dL (ref 0.50–1.10)

## 2012-12-14 NOTE — Patient Instructions (Addendum)
Fall precautions discussed Continue current meds and therapeutic lifestyle changes Allergic environments as much as possible--- avoid Take extra strength Tylenol for muscle spasm Discuss with a psychologist your fatigue your lack of energy and your increased sleepiness and also your lack of ambition and wanting to do a lot of things Try to get out and just get some simple exercise like walking All ways drink plenty of fluids

## 2012-12-14 NOTE — Progress Notes (Signed)
  Subjective:    Patient ID: Traci Wheeler, female    DOB: 07/24/1927, 79 y.o.   MRN: [id number]  HPI Patient comes in today for followup of chronic medical problems.   Review of Systems  Constitutional: Positive for fatigue.  HENT: Negative.   Eyes: Negative.   Respiratory: Negative.   Cardiovascular: Negative.   Gastrointestinal: Negative.   Endocrine: Negative.   Genitourinary: Negative.   Musculoskeletal: Positive for back pain (LBP) and arthralgias (knees, hips).  Allergic/Immunologic: Positive for environmental allergies (slight, seasonal).  Psychiatric/Behavioral: The patient is nervous/anxious (stable).        Objective:   Physical Exam BP 129/69  Pulse 64  Temp(Src) 97.2 F (36.2 C) (Oral)  Ht 5' 4.5" (1.638 m)  Wt 153 lb 9.6 oz (69.673 kg)  BMI 25.97 kg/m2  The patient appeared well nourished and normally developed, alert and oriented to time and place, but is somewhat anxious. Speech, behavior and judgement appear normal. Vital signs as documented.  Head exam is unremarkable. No scleral icterus or pallor noted. Ears nose and throat and mouth were all normal. Neck is without jugular venous distension, thyromegally, or carotid bruits. Carotid upstrokes are brisk bilaterally. No cervical adenopathy. Lungs are clear anteriorly and posteriorly to auscultation. Normal respiratory effort. No wheezing was noted Cardiac exam reveals regular rate and rhythm. First and second heart sounds normal.  No murmurs, rubs or gallops.  Abdominal exam reveals normal bowl sounds, no masses, no organomegaly and no aortic enlargement. No inguinal adenopathy. Extremities are nonedematous and both femoral and pedal pulses are normal. Skin without pallor or jaundice.  Warm and dry, without rash. Neurologic exam reveals normal deep tendon reflexes and normal sensation.          Assessment & Plan:    1. Hypertension - BASIC METABOLIC PANEL WITH GFR; Standing - BASIC METABOLIC  PANEL WITH GFR  2. Hyperlipemia - NMR Lipoprofile with Lipids; Standing - Hepatic function panel; Standing - NMR Lipoprofile with Lipids - Hepatic function panel  3. Depression  4. Fatigue - POCT CBC; Standing - POCT CBC  5. Vitamin D deficiency - Vitamin D 25 hydroxy; Standing - Vitamin D 25 hydroxy  Patient Instructions  Fall precautions discussed Continue current meds and therapeutic lifestyle changes Allergic environments as much as possible--- avoid Take extra strength Tylenol for muscle spasm Discuss with a psychologist your fatigue your lack of energy and your increased sleepiness and also your lack of ambition and wanting to do a lot of things Try to get out and just get some simple exercise like walking All ways drink plenty of fluids

## 2012-12-15 LAB — NMR LIPOPROFILE WITH LIPIDS
Cholesterol, Total: 146 mg/dL (ref <200)
LDL (calc): 59 mg/dL (ref <100)
LDL Particle Number: 947 nmol/L (ref <1000)
LP-IR Score: 42 (ref <=45)
Large VLDL-P: 2.5 nmol/L (ref <=2.7)
Triglycerides: 156 mg/dL — ABNORMAL HIGH (ref <150)
VLDL Size: 46.4 nm (ref <=46.6)

## 2012-12-19 DIAGNOSIS — F4323 Adjustment disorder with mixed anxiety and depressed mood: Secondary | ICD-10-CM | POA: Diagnosis not present

## 2012-12-20 ENCOUNTER — Ambulatory Visit: Payer: Self-pay

## 2012-12-20 DIAGNOSIS — M5137 Other intervertebral disc degeneration, lumbosacral region: Secondary | ICD-10-CM | POA: Diagnosis not present

## 2012-12-20 DIAGNOSIS — M999 Biomechanical lesion, unspecified: Secondary | ICD-10-CM | POA: Diagnosis not present

## 2012-12-20 DIAGNOSIS — H5051 Esophoria: Secondary | ICD-10-CM | POA: Diagnosis not present

## 2012-12-20 DIAGNOSIS — I10 Essential (primary) hypertension: Secondary | ICD-10-CM | POA: Diagnosis not present

## 2012-12-20 DIAGNOSIS — M9981 Other biomechanical lesions of cervical region: Secondary | ICD-10-CM | POA: Diagnosis not present

## 2012-12-22 ENCOUNTER — Encounter: Payer: Self-pay | Admitting: Family Medicine

## 2012-12-22 ENCOUNTER — Telehealth: Payer: Self-pay | Admitting: Pharmacist

## 2012-12-22 NOTE — Telephone Encounter (Signed)
Patient called because her husband did not show for appt Wednesday to fill pill boxes.  Pharmacist will not be back in office until Tuesday 12/26/2012 and she will not have pill box filled for Monday night 12/25/2012.   Spoke with patient on phone and she wrote down all medications and repeated back to me that need to be included in pill box for Monday night only.  Her husband will bring in boxes Tuesday 12/26/12 to be refilled.

## 2012-12-25 DIAGNOSIS — F4323 Adjustment disorder with mixed anxiety and depressed mood: Secondary | ICD-10-CM | POA: Diagnosis not present

## 2012-12-26 ENCOUNTER — Ambulatory Visit (INDEPENDENT_AMBULATORY_CARE_PROVIDER_SITE_OTHER): Payer: Medicare Other | Admitting: Pharmacist

## 2012-12-26 DIAGNOSIS — F3289 Other specified depressive episodes: Secondary | ICD-10-CM

## 2012-12-26 DIAGNOSIS — F329 Major depressive disorder, single episode, unspecified: Secondary | ICD-10-CM

## 2012-12-26 MED ORDER — DONEPEZIL HCL 10 MG PO TABS: 10.0000 mg | ORAL_TABLET | Freq: Every day | ORAL | Status: DC

## 2012-12-26 MED ORDER — PANTOPRAZOLE SODIUM 40 MG PO TBEC: 40.0000 mg | DELAYED_RELEASE_TABLET | Freq: Two times a day (BID) | ORAL | Status: DC

## 2012-12-26 MED ORDER — BUPROPION HCL ER (XL) 150 MG PO TB24: 150.0000 mg | ORAL_TABLET | ORAL | Status: DC

## 2012-12-26 MED ORDER — DIAZEPAM 5 MG PO TABS: ORAL_TABLET | ORAL | Status: DC

## 2012-12-26 NOTE — Progress Notes (Signed)
Filled pill boxes that husband brought in

## 2012-12-27 ENCOUNTER — Other Ambulatory Visit (INDEPENDENT_AMBULATORY_CARE_PROVIDER_SITE_OTHER): Payer: Medicare Other

## 2012-12-27 DIAGNOSIS — R7989 Other specified abnormal findings of blood chemistry: Secondary | ICD-10-CM

## 2012-12-27 DIAGNOSIS — R7309 Other abnormal glucose: Secondary | ICD-10-CM | POA: Diagnosis not present

## 2012-12-27 DIAGNOSIS — M5137 Other intervertebral disc degeneration, lumbosacral region: Secondary | ICD-10-CM | POA: Diagnosis not present

## 2012-12-27 DIAGNOSIS — I10 Essential (primary) hypertension: Secondary | ICD-10-CM | POA: Diagnosis not present

## 2012-12-27 DIAGNOSIS — M999 Biomechanical lesion, unspecified: Secondary | ICD-10-CM | POA: Diagnosis not present

## 2012-12-27 DIAGNOSIS — M9981 Other biomechanical lesions of cervical region: Secondary | ICD-10-CM | POA: Diagnosis not present

## 2012-12-27 LAB — GLUCOSE, POCT (MANUAL RESULT ENTRY): POC Glucose: 72 mg/dl (ref 70–99)

## 2012-12-27 NOTE — Progress Notes (Signed)
Patient came in for re-check on blood sugar

## 2013-01-01 ENCOUNTER — Ambulatory Visit: Payer: Self-pay

## 2013-01-03 DIAGNOSIS — I10 Essential (primary) hypertension: Secondary | ICD-10-CM | POA: Diagnosis not present

## 2013-01-03 DIAGNOSIS — M5137 Other intervertebral disc degeneration, lumbosacral region: Secondary | ICD-10-CM | POA: Diagnosis not present

## 2013-01-03 DIAGNOSIS — M999 Biomechanical lesion, unspecified: Secondary | ICD-10-CM | POA: Diagnosis not present

## 2013-01-03 DIAGNOSIS — M9981 Other biomechanical lesions of cervical region: Secondary | ICD-10-CM | POA: Diagnosis not present

## 2013-01-04 ENCOUNTER — Other Ambulatory Visit: Payer: Self-pay | Admitting: Family Medicine

## 2013-01-09 DIAGNOSIS — H26499 Other secondary cataract, unspecified eye: Secondary | ICD-10-CM | POA: Diagnosis not present

## 2013-01-09 DIAGNOSIS — H52229 Regular astigmatism, unspecified eye: Secondary | ICD-10-CM | POA: Diagnosis not present

## 2013-01-09 DIAGNOSIS — H5200 Hypermetropia, unspecified eye: Secondary | ICD-10-CM | POA: Diagnosis not present

## 2013-01-09 DIAGNOSIS — Z961 Presence of intraocular lens: Secondary | ICD-10-CM | POA: Diagnosis not present

## 2013-01-10 ENCOUNTER — Other Ambulatory Visit: Payer: Self-pay | Admitting: Pharmacist

## 2013-01-10 DIAGNOSIS — F329 Major depressive disorder, single episode, unspecified: Secondary | ICD-10-CM

## 2013-01-10 MED ORDER — LAMOTRIGINE 25 MG PO TABS: 25.0000 mg | ORAL_TABLET | Freq: Two times a day (BID) | ORAL | Status: DC

## 2013-01-16 ENCOUNTER — Ambulatory Visit: Payer: Self-pay

## 2013-01-16 DIAGNOSIS — F4323 Adjustment disorder with mixed anxiety and depressed mood: Secondary | ICD-10-CM | POA: Diagnosis not present

## 2013-01-23 DIAGNOSIS — F4323 Adjustment disorder with mixed anxiety and depressed mood: Secondary | ICD-10-CM | POA: Diagnosis not present

## 2013-02-05 ENCOUNTER — Ambulatory Visit (INDEPENDENT_AMBULATORY_CARE_PROVIDER_SITE_OTHER): Payer: Medicare Other | Admitting: Pharmacist

## 2013-02-05 DIAGNOSIS — F329 Major depressive disorder, single episode, unspecified: Secondary | ICD-10-CM

## 2013-02-05 MED ORDER — DULOXETINE HCL 60 MG PO CPEP: 60.0000 mg | ORAL_CAPSULE | Freq: Every day | ORAL | Status: DC

## 2013-02-05 NOTE — Progress Notes (Signed)
Filled pill boxes

## 2013-02-08 ENCOUNTER — Ambulatory Visit (INDEPENDENT_AMBULATORY_CARE_PROVIDER_SITE_OTHER): Payer: Medicare Other | Admitting: Pharmacist

## 2013-02-08 DIAGNOSIS — F329 Major depressive disorder, single episode, unspecified: Secondary | ICD-10-CM

## 2013-02-08 NOTE — Progress Notes (Signed)
Discussed patient with Saul Fordyce, NP at [facility].   [name] saw Traci Wheeler about 1 week ago and suggested changing some of her medications.  Kelly discontinued Brintellix / Vortiosetine 10mg  and increased lamotrigine from 25mg  bid to 50mg  bid.   Traci Wheeler's husband RTC today  So that I may adjust medications in patient's pill box.   I removed Brintellix from pill box and changed lamotrigine to 50mg  bid.

## 2013-02-13 DIAGNOSIS — F4323 Adjustment disorder with mixed anxiety and depressed mood: Secondary | ICD-10-CM | POA: Diagnosis not present

## 2013-02-19 ENCOUNTER — Ambulatory Visit (INDEPENDENT_AMBULATORY_CARE_PROVIDER_SITE_OTHER): Payer: Medicare Other | Admitting: Pharmacist

## 2013-02-19 DIAGNOSIS — F32A Depression, unspecified: Secondary | ICD-10-CM

## 2013-02-19 DIAGNOSIS — F329 Major depressive disorder, single episode, unspecified: Secondary | ICD-10-CM

## 2013-02-19 NOTE — Progress Notes (Signed)
Filled patient's pill boxes for 2 weeks.  Also called in medication refills for patient that will be needed for next appt in 2 weeks.

## 2013-02-27 DIAGNOSIS — F4323 Adjustment disorder with mixed anxiety and depressed mood: Secondary | ICD-10-CM | POA: Diagnosis not present

## 2013-03-05 ENCOUNTER — Ambulatory Visit (INDEPENDENT_AMBULATORY_CARE_PROVIDER_SITE_OTHER): Payer: Medicare Other | Admitting: Pharmacist

## 2013-03-05 DIAGNOSIS — F329 Major depressive disorder, single episode, unspecified: Secondary | ICD-10-CM

## 2013-03-05 NOTE — Progress Notes (Signed)
Received phone call from Saul Fordyce, NP at [facility] that Wellburtin XL / buproprion had been increased from 150mg  daily to 300mg  daily.   Patient has rx from [place] but has not filled yet - was able to use 150mg  2 tablets daily in pill boxes for next 2 weeks.   Filled patient's pill boxes for 2 weeks.  Next appt September.

## 2013-03-19 ENCOUNTER — Ambulatory Visit (INDEPENDENT_AMBULATORY_CARE_PROVIDER_SITE_OTHER): Payer: Self-pay | Admitting: Pharmacist

## 2013-03-19 DIAGNOSIS — F329 Major depressive disorder, single episode, unspecified: Secondary | ICD-10-CM

## 2013-03-19 DIAGNOSIS — L570 Actinic keratosis: Secondary | ICD-10-CM | POA: Diagnosis not present

## 2013-03-19 DIAGNOSIS — D235 Other benign neoplasm of skin of trunk: Secondary | ICD-10-CM | POA: Diagnosis not present

## 2013-03-19 MED ORDER — DONEPEZIL HCL 10 MG PO TABS: 10.0000 mg | ORAL_TABLET | Freq: Every day | ORAL | Status: DC

## 2013-03-19 NOTE — Progress Notes (Signed)
Filled pill boxes 

## 2013-03-22 DIAGNOSIS — F4323 Adjustment disorder with mixed anxiety and depressed mood: Secondary | ICD-10-CM | POA: Diagnosis not present

## 2013-04-02 ENCOUNTER — Ambulatory Visit (INDEPENDENT_AMBULATORY_CARE_PROVIDER_SITE_OTHER): Payer: Self-pay | Admitting: Pharmacist

## 2013-04-02 DIAGNOSIS — F329 Major depressive disorder, single episode, unspecified: Secondary | ICD-10-CM

## 2013-04-02 DIAGNOSIS — F3289 Other specified depressive episodes: Secondary | ICD-10-CM

## 2013-04-02 NOTE — Progress Notes (Signed)
Filled pill boxes for 2 weeks.  No charge for visit

## 2013-04-03 DIAGNOSIS — F4323 Adjustment disorder with mixed anxiety and depressed mood: Secondary | ICD-10-CM | POA: Diagnosis not present

## 2013-04-10 DIAGNOSIS — Z1231 Encounter for screening mammogram for malignant neoplasm of breast: Secondary | ICD-10-CM | POA: Diagnosis not present

## 2013-04-16 ENCOUNTER — Ambulatory Visit (INDEPENDENT_AMBULATORY_CARE_PROVIDER_SITE_OTHER): Payer: Medicare Other | Admitting: Pharmacist

## 2013-04-16 DIAGNOSIS — Z23 Encounter for immunization: Secondary | ICD-10-CM

## 2013-04-16 DIAGNOSIS — F329 Major depressive disorder, single episode, unspecified: Secondary | ICD-10-CM

## 2013-04-16 MED ORDER — DIAZEPAM 5 MG PO TABS: ORAL_TABLET | ORAL | Status: DC

## 2013-04-16 NOTE — Progress Notes (Addendum)
Patient's husband brought in mediation pill boxes for 2 weeks.  Filled boxes for patient.  Rx's that needed refills prior to next appt in [facility]    Patient's husband states that she is sleeping a lot at night and during the day.  He is concerned about the diazepam.  Will decrease to 1/2 tablet at night for 7 days and then discontinue.

## 2013-04-24 DIAGNOSIS — F4323 Adjustment disorder with mixed anxiety and depressed mood: Secondary | ICD-10-CM | POA: Diagnosis not present

## 2013-04-30 ENCOUNTER — Ambulatory Visit (INDEPENDENT_AMBULATORY_CARE_PROVIDER_SITE_OTHER): Payer: Self-pay | Admitting: Pharmacist

## 2013-04-30 DIAGNOSIS — H5051 Esophoria: Secondary | ICD-10-CM | POA: Diagnosis not present

## 2013-04-30 DIAGNOSIS — F329 Major depressive disorder, single episode, unspecified: Secondary | ICD-10-CM

## 2013-04-30 DIAGNOSIS — H532 Diplopia: Secondary | ICD-10-CM | POA: Diagnosis not present

## 2013-04-30 MED ORDER — DULOXETINE HCL 60 MG PO CPEP: 60.0000 mg | ORAL_CAPSULE | Freq: Every day | ORAL | Status: DC

## 2013-04-30 NOTE — Progress Notes (Signed)
Filled pill boxes for 2 weeks.

## 2013-05-07 ENCOUNTER — Ambulatory Visit: Payer: Medicare Other | Admitting: Family Medicine

## 2013-05-14 ENCOUNTER — Encounter (INDEPENDENT_AMBULATORY_CARE_PROVIDER_SITE_OTHER): Payer: Self-pay

## 2013-05-14 ENCOUNTER — Ambulatory Visit (INDEPENDENT_AMBULATORY_CARE_PROVIDER_SITE_OTHER): Payer: Medicare Other | Admitting: Family Medicine

## 2013-05-14 ENCOUNTER — Ambulatory Visit (INDEPENDENT_AMBULATORY_CARE_PROVIDER_SITE_OTHER): Payer: Medicare Other

## 2013-05-14 ENCOUNTER — Ambulatory Visit: Payer: Self-pay

## 2013-05-14 ENCOUNTER — Encounter: Payer: Self-pay | Admitting: Family Medicine

## 2013-05-14 VITALS — BP 148/71 | HR 52 | Temp 97.0°F | Ht 64.5 in | Wt 169.0 lb

## 2013-05-14 DIAGNOSIS — I10 Essential (primary) hypertension: Secondary | ICD-10-CM

## 2013-05-14 DIAGNOSIS — E785 Hyperlipidemia, unspecified: Secondary | ICD-10-CM | POA: Diagnosis not present

## 2013-05-14 DIAGNOSIS — E559 Vitamin D deficiency, unspecified: Secondary | ICD-10-CM

## 2013-05-14 DIAGNOSIS — F32A Depression, unspecified: Secondary | ICD-10-CM

## 2013-05-14 DIAGNOSIS — F329 Major depressive disorder, single episode, unspecified: Secondary | ICD-10-CM

## 2013-05-14 DIAGNOSIS — R413 Other amnesia: Secondary | ICD-10-CM

## 2013-05-14 DIAGNOSIS — M546 Pain in thoracic spine: Secondary | ICD-10-CM

## 2013-05-14 DIAGNOSIS — M549 Dorsalgia, unspecified: Secondary | ICD-10-CM | POA: Diagnosis not present

## 2013-05-14 LAB — POCT CBC
HCT, POC: 38.7 % (ref 37.7–47.9)
Hemoglobin: 12.6 g/dL (ref 12.2–16.2)
MCHC: 32.6 g/dL (ref 31.8–35.4)
MCV: 91.3 fL (ref 80–97)
POC Granulocyte: 3.3 (ref 2–6.9)
RDW, POC: 14.2 %
WBC: 5.2 10*3/uL (ref 4.6–10.2)

## 2013-05-14 NOTE — Patient Instructions (Addendum)
Continue current medications. Continue good therapeutic lifestyle changes.  Fall precautions discussed with patient. Follow up as planned and earlier as needed.  Try to keep your mind stimulated as much as possible Try heat a healthy diet Drink plenty of fluids Once we get the x-ray reports back we will consider scheduling you for a visit with the orthopedic Continued visits with the [facility] vitamin D3 1000 daily

## 2013-05-14 NOTE — Progress Notes (Signed)
Subjective:    Patient ID: Traci Wheeler, female    DOB: 20-Jun-1928, 79 y.o.   MRN: [id number]  HPI Pt here for follow up and management  of chronic medical problems. Patient comes in today with her husband. She is still getting counseling from the [facility] in [place]. She indicates that her memory continues to deteriorate. She also complains of low back pain thoracic spine pain and bilateral hip pain. She is worrying continuously about not being able to stay at task with doing things around her home.     Patient Active Problem List   Diagnosis Date Noted  . Hypertension 12/14/2012  . Hyperlipemia 12/14/2012  . Depression 10/04/2012  . ALLERGIC RHINITIS 07/19/2007  . ASTHMA 07/19/2007  . ESOPHAGEAL REFLUX 07/19/2007   Outpatient Encounter Prescriptions as of 05/14/2013  Medication Sig  . amLODipine (NORVASC) 10 MG tablet Take 10 mg by mouth daily.    Marland Kitchen buPROPion (WELLBUTRIN XL) 300 MG 24 hr tablet Take 300 mg by mouth every morning.  . diazepam (VALIUM) 5 MG tablet Take 1 tablet at bedtime.  May take extra 1/2 tablet during day as needed.  . donepezil (ARICEPT) 10 MG tablet Take 1 tablet (10 mg total) by mouth at bedtime.  . DULoxetine (CYMBALTA) 60 MG capsule Take 1 capsule (60 mg total) by mouth daily.  Marland Kitchen ezetimibe-simvastatin (VYTORIN) 10-40 MG per tablet 1/2 tablet daily in pm  . Fluticasone-Salmeterol (ADVAIR) 250-50 MCG/DOSE AEPB Inhale 1 puff into the lungs every 12 (twelve) hours.  Marland Kitchen lamoTRIgine (LAMICTAL) 25 MG tablet Take 2 tablets (50 mg total) by mouth 2 (two) times daily.  Marland Kitchen lurasidone (LATUDA) 40 MG TABS Take 20 mg by mouth at bedtime.   . Memantine HCl ER (NAMENDA XR) 28 MG CP24 Take 28 mg by mouth every morning.  . mirtazapine (REMERON) 15 MG tablet Take 1 tablet (15 mg total) by mouth at bedtime. 1/2 tablet daily for 7 days, then 1 tablet daily thereafter.  . mometasone (NASONEX) 50 MCG/ACT nasal spray Place 2 sprays into the nose as needed.   . pantoprazole (PROTONIX) 40 MG tablet Take 1 tablet (40 mg total) by mouth 2 (two) times daily.  Marland Kitchen albuterol (PROVENTIL HFA;VENTOLIN HFA) 108 (90 BASE) MCG/ACT inhaler Inhale 2 puffs into the lungs every 6 (six) hours as needed for wheezing.    Review of Systems  Constitutional: Negative.   HENT: Negative.   Eyes: Negative.   Respiratory: Negative.   Cardiovascular: Negative.   Gastrointestinal: Negative.   Endocrine: Negative.   Genitourinary: Negative.   Musculoskeletal: Negative.   Skin: Negative.   Allergic/Immunologic: Negative.   Neurological: Negative.   Hematological: Negative.   Psychiatric/Behavioral: Positive for confusion (memory is worse than last visit).       Objective:   Physical Exam  Nursing note and vitals reviewed. Constitutional: She is oriented to person, place, and time. She appears well-developed and well-nourished. She appears distressed.  HENT:  Head: Normocephalic and atraumatic.  Nose: Nose normal.  Mouth/Throat: Oropharynx is clear and moist.  Eyes: Conjunctivae and EOM are normal. Pupils are equal, round, and reactive to light. Right eye exhibits no discharge. Left eye exhibits no discharge. No scleral icterus.  Neck: Normal range of motion. Neck supple. No JVD present. No thyromegaly present.  Cardiovascular: Normal rate, regular rhythm, normal heart sounds and intact distal pulses.  Exam reveals no gallop and no friction rub.   No murmur heard. At 72 per min  Pulmonary/Chest: Effort normal and  breath sounds normal. No respiratory distress. She has no wheezes. She has no rales. She exhibits no tenderness.  Abdominal: Soft. Bowel sounds are normal. She exhibits no mass. There is no tenderness. There is no rebound and no guarding.  Large umbilical hernia  Musculoskeletal: Normal range of motion. She exhibits no edema and no tenderness.  Lymphadenopathy:    She has no cervical adenopathy.  Neurological: She is alert and oriented to person,  place, and time. No cranial nerve deficit.  DTRs are slightly diminished on the right compared to the left in her lower extremity  Skin: Skin is warm and dry. No rash noted.  Psychiatric: Her behavior is normal. Judgment and thought content normal.  She is obviously very down and depressed   BP 148/71  Pulse 52  Temp(Src) 97 F (36.1 C) (Oral)  Ht 5' 4.5" (1.638 m)  Wt 169 lb (76.658 kg)  BMI 28.57 kg/m2  SpO2 95%  [facility] reading (PRIMARY) by  Dr.Chesky Heyer: L. spine and thoracic spine----   degenerative changes                                     Assessment & Plan:   1. Depression   2. Hyperlipemia   3. Hypertension   4. Vitamin D deficiency   5. Back pain   6. Memory deficit    Orders Placed This Encounter  Procedures  . DG Lumbar Spine 2-3 Views    Standing Status: Future     Number of Occurrences: 1     Standing Expiration Date: 07/14/2014    Order Specific Question:  Reason for Exam (SYMPTOM  OR DIAGNOSIS REQUIRED)    Answer:  back pain    Order Specific Question:  Preferred imaging location?    Answer:  Internal  . DG Thoracic Spine 2 View    Standing Status: Future     Number of Occurrences: 1     Standing Expiration Date: 07/14/2014    Order Specific Question:  Reason for Exam (SYMPTOM  OR DIAGNOSIS REQUIRED)    Answer:  back pain    Order Specific Question:  Preferred imaging location?    Answer:  Internal  . Hepatic function panel  . BMP8+EGFR  . Vit D  25 hydroxy (rtn osteoporosis monitoring)  . Lipid panel  . POCT CBC   No orders of the defined types were placed in this encounter.   Patient Instructions  Continue current medications. Continue good therapeutic lifestyle changes.  Fall precautions discussed with patient. Follow up as planned and earlier as needed.  Try to keep your mind stimulated as much as possible Try heat a healthy diet Drink plenty of fluids Once we get the x-ray reports back we will consider scheduling you for a visit with the  orthopedic Continued visits with the [facility] Start vitamin D3 1000 daily   Nyra Capes MD

## 2013-05-15 ENCOUNTER — Other Ambulatory Visit: Payer: Self-pay | Admitting: *Deleted

## 2013-05-15 DIAGNOSIS — M549 Dorsalgia, unspecified: Secondary | ICD-10-CM

## 2013-05-15 LAB — VITAMIN D 25 HYDROXY (VIT D DEFICIENCY, FRACTURES): Vit D, 25-Hydroxy: 52.5 ng/mL (ref 30.0–100.0)

## 2013-05-15 LAB — HEPATIC FUNCTION PANEL
AST: 18 IU/L (ref 0–40)
Alkaline Phosphatase: 99 IU/L (ref 39–117)
Bilirubin, Direct: 0.09 mg/dL (ref 0.00–0.40)
Total Protein: 6.1 g/dL (ref 6.0–8.5)

## 2013-05-15 LAB — BMP8+EGFR
BUN: 15 mg/dL (ref 8–27)
Calcium: 9.8 mg/dL (ref 8.6–10.2)
Creatinine, Ser: 1.14 mg/dL — ABNORMAL HIGH (ref 0.57–1.00)
GFR calc non Af Amer: 45 mL/min/{1.73_m2} — ABNORMAL LOW (ref >59)
Glucose: 145 mg/dL — ABNORMAL HIGH (ref 65–99)
Potassium: 4.1 mmol/L (ref 3.5–5.2)
Sodium: 145 mmol/L — ABNORMAL HIGH (ref 134–144)

## 2013-05-15 LAB — LIPID PANEL
Cholesterol, Total: 132 mg/dL (ref 100–199)
HDL: 64 mg/dL (ref >39)
Triglycerides: 81 mg/dL (ref 0–149)

## 2013-05-28 ENCOUNTER — Ambulatory Visit (INDEPENDENT_AMBULATORY_CARE_PROVIDER_SITE_OTHER): Payer: Medicare Other | Admitting: Pharmacist

## 2013-05-28 DIAGNOSIS — F329 Major depressive disorder, single episode, unspecified: Secondary | ICD-10-CM

## 2013-05-28 MED ORDER — LAMOTRIGINE 25 MG PO TABS: 50.0000 mg | ORAL_TABLET | Freq: Two times a day (BID) | ORAL | Status: DC

## 2013-05-28 NOTE — Progress Notes (Signed)
Filled patient's pill box. 

## 2013-05-29 DIAGNOSIS — F4323 Adjustment disorder with mixed anxiety and depressed mood: Secondary | ICD-10-CM | POA: Diagnosis not present

## 2013-06-11 ENCOUNTER — Ambulatory Visit (INDEPENDENT_AMBULATORY_CARE_PROVIDER_SITE_OTHER): Payer: Medicare Other | Admitting: Pharmacist

## 2013-06-11 DIAGNOSIS — L821 Other seborrheic keratosis: Secondary | ICD-10-CM | POA: Diagnosis not present

## 2013-06-11 DIAGNOSIS — L820 Inflamed seborrheic keratosis: Secondary | ICD-10-CM | POA: Diagnosis not present

## 2013-06-11 DIAGNOSIS — F329 Major depressive disorder, single episode, unspecified: Secondary | ICD-10-CM

## 2013-06-11 DIAGNOSIS — L570 Actinic keratosis: Secondary | ICD-10-CM | POA: Diagnosis not present

## 2013-06-11 MED ORDER — PANTOPRAZOLE SODIUM 40 MG PO TBEC: 40.0000 mg | DELAYED_RELEASE_TABLET | Freq: Two times a day (BID) | ORAL | Status: DC

## 2013-06-11 MED ORDER — MEMANTINE HCL ER 28 MG PO CP24: 28.0000 mg | ORAL_CAPSULE | ORAL | Status: DC

## 2013-06-11 NOTE — Progress Notes (Signed)
Patient's husband brings in medication boxes for filling.  Filled boxes for 2 weeks.

## 2013-06-15 ENCOUNTER — Encounter: Payer: Self-pay | Admitting: *Deleted

## 2013-06-21 DIAGNOSIS — F4323 Adjustment disorder with mixed anxiety and depressed mood: Secondary | ICD-10-CM | POA: Diagnosis not present

## 2013-06-25 ENCOUNTER — Ambulatory Visit (INDEPENDENT_AMBULATORY_CARE_PROVIDER_SITE_OTHER): Payer: Medicare Other | Admitting: Pharmacist

## 2013-06-25 DIAGNOSIS — F329 Major depressive disorder, single episode, unspecified: Secondary | ICD-10-CM

## 2013-06-25 NOTE — Progress Notes (Signed)
Filled patient's pill boxes 

## 2013-06-27 NOTE — Addendum Note (Signed)
Addended by: Henrene Pastor on: 06/27/2013 10:14 AM   Modules accepted: Orders, Medications

## 2013-07-09 ENCOUNTER — Ambulatory Visit: Payer: Self-pay

## 2013-07-09 DIAGNOSIS — I70209 Unspecified atherosclerosis of native arteries of extremities, unspecified extremity: Secondary | ICD-10-CM | POA: Diagnosis not present

## 2013-07-09 DIAGNOSIS — M545 Low back pain: Secondary | ICD-10-CM | POA: Diagnosis not present

## 2013-07-09 DIAGNOSIS — G894 Chronic pain syndrome: Secondary | ICD-10-CM | POA: Diagnosis not present

## 2013-07-09 DIAGNOSIS — M961 Postlaminectomy syndrome, not elsewhere classified: Secondary | ICD-10-CM | POA: Diagnosis not present

## 2013-07-11 ENCOUNTER — Ambulatory Visit (INDEPENDENT_AMBULATORY_CARE_PROVIDER_SITE_OTHER): Payer: Medicare Other | Admitting: Pharmacist

## 2013-07-11 DIAGNOSIS — F329 Major depressive disorder, single episode, unspecified: Secondary | ICD-10-CM

## 2013-07-11 MED ORDER — MEMANTINE HCL ER 28 MG PO CP24: 28.0000 mg | ORAL_CAPSULE | ORAL | Status: DC

## 2013-07-11 NOTE — Progress Notes (Signed)
Patient's husband brings in pill boxes to fill for 2 weeks.  She has recently been prescribed prednisone Monday 07/10/13 by orthopedist.  Per husband she took 1 tablet on Monday but has not taken any more because they were unclear on directions.  Included prednisone with pill boxes and reviewed with patient's husband twice how to take the prednisone taper over the next 6 days.  Medications reviewed and patient's pill boxes filled.

## 2013-07-17 DIAGNOSIS — F4323 Adjustment disorder with mixed anxiety and depressed mood: Secondary | ICD-10-CM | POA: Diagnosis not present

## 2013-07-23 ENCOUNTER — Encounter: Payer: Self-pay | Admitting: Pharmacist

## 2013-07-23 ENCOUNTER — Ambulatory Visit (INDEPENDENT_AMBULATORY_CARE_PROVIDER_SITE_OTHER): Payer: Medicare Other | Admitting: Pharmacist

## 2013-07-23 DIAGNOSIS — F329 Major depressive disorder, single episode, unspecified: Secondary | ICD-10-CM

## 2013-07-23 DIAGNOSIS — F3289 Other specified depressive episodes: Secondary | ICD-10-CM

## 2013-07-23 NOTE — Progress Notes (Signed)
No charge for visit.  Patinet's husband brought in pill boxes to fill for next 2 weeks.  Cherre Robins, PharmD, CPP

## 2013-08-06 ENCOUNTER — Ambulatory Visit (INDEPENDENT_AMBULATORY_CARE_PROVIDER_SITE_OTHER): Payer: Medicare Other | Admitting: Pharmacist

## 2013-08-06 ENCOUNTER — Encounter: Payer: Self-pay | Admitting: Pharmacist

## 2013-08-06 DIAGNOSIS — F3289 Other specified depressive episodes: Secondary | ICD-10-CM

## 2013-08-06 DIAGNOSIS — F329 Major depressive disorder, single episode, unspecified: Secondary | ICD-10-CM

## 2013-08-06 NOTE — Progress Notes (Signed)
Filled patient's pill boxes

## 2013-08-09 DIAGNOSIS — F4323 Adjustment disorder with mixed anxiety and depressed mood: Secondary | ICD-10-CM | POA: Diagnosis not present

## 2013-08-20 ENCOUNTER — Ambulatory Visit (INDEPENDENT_AMBULATORY_CARE_PROVIDER_SITE_OTHER): Payer: Medicare Other | Admitting: Pharmacist

## 2013-08-20 DIAGNOSIS — F329 Major depressive disorder, single episode, unspecified: Secondary | ICD-10-CM

## 2013-08-20 DIAGNOSIS — F3289 Other specified depressive episodes: Secondary | ICD-10-CM

## 2013-08-20 NOTE — Progress Notes (Signed)
Patient's husband brought in pill boxes and filled for 2 weeks.

## 2013-08-23 ENCOUNTER — Ambulatory Visit (INDEPENDENT_AMBULATORY_CARE_PROVIDER_SITE_OTHER): Payer: Medicare Other | Admitting: Family Medicine

## 2013-08-23 ENCOUNTER — Encounter: Payer: Self-pay | Admitting: Family Medicine

## 2013-08-23 VITALS — BP 162/83 | HR 71 | Temp 98.2°F | Ht 64.5 in | Wt 162.0 lb

## 2013-08-23 DIAGNOSIS — I10 Essential (primary) hypertension: Secondary | ICD-10-CM | POA: Diagnosis not present

## 2013-08-23 DIAGNOSIS — Z23 Encounter for immunization: Secondary | ICD-10-CM | POA: Diagnosis not present

## 2013-08-23 DIAGNOSIS — F3289 Other specified depressive episodes: Secondary | ICD-10-CM | POA: Diagnosis not present

## 2013-08-23 DIAGNOSIS — F32A Depression, unspecified: Secondary | ICD-10-CM

## 2013-08-23 DIAGNOSIS — R29818 Other symptoms and signs involving the nervous system: Secondary | ICD-10-CM

## 2013-08-23 DIAGNOSIS — F329 Major depressive disorder, single episode, unspecified: Secondary | ICD-10-CM | POA: Diagnosis not present

## 2013-08-23 DIAGNOSIS — E785 Hyperlipidemia, unspecified: Secondary | ICD-10-CM | POA: Diagnosis not present

## 2013-08-23 DIAGNOSIS — R2689 Other abnormalities of gait and mobility: Secondary | ICD-10-CM

## 2013-08-23 DIAGNOSIS — E559 Vitamin D deficiency, unspecified: Secondary | ICD-10-CM

## 2013-08-23 DIAGNOSIS — J45909 Unspecified asthma, uncomplicated: Secondary | ICD-10-CM

## 2013-08-23 LAB — POCT CBC
Granulocyte percent: 59.3 %G (ref 37–80)
HEMATOCRIT: 40.2 % (ref 37.7–47.9)
HEMOGLOBIN: 12.8 g/dL (ref 12.2–16.2)
Lymph, poc: 2.5 (ref 0.6–3.4)
MCH: 29.6 pg (ref 27–31.2)
MCHC: 31.9 g/dL (ref 31.8–35.4)
MCV: 93 fL (ref 80–97)
MPV: 7.3 fL (ref 0–99.8)
POC Granulocyte: 4.1 (ref 2–6.9)
POC LYMPH PERCENT: 36.1 %L (ref 10–50)
Platelet Count, POC: 387 10*3/uL (ref 142–424)
RBC: 4.3 M/uL (ref 4.04–5.48)
RDW, POC: 14.6 %
WBC: 6.9 10*3/uL (ref 4.6–10.2)

## 2013-08-23 NOTE — Progress Notes (Signed)
Subjective:    Patient ID: Traci Wheeler, female    DOB: 08-22-1927, 80 y.o.   MRN: [id number]  HPI Pt here for follow up and management of chronic medical problems. Initially the patient appears that she is stable still somewhat anxious about her memory and still issues with her depression but overall stable. Her breathing is stable. She is to get a DEXA scan, and FOBT, lab work, and a Arboriculturist. She will be scheduled for a pelvic exam.         Patient Active Problem List   Diagnosis Date Noted  . Hypertension 12/14/2012  . Hyperlipemia 12/14/2012  . Depression 10/04/2012  . ALLERGIC RHINITIS 07/19/2007  . ASTHMA 07/19/2007  . ESOPHAGEAL REFLUX 07/19/2007   Outpatient Encounter Prescriptions as of 08/23/2013  Medication Sig  . albuterol (PROVENTIL HFA;VENTOLIN HFA) 108 (90 BASE) MCG/ACT inhaler Inhale 2 puffs into the lungs every 6 (six) hours as needed for wheezing.  Marland Kitchen amLODipine (NORVASC) 10 MG tablet Take 10 mg by mouth daily.    Marland Kitchen buPROPion (WELLBUTRIN XL) 300 MG 24 hr tablet Take 300 mg by mouth every morning.  . busPIRone (BUSPAR) 15 MG tablet Take 15 mg by mouth daily.  . cholecalciferol (VITAMIN D) 1000 UNITS tablet Take 2,000 Units by mouth daily.  Marland Kitchen donepezil (ARICEPT) 10 MG tablet Take 1 tablet (10 mg total) by mouth at bedtime.  . DULoxetine (CYMBALTA) 60 MG capsule Take 1 capsule (60 mg total) by mouth daily.  Marland Kitchen ezetimibe-simvastatin (VYTORIN) 10-40 MG per tablet 1/2 tablet daily in pm  . Fluticasone-Salmeterol (ADVAIR) 250-50 MCG/DOSE AEPB Inhale 1 puff into the lungs every 12 (twelve) hours.  Marland Kitchen lamoTRIgine (LAMICTAL) 25 MG tablet Take 2 tablets (50 mg total) by mouth 2 (two) times daily.  Marland Kitchen lurasidone (LATUDA) 40 MG TABS Take 20 mg by mouth at bedtime.   . Memantine HCl ER (NAMENDA XR) 28 MG CP24 Take 28 mg by mouth every morning.  . mirtazapine (REMERON) 15 MG tablet Take 1 tablet (15 mg total) by mouth at bedtime. 1/2 tablet daily for 7 days, then 1  tablet daily thereafter.  . mometasone (NASONEX) 50 MCG/ACT nasal spray Place 2 sprays into the nose as needed.    Review of Systems  Constitutional: Negative.   HENT: Negative.   Eyes: Negative.   Respiratory: Negative.   Cardiovascular: Negative.   Gastrointestinal: Negative.   Endocrine: Negative.   Genitourinary: Negative.   Musculoskeletal: Positive for arthralgias (left knee pain ) and back pain.  Skin: Negative.   Allergic/Immunologic: Negative.   Neurological: Negative.   Hematological: Negative.   Psychiatric/Behavioral: Positive for confusion (pt feels that memory is worse). The patient is nervous/anxious (crying and depression seems about the same as last visit -per pt).        Objective:   Physical Exam  Nursing note and vitals reviewed. Constitutional: She is oriented to person, place, and time. She appears well-developed and well-nourished. She appears distressed (patient is always quite anxious cause of her memory issues).  HENT:  Head: Normocephalic and atraumatic.  Right Ear: External ear normal.  Left Ear: External ear normal.  Nose: Nose normal.  Mouth/Throat: Oropharynx is clear and moist.  Eyes: Conjunctivae and EOM are normal. Pupils are equal, round, and reactive to light. Right eye exhibits no discharge. Left eye exhibits no discharge. No scleral icterus.  Neck: Normal range of motion. Neck supple. No thyromegaly present.  No carotid bruits or thyroid enlargement  Cardiovascular: Normal rate,  regular rhythm and normal heart sounds.  Exam reveals no gallop and no friction rub.   No murmur heard. At 72 per minute  Pulmonary/Chest: Effort normal and breath sounds normal. No respiratory distress. She has no wheezes. She has no rales. She exhibits no tenderness.  Abdominal: Soft. Bowel sounds are normal. She exhibits no mass. There is tenderness. There is no rebound and no guarding.  Patient has an umbilical hernia and slight epigastric tenderness    Musculoskeletal: Normal range of motion. She exhibits no edema and no tenderness.  Lymphadenopathy:    She has no cervical adenopathy.  Neurological: She is alert and oriented to person, place, and time. She has normal reflexes. No cranial nerve deficit.  Skin: Skin is warm and dry.  Psychiatric: Her behavior is normal. Judgment and thought content normal.  Depressed affect as usual but stable   BP 162/83  Pulse 71  Temp(Src) 98.2 F (36.8 C) (Oral)  Ht 5' 4.5" (1.638 m)  Wt 162 lb (73.483 kg)  BMI 27.39 kg/m2        Assessment & Plan:  1. ASTHMA - POCT CBC  2. Depression - POCT CBC  3. Hypertension - POCT CBC - BMP8+EGFR - Hepatic function panel  4. Hyperlipemia - POCT CBC - NMR, lipoprofile  5. Vitamin D deficiency - Vit D  25 hydroxy (rtn osteoporosis monitoring)  6. Balance problem - Ambulatory referral to Physical Therapy  Patient Instructions  Continue current medications. Continue good therapeutic lifestyle changes which include good diet and exercise. Fall precautions discussed with patient. Schedule your flu vaccine if you haven't had it yet If you are over 45 years old - you may need Prevnar 103 or the adult Pneumonia vaccine. Arrange for you to go for physical therapy--we will arrange this Continue to drink plenty of fluids   Arrie Senate MD

## 2013-08-23 NOTE — Patient Instructions (Addendum)
Continue current medications. Continue good therapeutic lifestyle changes which include good diet and exercise. Fall precautions discussed with patient. Schedule your flu vaccine if you haven't had it yet If you are over 78 years old - you may need Prevnar 58 or the adult Pneumonia vaccine. Arrange for you to go for physical therapy--we will arrange this Continue to drink plenty of fluids

## 2013-08-24 LAB — NMR, LIPOPROFILE
CHOLESTEROL: 140 mg/dL (ref <200)
HDL Cholesterol by NMR: 56 mg/dL (ref >=40)
HDL Particle Number: 40 umol/L (ref >=30.5)
LDL Particle Number: 714 nmol/L (ref <1000)
LDL SIZE: 20.8 nm (ref >20.5)
LDLC SERPL CALC-MCNC: 57 mg/dL (ref <100)
LP-IR Score: 52 — ABNORMAL HIGH (ref <=45)
SMALL LDL PARTICLE NUMBER: 427 nmol/L (ref <=527)
Triglycerides by NMR: 136 mg/dL (ref <150)

## 2013-08-24 LAB — BMP8+EGFR
BUN/Creatinine Ratio: 13 (ref 11–26)
BUN: 16 mg/dL (ref 8–27)
CO2: 27 mmol/L (ref 18–29)
Calcium: 10.5 mg/dL — ABNORMAL HIGH (ref 8.7–10.3)
Chloride: 101 mmol/L (ref 97–108)
Creatinine, Ser: 1.26 mg/dL — ABNORMAL HIGH (ref 0.57–1.00)
GFR, EST AFRICAN AMERICAN: 46 mL/min/{1.73_m2} — AB (ref >59)
GFR, EST NON AFRICAN AMERICAN: 39 mL/min/{1.73_m2} — AB (ref >59)
Glucose: 99 mg/dL (ref 65–99)
Potassium: 5 mmol/L (ref 3.5–5.2)
SODIUM: 144 mmol/L (ref 134–144)

## 2013-08-24 LAB — HEPATIC FUNCTION PANEL
ALT: 10 IU/L (ref 0–32)
AST: 17 IU/L (ref 0–40)
Albumin: 4.6 g/dL (ref 3.5–4.7)
Alkaline Phosphatase: 98 IU/L (ref 39–117)
BILIRUBIN DIRECT: 0.1 mg/dL (ref 0.00–0.40)
BILIRUBIN TOTAL: 0.3 mg/dL (ref 0.0–1.2)
Total Protein: 6.5 g/dL (ref 6.0–8.5)

## 2013-08-24 LAB — VITAMIN D 25 HYDROXY (VIT D DEFICIENCY, FRACTURES): Vit D, 25-Hydroxy: 37.3 ng/mL (ref 30.0–100.0)

## 2013-08-27 ENCOUNTER — Ambulatory Visit: Payer: Self-pay

## 2013-08-27 NOTE — Addendum Note (Signed)
Addended by: Zannie Cove on: 08/27/2013 12:23 PM   Modules accepted: Orders

## 2013-08-29 ENCOUNTER — Other Ambulatory Visit: Payer: Medicare Other

## 2013-08-29 DIAGNOSIS — Z1212 Encounter for screening for malignant neoplasm of rectum: Secondary | ICD-10-CM

## 2013-08-30 ENCOUNTER — Encounter: Payer: Self-pay | Admitting: *Deleted

## 2013-08-30 LAB — FECAL OCCULT BLOOD, IMMUNOCHEMICAL: Fecal Occult Bld: NEGATIVE

## 2013-08-30 NOTE — Progress Notes (Signed)
Quick Note:  Copy of labs sent to patient ______ 

## 2013-09-03 ENCOUNTER — Ambulatory Visit (INDEPENDENT_AMBULATORY_CARE_PROVIDER_SITE_OTHER): Payer: Medicare Other | Admitting: Pharmacist

## 2013-09-03 ENCOUNTER — Encounter: Payer: Self-pay | Admitting: Pharmacist

## 2013-09-03 DIAGNOSIS — F329 Major depressive disorder, single episode, unspecified: Secondary | ICD-10-CM

## 2013-09-03 DIAGNOSIS — F32A Depression, unspecified: Secondary | ICD-10-CM

## 2013-09-03 DIAGNOSIS — F3289 Other specified depressive episodes: Secondary | ICD-10-CM

## 2013-09-03 MED ORDER — LAMOTRIGINE 25 MG PO TABS: 50.0000 mg | ORAL_TABLET | Freq: Two times a day (BID) | ORAL | Status: DC

## 2013-09-03 NOTE — Progress Notes (Signed)
Patient's husband brought in meds to fill pill boxes for 2 weeks.  Reminded to bring patient to next appt 09/17/13 to recheck BMET per Dr Laurance Flatten.

## 2013-09-10 ENCOUNTER — Ambulatory Visit: Payer: Self-pay

## 2013-09-10 DIAGNOSIS — F4323 Adjustment disorder with mixed anxiety and depressed mood: Secondary | ICD-10-CM | POA: Diagnosis not present

## 2013-09-17 ENCOUNTER — Ambulatory Visit (INDEPENDENT_AMBULATORY_CARE_PROVIDER_SITE_OTHER): Payer: Medicare Other | Admitting: Pharmacist

## 2013-09-17 ENCOUNTER — Encounter: Payer: Self-pay | Admitting: Pharmacist

## 2013-09-17 VITALS — BP 145/80 | HR 74

## 2013-09-17 DIAGNOSIS — I10 Essential (primary) hypertension: Secondary | ICD-10-CM

## 2013-09-17 DIAGNOSIS — F3289 Other specified depressive episodes: Secondary | ICD-10-CM

## 2013-09-17 DIAGNOSIS — F329 Major depressive disorder, single episode, unspecified: Secondary | ICD-10-CM | POA: Diagnosis not present

## 2013-09-17 DIAGNOSIS — R799 Abnormal finding of blood chemistry, unspecified: Secondary | ICD-10-CM

## 2013-09-17 NOTE — Progress Notes (Signed)
CC: elevated serum creatinine  HPI - patient is here to have her BMET recheck per Dr Tawanna Sat orders since serum creatinine was elevated 4 weeks ago when he checked.  She is also here to have pill boxes filled for 2 weeks.  Patient suffers from memory loss and depression and we have been filling pill boxes for about 16 months.  Filed Vitals:   09/17/13 0917  BP: 145/80  Pulse: 74    Assessment: Elevate serum creatinine Memory loss Depression  Plan: 1.  Filled pill boxes with current medications with the exception of vitamin D - patient has not been including this in her pill box but she states that she sometimes forgets so will bring it with next fill.  2.  Recheck serum creatinine per Dr Laurance Flatten Orders Placed This Encounter  Procedures  . BMP8+EGFR   RTC in 2 weeks  Cherre Robins, PharmD, CPP

## 2013-09-18 ENCOUNTER — Ambulatory Visit: Payer: Medicare Other | Attending: Family Medicine | Admitting: Physical Therapy

## 2013-09-18 DIAGNOSIS — R5381 Other malaise: Secondary | ICD-10-CM | POA: Insufficient documentation

## 2013-09-18 DIAGNOSIS — R42 Dizziness and giddiness: Secondary | ICD-10-CM | POA: Diagnosis not present

## 2013-09-18 DIAGNOSIS — IMO0001 Reserved for inherently not codable concepts without codable children: Secondary | ICD-10-CM | POA: Diagnosis not present

## 2013-09-18 LAB — BMP8+EGFR
BUN / CREAT RATIO: 12 (ref 11–26)
BUN: 14 mg/dL (ref 8–27)
CALCIUM: 10 mg/dL (ref 8.7–10.3)
CO2: 26 mmol/L (ref 18–29)
Chloride: 102 mmol/L (ref 97–108)
Creatinine, Ser: 1.19 mg/dL — ABNORMAL HIGH (ref 0.57–1.00)
GFR calc Af Amer: 49 mL/min/{1.73_m2} — ABNORMAL LOW (ref >59)
GFR calc non Af Amer: 42 mL/min/{1.73_m2} — ABNORMAL LOW (ref >59)
Glucose: 115 mg/dL — ABNORMAL HIGH (ref 65–99)
Potassium: 4 mmol/L (ref 3.5–5.2)
Sodium: 145 mmol/L — ABNORMAL HIGH (ref 134–144)

## 2013-09-19 ENCOUNTER — Telehealth: Payer: Self-pay | Admitting: Pharmacist

## 2013-09-19 NOTE — Telephone Encounter (Signed)
Patient aware of lab results from 09/17/13

## 2013-09-20 ENCOUNTER — Ambulatory Visit: Payer: Medicare Other | Admitting: Physical Therapy

## 2013-09-21 ENCOUNTER — Telehealth: Payer: Self-pay | Admitting: Pharmacist

## 2013-09-24 ENCOUNTER — Ambulatory Visit: Payer: Medicare Other | Admitting: Physical Therapy

## 2013-09-24 NOTE — Telephone Encounter (Signed)
Patient comes every 2 weeks to have pill boxes fill and I call [facility] with refills or new rx's as needed.  Last Monday 09/19/13 while the patient was in the room I called in 3 medications that needed to be refilled (there were refills left so no Rx will show on medication report).  When Traci Wheeler went to [facility] to pick them up Friday 09/21/13 they were not done.  I called [facility] today and they have been filled and I confirmed with patient that he has the medciations now.

## 2013-09-28 ENCOUNTER — Ambulatory Visit: Payer: Medicare Other | Admitting: Physical Therapy

## 2013-10-01 ENCOUNTER — Ambulatory Visit (INDEPENDENT_AMBULATORY_CARE_PROVIDER_SITE_OTHER): Payer: Medicare Other | Admitting: Pharmacist

## 2013-10-01 ENCOUNTER — Ambulatory Visit (INDEPENDENT_AMBULATORY_CARE_PROVIDER_SITE_OTHER): Payer: Medicare Other | Admitting: Family Medicine

## 2013-10-01 ENCOUNTER — Encounter: Payer: Self-pay | Admitting: Family Medicine

## 2013-10-01 VITALS — BP 139/75 | HR 80 | Temp 99.2°F | Ht 64.5 in | Wt 158.0 lb

## 2013-10-01 DIAGNOSIS — F3289 Other specified depressive episodes: Secondary | ICD-10-CM | POA: Diagnosis not present

## 2013-10-01 DIAGNOSIS — R630 Anorexia: Secondary | ICD-10-CM

## 2013-10-01 DIAGNOSIS — F329 Major depressive disorder, single episode, unspecified: Secondary | ICD-10-CM

## 2013-10-01 DIAGNOSIS — N390 Urinary tract infection, site not specified: Secondary | ICD-10-CM | POA: Diagnosis not present

## 2013-10-01 DIAGNOSIS — R5381 Other malaise: Secondary | ICD-10-CM | POA: Insufficient documentation

## 2013-10-01 DIAGNOSIS — I10 Essential (primary) hypertension: Secondary | ICD-10-CM

## 2013-10-01 DIAGNOSIS — R5383 Other fatigue: Secondary | ICD-10-CM

## 2013-10-01 DIAGNOSIS — F32A Depression, unspecified: Secondary | ICD-10-CM

## 2013-10-01 DIAGNOSIS — R634 Abnormal weight loss: Secondary | ICD-10-CM | POA: Diagnosis not present

## 2013-10-01 LAB — POCT URINALYSIS DIPSTICK
BILIRUBIN UA: NEGATIVE
GLUCOSE UA: NEGATIVE
NITRITE UA: NEGATIVE
Spec Grav, UA: >=1.03
Urobilinogen, UA: NEGATIVE
pH, UA: 5

## 2013-10-01 LAB — POCT CBC
Granulocyte percent: 63.1 %G (ref 37–80)
HCT, POC: 42.9 % (ref 37.7–47.9)
HEMOGLOBIN: 13.5 g/dL (ref 12.2–16.2)
LYMPH, POC: 2.3 (ref 0.6–3.4)
MCH, POC: 29.6 pg (ref 27–31.2)
MCHC: 31.6 g/dL — AB (ref 31.8–35.4)
MCV: 93.8 fL (ref 80–97)
MPV: 6.9 fL (ref 0–99.8)
PLATELET COUNT, POC: 424 10*3/uL (ref 142–424)
POC Granulocyte: 5.3 (ref 2–6.9)
POC LYMPH PERCENT: 27.3 %L (ref 10–50)
RBC: 4.6 M/uL (ref 4.04–5.48)
RDW, POC: 14 %
WBC: 8.4 10*3/uL (ref 4.6–10.2)

## 2013-10-01 LAB — POCT UA - MICROSCOPIC ONLY
Casts, Ur, LPF, POC: NEGATIVE
Crystals, Ur, HPF, POC: NEGATIVE
Mucus, UA: NEGATIVE
YEAST UA: NEGATIVE

## 2013-10-01 LAB — POCT GLYCOSYLATED HEMOGLOBIN (HGB A1C): Hemoglobin A1C: 5.4

## 2013-10-01 LAB — GLUCOSE, POCT (MANUAL RESULT ENTRY): POC GLUCOSE: 92 mg/dL (ref 70–99)

## 2013-10-01 MED ORDER — CIPROFLOXACIN HCL 250 MG PO TABS: 250.0000 mg | ORAL_TABLET | Freq: Two times a day (BID) | ORAL | Status: DC

## 2013-10-01 MED ORDER — PANTOPRAZOLE SODIUM 40 MG PO TBEC: 40.0000 mg | DELAYED_RELEASE_TABLET | Freq: Every day | ORAL | Status: DC

## 2013-10-01 NOTE — Patient Instructions (Addendum)
Restart Protonix 40 everyday- pick this prescription up at [facility] and bring it to [place] on Tuesday!!!!!!!!! We will cut back on the memory pill- Aricept for a few weeks to see if this helps. Drink plenty of fluids Also bring the prescription antibiotic by tomorrow and we will fix this for you to take twice a day for 7 days.

## 2013-10-01 NOTE — Progress Notes (Signed)
Subjective:    Patient ID: Traci Wheeler, female    DOB: 1927/11/20, 80 y.o.   MRN: [id number]  HPI Patient here today for decrease in appetite. The patient mainly complains with her back hurting. Her husband comes with her to the visit today. She has been walking around the house. She denies any nausea vomiting or trouble passing her water. She also denies cough and congestion.        Patient Active Problem List   Diagnosis Date Noted  . Hypertension 12/14/2012  . Hyperlipemia 12/14/2012  . Depression 10/04/2012  . ALLERGIC RHINITIS 07/19/2007  . ASTHMA 07/19/2007  . ESOPHAGEAL REFLUX 07/19/2007   Outpatient Encounter Prescriptions as of 10/01/2013  Medication Sig  . albuterol (PROVENTIL HFA;VENTOLIN HFA) 108 (90 BASE) MCG/ACT inhaler Inhale 2 puffs into the lungs every 6 (six) hours as needed for wheezing.  Marland Kitchen amLODipine (NORVASC) 10 MG tablet Take 10 mg by mouth daily.    Marland Kitchen buPROPion (WELLBUTRIN XL) 300 MG 24 hr tablet Take 300 mg by mouth every morning.  . busPIRone (BUSPAR) 15 MG tablet Take 15 mg by mouth daily.  . cholecalciferol (VITAMIN D) 1000 UNITS tablet Take 2,000 Units by mouth daily.  Marland Kitchen donepezil (ARICEPT) 10 MG tablet Take 1 tablet (10 mg total) by mouth at bedtime.  . DULoxetine (CYMBALTA) 60 MG capsule Take 1 capsule (60 mg total) by mouth daily.  Marland Kitchen ezetimibe-simvastatin (VYTORIN) 10-40 MG per tablet 1/2 tablet daily in pm  . lamoTRIgine (LAMICTAL) 25 MG tablet Take 2 tablets (50 mg total) by mouth 2 (two) times daily.  Marland Kitchen lurasidone (LATUDA) 40 MG TABS Take 20 mg by mouth at bedtime.   . Memantine HCl ER (NAMENDA XR) 28 MG CP24 Take 28 mg by mouth every morning.  . mirtazapine (REMERON) 15 MG tablet Take 1 tablet (15 mg total) by mouth at bedtime. 1/2 tablet daily for 7 days, then 1 tablet daily thereafter.    Review of Systems  Constitutional: Positive for appetite change and unexpected weight change.  HENT: Negative.   Eyes: Positive for visual  disturbance (double vision at times).  Respiratory: Negative.   Cardiovascular: Negative.   Gastrointestinal: Negative.   Endocrine: Negative.   Genitourinary: Negative.   Musculoskeletal: Negative.   Skin: Negative.   Allergic/Immunologic: Negative.   Neurological: Positive for light-headedness (at times- doesn't last long each time).  Hematological: Negative.   Psychiatric/Behavioral: Positive for confusion. The patient is nervous/anxious (depression).        Objective:   Physical Exam  Nursing note and vitals reviewed. Constitutional: She is oriented to person, place, and time. She appears well-developed and well-nourished. She appears distressed (mildly distressed as because of her memory issues appear).  HENT:  Head: Normocephalic and atraumatic.  Right Ear: External ear normal.  Left Ear: External ear normal.  Nose: Nose normal.  Mouth/Throat: Oropharynx is clear and moist. No oropharyngeal exudate.  Eyes: Conjunctivae and EOM are normal. Pupils are equal, round, and reactive to light. Right eye exhibits no discharge. Left eye exhibits no discharge. No scleral icterus.  Neck: Normal range of motion. Neck supple. No JVD present. No thyromegaly present.  Cardiovascular: Normal rate, regular rhythm, normal heart sounds and intact distal pulses.  Exam reveals no gallop and no friction rub.   No murmur heard. Pulmonary/Chest: Effort normal and breath sounds normal. She has no wheezes. She has no rales.  Abdominal: Soft. Bowel sounds are normal. She exhibits no mass. There is no tenderness. There is  no rebound and no guarding.  No suprapubic tenderness ,Umbilical hernia  Musculoskeletal: Normal range of motion. She exhibits no edema and no tenderness.  Lymphadenopathy:    She has no cervical adenopathy.  Neurological: She is alert and oriented to person, place, and time.  Skin: Skin is warm and dry.  Psychiatric: Her behavior is normal. Thought content normal.  Somewhat flat and  distant psychologically   BP 139/75  Pulse 80  Temp(Src) 99.2 F (37.3 C) (Oral)  Ht 5' 4.5" (1.638 m)  Wt 158 lb (71.668 kg)  BMI 26.71 kg/m2  Results for orders placed in visit on 10/01/13  POCT GLYCOSYLATED HEMOGLOBIN (HGB A1C)      Result Value Ref Range   Hemoglobin A1C 5.4%    GLUCOSE, POCT (MANUAL RESULT ENTRY)      Result Value Ref Range   POC Glucose 92  70 - 99 mg/dl  POCT UA - MICROSCOPIC ONLY      Result Value Ref Range   WBC, Ur, HPF, POC tntc     RBC, urine, microscopic 5-8     Bacteria, U Microscopic moderate     Mucus, UA negative     Epithelial cells, urine per micros few     Crystals, Ur, HPF, POC negative     Casts, Ur, LPF, POC negative     Yeast, UA negative    POCT URINALYSIS DIPSTICK      Result Value Ref Range   Color, UA gold     Clarity, UA clear     Glucose, UA negative     Bilirubin, UA negative     Ketones, UA large     Spec Grav, UA >=1.030     Blood, UA trace     pH, UA 5.0     Protein, UA 4+     Urobilinogen, UA negative     Nitrite, UA negative     Leukocytes, UA large (3+)    POCT CBC      Result Value Ref Range   WBC 8.4  4.6 - 10.2 K/uL   Lymph, poc 2.3  0.6 - 3.4   POC LYMPH PERCENT 27.3  10 - 50 %L   POC Granulocyte 5.3  2 - 6.9   Granulocyte percent 63.1  37 - 80 %G   RBC 4.6  4.04 - 5.48 M/uL   Hemoglobin 13.5  12.2 - 16.2 g/dL   HCT, POC 42.9  37.7 - 47.9 %   MCV 93.8  80 - 97 fL   MCH, POC 29.6  27 - 31.2 pg   MCHC 31.6 (*) 31.8 - 35.4 g/dL   RDW, POC 14.0     Platelet Count, POC 424.0  142 - 424 K/uL   MPV 6.9  0 - 99.8 fL         Assessment & Plan:   1. Loss of weight - POCT glycosylated hemoglobin (Hb A1C) - POCT glucose (manual entry) - POCT UA - Microscopic Only - POCT urinalysis dipstick  2. Loss of appetite - POCT glycosylated hemoglobin (Hb A1C) - POCT glucose (manual entry) - POCT UA - Microscopic Only - POCT urinalysis dipstick  3. Depression - POCT UA - Microscopic Only - POCT urinalysis  dipstick  4. Hypertension - POCT UA - Microscopic Only - POCT urinalysis dipstick  5. Malaise and fatigue - POCT UA - Microscopic Only - POCT urinalysis dipstick  6. Fatigue - POCT CBC  7. Urinary tract infection, site not specified -  Urine culture  Patient Instructions  Restart Protonix 40 everyday- pick this prescription up at [facility] and bring it to [place] on Tuesday!!!!!!!!! We will cut back on the memory pill- Aricept for a few weeks to see if this helps. Drink plenty of fluids Also bring the prescription antibiotic by tomorrow and we will fix this for you to take twice a day for 7 days.   Arrie Senate MD

## 2013-10-01 NOTE — Progress Notes (Signed)
Filled pill boxes brought in by patient's husband. 

## 2013-10-02 ENCOUNTER — Ambulatory Visit: Payer: Medicare Other | Admitting: *Deleted

## 2013-10-02 LAB — URINE CULTURE

## 2013-10-03 ENCOUNTER — Telehealth: Payer: Self-pay | Admitting: Nurse Practitioner

## 2013-10-04 ENCOUNTER — Ambulatory Visit: Payer: Medicare Other | Admitting: Physical Therapy

## 2013-10-05 NOTE — Telephone Encounter (Signed)
Only if there is some abnormal test result in their like an abnormal pap or something

## 2013-10-05 NOTE — Telephone Encounter (Signed)
Patient aware.

## 2013-10-08 ENCOUNTER — Ambulatory Visit (INDEPENDENT_AMBULATORY_CARE_PROVIDER_SITE_OTHER): Payer: Medicare Other | Admitting: Pharmacist

## 2013-10-08 ENCOUNTER — Ambulatory Visit: Payer: Medicare Other | Admitting: Physical Therapy

## 2013-10-08 DIAGNOSIS — F329 Major depressive disorder, single episode, unspecified: Secondary | ICD-10-CM

## 2013-10-08 DIAGNOSIS — F3289 Other specified depressive episodes: Secondary | ICD-10-CM

## 2013-10-08 MED ORDER — MEMANTINE HCL ER 28 MG PO CP24: 28.0000 mg | ORAL_CAPSULE | ORAL | Status: DC

## 2013-10-08 NOTE — Progress Notes (Signed)
Pill boxes filled and refills requested as needed

## 2013-10-09 DIAGNOSIS — F4323 Adjustment disorder with mixed anxiety and depressed mood: Secondary | ICD-10-CM | POA: Diagnosis not present

## 2013-10-10 ENCOUNTER — Ambulatory Visit: Payer: Medicare Other | Attending: Family Medicine | Admitting: Physical Therapy

## 2013-10-10 DIAGNOSIS — IMO0001 Reserved for inherently not codable concepts without codable children: Secondary | ICD-10-CM | POA: Insufficient documentation

## 2013-10-10 DIAGNOSIS — R5381 Other malaise: Secondary | ICD-10-CM | POA: Insufficient documentation

## 2013-10-10 DIAGNOSIS — R42 Dizziness and giddiness: Secondary | ICD-10-CM | POA: Insufficient documentation

## 2013-10-15 ENCOUNTER — Encounter: Payer: Medicare Other | Admitting: Physical Therapy

## 2013-10-18 ENCOUNTER — Encounter: Payer: Medicare Other | Admitting: Physical Therapy

## 2013-10-22 ENCOUNTER — Ambulatory Visit (INDEPENDENT_AMBULATORY_CARE_PROVIDER_SITE_OTHER): Payer: Medicare Other | Admitting: Pharmacist

## 2013-10-22 DIAGNOSIS — F0390 Unspecified dementia without behavioral disturbance: Secondary | ICD-10-CM

## 2013-10-22 NOTE — Progress Notes (Signed)
Filled pill boxes brought in by patient's husband.

## 2013-10-23 ENCOUNTER — Ambulatory Visit: Payer: Medicare Other | Admitting: Physical Therapy

## 2013-10-23 DIAGNOSIS — R5381 Other malaise: Secondary | ICD-10-CM | POA: Diagnosis not present

## 2013-10-23 DIAGNOSIS — IMO0001 Reserved for inherently not codable concepts without codable children: Secondary | ICD-10-CM | POA: Diagnosis not present

## 2013-10-23 DIAGNOSIS — R42 Dizziness and giddiness: Secondary | ICD-10-CM | POA: Diagnosis not present

## 2013-10-24 ENCOUNTER — Ambulatory Visit (INDEPENDENT_AMBULATORY_CARE_PROVIDER_SITE_OTHER): Payer: Medicare Other | Admitting: Family Medicine

## 2013-10-24 ENCOUNTER — Encounter: Payer: Self-pay | Admitting: Family Medicine

## 2013-10-24 VITALS — BP 139/79 | HR 79 | Temp 97.4°F | Ht 64.5 in | Wt 162.0 lb

## 2013-10-24 DIAGNOSIS — R61 Generalized hyperhidrosis: Secondary | ICD-10-CM

## 2013-10-24 DIAGNOSIS — K5900 Constipation, unspecified: Secondary | ICD-10-CM | POA: Diagnosis not present

## 2013-10-24 DIAGNOSIS — F05 Delirium due to known physiological condition: Secondary | ICD-10-CM | POA: Diagnosis not present

## 2013-10-24 DIAGNOSIS — R630 Anorexia: Secondary | ICD-10-CM

## 2013-10-24 DIAGNOSIS — R42 Dizziness and giddiness: Secondary | ICD-10-CM | POA: Diagnosis not present

## 2013-10-24 DIAGNOSIS — N390 Urinary tract infection, site not specified: Secondary | ICD-10-CM | POA: Diagnosis not present

## 2013-10-24 DIAGNOSIS — F0390 Unspecified dementia without behavioral disturbance: Secondary | ICD-10-CM | POA: Diagnosis not present

## 2013-10-24 LAB — POCT CBC
Granulocyte percent: 67.3 %G (ref 37–80)
HCT, POC: 38.1 % (ref 37.7–47.9)
Hemoglobin: 12 g/dL — AB (ref 12.2–16.2)
Lymph, poc: 1.6 (ref 0.6–3.4)
MCH: 29.4 pg (ref 27–31.2)
MCHC: 31.4 g/dL — AB (ref 31.8–35.4)
MCV: 93.6 fL (ref 80–97)
MPV: 6.8 fL (ref 0–99.8)
POC GRANULOCYTE: 4.2 (ref 2–6.9)
POC LYMPH PERCENT: 25.9 %L (ref 10–50)
Platelet Count, POC: 409 10*3/uL (ref 142–424)
RBC: 4.1 M/uL (ref 4.04–5.48)
RDW, POC: 14.9 %
WBC: 6.2 10*3/uL (ref 4.6–10.2)

## 2013-10-24 LAB — POCT UA - MICROSCOPIC ONLY
BACTERIA, U MICROSCOPIC: NEGATIVE
Casts, Ur, LPF, POC: NEGATIVE
Crystals, Ur, HPF, POC: NEGATIVE
Mucus, UA: NEGATIVE
RBC, urine, microscopic: NEGATIVE
YEAST UA: NEGATIVE

## 2013-10-24 LAB — POCT URINALYSIS DIPSTICK
BILIRUBIN UA: NEGATIVE
Blood, UA: NEGATIVE
Glucose, UA: NEGATIVE
KETONES UA: NEGATIVE
Nitrite, UA: NEGATIVE
PH UA: 6.5
PROTEIN UA: NEGATIVE
SPEC GRAV UA: 1.01
Urobilinogen, UA: NEGATIVE

## 2013-10-24 NOTE — Addendum Note (Signed)
Addended by: Pollyann Kennedy F on: 10/24/2013 04:57 PM   Modules accepted: Orders

## 2013-10-24 NOTE — Patient Instructions (Signed)
Make a special effort to try to drink 4-5 glasses of water daily Take MiraLax regularly for bowel movements Continue to take current medication We will call you with the results of the lab work once those results are available We will also let you know the results of the urinalysis once that is available

## 2013-10-24 NOTE — Progress Notes (Signed)
Subjective:    Patient ID: Traci Wheeler, female    DOB: 07/21/1927, 80 y.o.   MRN: [id number]  HPI Patient here today for follow up of loss of appetite, constipation, sadness and memory. The patient comes to the visit today with her husband. She describes a loss of appetite and constipation. Her weight has actually gone up from the last visit when it was 158 pounds to now 162 pounds. The patient describes having night sweats and lightheadedness at nighttime.        Patient Active Problem List   Diagnosis Date Noted  . Malaise and fatigue 10/01/2013  . Hypertension 12/14/2012  . Hyperlipemia 12/14/2012  . Depression 10/04/2012  . ALLERGIC RHINITIS 07/19/2007  . ASTHMA 07/19/2007  . ESOPHAGEAL REFLUX 07/19/2007   Outpatient Encounter Prescriptions as of 10/24/2013  Medication Sig  . albuterol (PROVENTIL HFA;VENTOLIN HFA) 108 (90 BASE) MCG/ACT inhaler Inhale 2 puffs into the lungs every 6 (six) hours as needed for wheezing.  Marland Kitchen amLODipine (NORVASC) 10 MG tablet Take 10 mg by mouth daily.    Marland Kitchen buPROPion (WELLBUTRIN XL) 300 MG 24 hr tablet Take 300 mg by mouth every morning.  . busPIRone (BUSPAR) 15 MG tablet Take 15 mg by mouth daily.  . cholecalciferol (VITAMIN D) 1000 UNITS tablet Take 2,000 Units by mouth daily.  Marland Kitchen donepezil (ARICEPT) 10 MG tablet Take 5 mg by mouth at bedtime.  . DULoxetine (CYMBALTA) 60 MG capsule Take 1 capsule (60 mg total) by mouth daily.  Marland Kitchen ezetimibe-simvastatin (VYTORIN) 10-40 MG per tablet 1/2 tablet daily in pm  . lamoTRIgine (LAMICTAL) 25 MG tablet Take 2 tablets (50 mg total) by mouth 2 (two) times daily.  Marland Kitchen lurasidone (LATUDA) 40 MG TABS Take 20 mg by mouth at bedtime.   . Memantine HCl ER (NAMENDA XR) 28 MG CP24 Take 28 mg by mouth every morning.  . mirtazapine (REMERON) 15 MG tablet Take 1 tablet (15 mg total) by mouth at bedtime. 1/2 tablet daily for 7 days, then 1 tablet daily thereafter.  . pantoprazole (PROTONIX) 40 MG tablet Take 1 tablet  (40 mg total) by mouth daily.    Review of Systems  Constitutional: Positive for appetite change (loss of appetite).  HENT: Negative.   Eyes: Negative.   Respiratory: Negative.   Cardiovascular: Negative.   Gastrointestinal: Positive for constipation.  Endocrine: Negative.   Musculoskeletal: Negative.   Skin: Negative.   Allergic/Immunologic: Negative.   Neurological: Negative.   Hematological: Negative.   Psychiatric/Behavioral: Positive for confusion.       Sadness       Objective:   Physical Exam  Nursing note and vitals reviewed. Constitutional: She is oriented to person, place, and time. She appears well-developed and well-nourished. No distress.  Appearance as usual  HENT:  Head: Normocephalic and atraumatic.  Right Ear: External ear normal.  Left Ear: External ear normal.  Nose: Nose normal.  Mouth/Throat: Oropharynx is clear and moist.  Eyes: Conjunctivae and EOM are normal. Pupils are equal, round, and reactive to light. Right eye exhibits no discharge. Left eye exhibits no discharge. No scleral icterus.  Neck: Normal range of motion. Neck supple. No thyromegaly present.  Cardiovascular: Normal rate, regular rhythm, normal heart sounds and intact distal pulses.   No murmur heard. Pulmonary/Chest: Effort normal and breath sounds normal. No respiratory distress. She has no wheezes. She has no rales. She exhibits no tenderness.  Abdominal: Soft. Bowel sounds are normal. She exhibits no mass. There is no tenderness.  There is no rebound and no guarding.  Musculoskeletal: Normal range of motion. She exhibits no edema.  Lymphadenopathy:    She has no cervical adenopathy.  Neurological: She is alert and oriented to person, place, and time.  Skin: Skin is warm and dry. No rash noted.  Psychiatric: Her behavior is normal. Judgment and thought content normal.  Affect is somewhat more positive today than usual She is a Research officer, [employer].   BP 139/79  Pulse 79  Temp(Src) 97.4 F  (36.3 C) (Oral)  Ht 5' 4.5" (1.638 m)  Wt 162 lb (73.483 kg)  BMI 27.39 kg/m2        Assessment & Plan:  1. Loss of appetite - POCT CBC - Hepatic function panel - BMP8+EGFR - Thyroid Panel With TSH - POCT urinalysis dipstick - POCT UA - Microscopic Only  2. Constipation - POCT CBC - Hepatic function panel - BMP8+EGFR - Thyroid Panel With TSH - POCT urinalysis dipstick - POCT UA - Microscopic Only  3. Acute confusional state - POCT urinalysis dipstick - POCT UA - Microscopic Only  4. Night sweats   5. Dizziness and giddiness  6. Dementia -Continue current medication  Patient Instructions  Make a special effort to try to drink 4-5 glasses of water daily Take MiraLax regularly for bowel movements Continue to take current medication We will call you with the results of the lab work once those results are available We will also let you know the results of the urinalysis once that is available    Arrie Senate MD

## 2013-10-25 ENCOUNTER — Encounter: Payer: Medicare Other | Admitting: Physical Therapy

## 2013-10-25 LAB — BMP8+EGFR
BUN/Creatinine Ratio: 13 (ref 11–26)
BUN: 16 mg/dL (ref 8–27)
CHLORIDE: 98 mmol/L (ref 97–108)
CO2: 26 mmol/L (ref 18–29)
Calcium: 10 mg/dL (ref 8.7–10.3)
Creatinine, Ser: 1.23 mg/dL — ABNORMAL HIGH (ref 0.57–1.00)
GFR calc Af Amer: 47 mL/min/{1.73_m2} — ABNORMAL LOW (ref >59)
GFR calc non Af Amer: 41 mL/min/{1.73_m2} — ABNORMAL LOW (ref >59)
Glucose: 81 mg/dL (ref 65–99)
POTASSIUM: 4 mmol/L (ref 3.5–5.2)
Sodium: 143 mmol/L (ref 134–144)

## 2013-10-25 LAB — HEPATIC FUNCTION PANEL
ALT: 11 IU/L (ref 0–32)
AST: 16 IU/L (ref 0–40)
Albumin: 4.4 g/dL (ref 3.5–4.7)
Alkaline Phosphatase: 96 IU/L (ref 39–117)
BILIRUBIN DIRECT: 0.11 mg/dL (ref 0.00–0.40)
Total Bilirubin: 0.3 mg/dL (ref 0.0–1.2)
Total Protein: 6.6 g/dL (ref 6.0–8.5)

## 2013-10-25 LAB — THYROID PANEL WITH TSH
Free Thyroxine Index: 1.5 (ref 1.2–4.9)
T3 Uptake Ratio: 23 % — ABNORMAL LOW (ref 24–39)
T4 TOTAL: 6.7 ug/dL (ref 4.5–12.0)
TSH: 2.08 u[IU]/mL (ref 0.450–4.500)

## 2013-10-26 ENCOUNTER — Other Ambulatory Visit (INDEPENDENT_AMBULATORY_CARE_PROVIDER_SITE_OTHER): Payer: Medicare Other

## 2013-10-26 DIAGNOSIS — E785 Hyperlipidemia, unspecified: Secondary | ICD-10-CM | POA: Diagnosis not present

## 2013-10-26 DIAGNOSIS — E559 Vitamin D deficiency, unspecified: Secondary | ICD-10-CM

## 2013-10-26 DIAGNOSIS — R5383 Other fatigue: Secondary | ICD-10-CM | POA: Diagnosis not present

## 2013-10-26 DIAGNOSIS — I10 Essential (primary) hypertension: Secondary | ICD-10-CM | POA: Diagnosis not present

## 2013-10-26 DIAGNOSIS — R5381 Other malaise: Secondary | ICD-10-CM | POA: Diagnosis not present

## 2013-10-26 LAB — POCT CBC
GRANULOCYTE PERCENT: 63 % (ref 37–80)
HEMATOCRIT: 39.4 % (ref 37.7–47.9)
Hemoglobin: 12.8 g/dL (ref 12.2–16.2)
Lymph, poc: 1.9 (ref 0.6–3.4)
MCH, POC: 30.4 pg (ref 27–31.2)
MCHC: 32.4 g/dL (ref 31.8–35.4)
MCV: 94 fL (ref 80–97)
MPV: 7 fL (ref 0–99.8)
PLATELET COUNT, POC: 383 10*3/uL (ref 142–424)
POC Granulocyte: 4 (ref 2–6.9)
POC LYMPH PERCENT: 30.5 %L (ref 10–50)
RBC: 4.2 M/uL (ref 4.04–5.48)
RDW, POC: 14.8 %
WBC: 6.3 10*3/uL (ref 4.6–10.2)

## 2013-10-26 LAB — BASIC METABOLIC PANEL WITH GFR
BUN: 16 mg/dL (ref 6–23)
CALCIUM: 10.3 mg/dL (ref 8.4–10.5)
CO2: 30 mEq/L (ref 19–32)
Chloride: 103 mEq/L (ref 96–112)
Creat: 1.33 mg/dL — ABNORMAL HIGH (ref 0.50–1.10)
GFR, EST AFRICAN AMERICAN: 43 mL/min — AB
GFR, EST NON AFRICAN AMERICAN: 37 mL/min — AB
GLUCOSE: 131 mg/dL — AB (ref 70–99)
POTASSIUM: 4.3 meq/L (ref 3.5–5.3)
SODIUM: 145 meq/L (ref 135–145)

## 2013-10-26 LAB — HEPATIC FUNCTION PANEL
ALT: 9 U/L (ref 0–35)
AST: 15 U/L (ref 0–37)
Albumin: 4.4 g/dL (ref 3.5–5.2)
Alkaline Phosphatase: 93 U/L (ref 39–117)
Bilirubin, Direct: 0.1 mg/dL (ref 0.0–0.3)
Indirect Bilirubin: 0.3 mg/dL (ref 0.2–1.2)
Total Bilirubin: 0.4 mg/dL (ref 0.2–1.2)
Total Protein: 6.9 g/dL (ref 6.0–8.3)

## 2013-10-26 LAB — URINE CULTURE

## 2013-10-26 NOTE — Progress Notes (Signed)
Patient came in for labs only.

## 2013-10-27 LAB — VITAMIN D 25 HYDROXY (VIT D DEFICIENCY, FRACTURES): VIT D 25 HYDROXY: 44 ng/mL (ref 30–89)

## 2013-10-29 LAB — NMR LIPOPROFILE WITH LIPIDS
Cholesterol, Total: 150 mg/dL (ref <200)
HDL PARTICLE NUMBER: 38.3 umol/L (ref >=30.5)
HDL Size: 9.1 nm — ABNORMAL LOW (ref >=9.2)
HDL-C: 60 mg/dL (ref >=40)
LARGE HDL: 8 umol/L (ref >=4.8)
LDL (calc): 64 mg/dL (ref <100)
LDL Particle Number: 871 nmol/L (ref <1000)
LDL Size: 21.1 nm (ref >20.5)
LP-IR Score: 30 (ref <=45)
Large VLDL-P: 1.6 nmol/L (ref <=2.7)
Small LDL Particle Number: 493 nmol/L (ref <=527)
Triglycerides: 129 mg/dL (ref <150)
VLDL Size: 42 nm (ref <=46.6)

## 2013-10-30 ENCOUNTER — Ambulatory Visit (INDEPENDENT_AMBULATORY_CARE_PROVIDER_SITE_OTHER): Payer: Medicare Other | Admitting: Nurse Practitioner

## 2013-10-30 ENCOUNTER — Encounter: Payer: Self-pay | Admitting: Nurse Practitioner

## 2013-10-30 VITALS — BP 156/80 | HR 75 | Temp 98.3°F | Ht 64.0 in | Wt 162.0 lb

## 2013-10-30 DIAGNOSIS — Z01419 Encounter for gynecological examination (general) (routine) without abnormal findings: Secondary | ICD-10-CM | POA: Diagnosis not present

## 2013-10-30 DIAGNOSIS — Z0000 Encounter for general adult medical examination without abnormal findings: Secondary | ICD-10-CM

## 2013-10-30 DIAGNOSIS — Z124 Encounter for screening for malignant neoplasm of cervix: Secondary | ICD-10-CM | POA: Diagnosis not present

## 2013-10-30 LAB — POCT URINALYSIS DIPSTICK
BILIRUBIN UA: NEGATIVE
GLUCOSE UA: NEGATIVE
Ketones, UA: NEGATIVE
Leukocytes, UA: NEGATIVE
NITRITE UA: NEGATIVE
PH UA: 5
Protein, UA: NEGATIVE
RBC UA: NEGATIVE
Urobilinogen, UA: NEGATIVE

## 2013-10-30 MED ORDER — CIPROFLOXACIN HCL 250 MG PO TABS: 250.0000 mg | ORAL_TABLET | Freq: Two times a day (BID) | ORAL | Status: DC

## 2013-10-30 NOTE — Progress Notes (Addendum)
   Subjective:    Patient ID: Traci Wheeler, female    DOB: September 15, 1927, 80 y.o.   MRN: [id number]  HPI Regular patient of Dr. Laurance Wheeler that is here today for PAP and breast exam- SHe is doing well today without complaints.    Review of Systems  Constitutional: Negative.   HENT: Negative.   Respiratory: Negative.   Cardiovascular: Negative.   Genitourinary: Negative.   Psychiatric/Behavioral: Negative.   All other systems reviewed and are negative.      Objective:   Physical Exam  Constitutional: She is oriented to person, place, and time. She appears well-nourished.  HENT:  Head: Normocephalic.  Right Ear: Hearing, tympanic membrane, external ear and ear canal normal.  Left Ear: Hearing, tympanic membrane, external ear and ear canal normal.  Nose: Nose normal.  Mouth/Throat: Uvula is midline and oropharynx is clear and moist.  Eyes: Conjunctivae and EOM are normal. Pupils are equal, round, and reactive to light.  Neck: Normal range of motion and full passive range of motion without pain. Neck supple. No JVD present. Carotid bruit is not present. No mass and no thyromegaly present.  Cardiovascular: Normal rate, normal heart sounds and intact distal pulses.   No murmur heard. Pulmonary/Chest: Effort normal and breath sounds normal. Right breast exhibits no inverted nipple, no mass, no nipple discharge, no skin change and no tenderness. Left breast exhibits no inverted nipple, no mass, no nipple discharge, no skin change and no tenderness.  Abdominal: Soft. Bowel sounds are normal. She exhibits no mass. There is no tenderness.  Large reducible inguinal hernia  Genitourinary: Vagina normal and uterus normal. Guaiac negative stool. No breast swelling, tenderness, discharge or bleeding.  bimanual exam-No adnexal masses or tenderness. Cervical stenosis No vaginal discharge  Musculoskeletal: Normal range of motion.  Lymphadenopathy:    She has no cervical adenopathy.  Neurological:  She is alert and oriented to person, place, and time.  Skin: Skin is warm and dry.  Tick removed form left side of neck- ( pt said only been there since yesterday  Psychiatric: She has a normal mood and affect. Her behavior is normal. Judgment and thought content normal.  Repeat herself often    BP 156/80  Pulse 75  Temp(Src) 98.3 F (36.8 C) (Oral)  Ht 5\' 4"  (1.626 m)  Wt 162 lb (73.483 kg)  BMI 27.79 kg/m2      Assessment & Plan:   1. Annual physical exam    Orders Placed This Encounter  Procedures  . POCT urinalysis dipstick  keep follow up appointments with Dr. Laurance Wheeler Labs pending Health maintenance reviewed Diet and exercise encouraged Continue all meds Follow up  In prn   [place], FNP

## 2013-10-30 NOTE — Patient Instructions (Signed)

## 2013-10-30 NOTE — Addendum Note (Signed)
Addended by: Ilean China on: 10/30/2013 09:01 AM   Modules accepted: Orders

## 2013-11-01 ENCOUNTER — Encounter: Payer: Self-pay | Admitting: *Deleted

## 2013-11-02 LAB — PAP IG (IMAGE GUIDED): PAP Smear Comment: 0

## 2013-11-05 ENCOUNTER — Encounter: Payer: Self-pay | Admitting: Pharmacist

## 2013-11-05 ENCOUNTER — Ambulatory Visit (INDEPENDENT_AMBULATORY_CARE_PROVIDER_SITE_OTHER): Payer: Medicare Other | Admitting: Pharmacist

## 2013-11-05 ENCOUNTER — Encounter: Payer: Medicare Other | Admitting: Family Medicine

## 2013-11-05 VITALS — Ht 64.0 in | Wt 163.0 lb

## 2013-11-05 DIAGNOSIS — N390 Urinary tract infection, site not specified: Secondary | ICD-10-CM | POA: Diagnosis not present

## 2013-11-05 DIAGNOSIS — R630 Anorexia: Secondary | ICD-10-CM

## 2013-11-05 DIAGNOSIS — Z79899 Other long term (current) drug therapy: Secondary | ICD-10-CM | POA: Diagnosis not present

## 2013-11-05 DIAGNOSIS — R5381 Other malaise: Secondary | ICD-10-CM | POA: Diagnosis not present

## 2013-11-05 DIAGNOSIS — R5383 Other fatigue: Secondary | ICD-10-CM

## 2013-11-05 LAB — POCT URINALYSIS DIPSTICK
Bilirubin, UA: NEGATIVE
Blood, UA: NEGATIVE
Glucose, UA: NEGATIVE
Ketones, UA: NEGATIVE
Leukocytes, UA: NEGATIVE
NITRITE UA: NEGATIVE
PH UA: 5
Protein, UA: NEGATIVE
Spec Grav, UA: >=1.03
UROBILINOGEN UA: NEGATIVE

## 2013-11-05 LAB — POCT UA - MICROSCOPIC ONLY
Bacteria, U Microscopic: NEGATIVE
CRYSTALS, UR, HPF, POC: NEGATIVE
Casts, Ur, LPF, POC: NEGATIVE
Mucus, UA: NEGATIVE
RBC, urine, microscopic: NEGATIVE
WBC, Ur, HPF, POC: NEGATIVE
YEAST UA: NEGATIVE

## 2013-11-05 NOTE — Progress Notes (Signed)
This encounter was created in error - please disregard.

## 2013-11-05 NOTE — Progress Notes (Signed)
Medication management  HPI - patient is here today with husband to fill pill boxes.  They have appt to have labs / urine rechecked per Dr Laurance Flatten as well.  Mrs. Sandstrom has been c/o decreased appetite for about 1 month.  PCP restarted pantoprazole 40mg  daily hoping this would help and donepzil / Aricept dose was decreased form 10mg  to 5mg  daily. Patient and husband also c/o price of Latuda and would like to change to cheaper alternative.  Patient states she doesn't think the [place] is helping any way.  Although patient c/o decreased appetite but weight is not decreasing  Assessment: Decreased appetite Medication Management - high cost medication  Plan: I have contacted Lajuana Ripple who prescribed Latuda and left message regarding patient's wish for something cheaper.   Hold aricept / donepzil to see if decreased appetite improves Recheck urine per Dr Laurance Flatten. Pill box filled for 2 weeks.   Cherre Robins, PharmD, CPP

## 2013-11-08 DIAGNOSIS — F4323 Adjustment disorder with mixed anxiety and depressed mood: Secondary | ICD-10-CM | POA: Diagnosis not present

## 2013-11-19 ENCOUNTER — Ambulatory Visit (INDEPENDENT_AMBULATORY_CARE_PROVIDER_SITE_OTHER): Payer: Medicare Other | Admitting: Pharmacist

## 2013-11-19 DIAGNOSIS — F0390 Unspecified dementia without behavioral disturbance: Secondary | ICD-10-CM

## 2013-11-19 NOTE — Progress Notes (Signed)
Filled pill boxes - patient's husband brought in pill boxes and I filled for 2 weeks.

## 2013-11-20 DIAGNOSIS — F4323 Adjustment disorder with mixed anxiety and depressed mood: Secondary | ICD-10-CM | POA: Diagnosis not present

## 2013-12-05 ENCOUNTER — Other Ambulatory Visit: Payer: Self-pay | Admitting: Pharmacist

## 2013-12-05 ENCOUNTER — Ambulatory Visit: Payer: Self-pay

## 2013-12-05 DIAGNOSIS — F329 Major depressive disorder, single episode, unspecified: Secondary | ICD-10-CM

## 2013-12-05 DIAGNOSIS — F32A Depression, unspecified: Secondary | ICD-10-CM

## 2013-12-05 MED ORDER — LAMOTRIGINE 25 MG PO TABS: 50.0000 mg | ORAL_TABLET | Freq: Two times a day (BID) | ORAL | Status: DC

## 2013-12-17 ENCOUNTER — Ambulatory Visit: Payer: Self-pay

## 2013-12-19 DIAGNOSIS — F4323 Adjustment disorder with mixed anxiety and depressed mood: Secondary | ICD-10-CM | POA: Diagnosis not present

## 2013-12-26 ENCOUNTER — Ambulatory Visit: Payer: Medicare Other | Admitting: Family Medicine

## 2013-12-28 ENCOUNTER — Encounter: Payer: Self-pay | Admitting: Family Medicine

## 2013-12-28 ENCOUNTER — Ambulatory Visit (INDEPENDENT_AMBULATORY_CARE_PROVIDER_SITE_OTHER): Payer: Medicare Other | Admitting: Family Medicine

## 2013-12-28 VITALS — BP 142/72 | HR 79 | Temp 98.4°F | Ht 64.0 in | Wt 163.0 lb

## 2013-12-28 DIAGNOSIS — R413 Other amnesia: Secondary | ICD-10-CM

## 2013-12-28 DIAGNOSIS — I10 Essential (primary) hypertension: Secondary | ICD-10-CM | POA: Diagnosis not present

## 2013-12-28 DIAGNOSIS — E785 Hyperlipidemia, unspecified: Secondary | ICD-10-CM

## 2013-12-28 DIAGNOSIS — F329 Major depressive disorder, single episode, unspecified: Secondary | ICD-10-CM

## 2013-12-28 DIAGNOSIS — F32A Depression, unspecified: Secondary | ICD-10-CM

## 2013-12-28 DIAGNOSIS — F3289 Other specified depressive episodes: Secondary | ICD-10-CM

## 2013-12-28 NOTE — Progress Notes (Signed)
Subjective:    Patient ID: Traci Wheeler, female    DOB: 1927/12/01, 80 y.o.   MRN: [id number]  HPI Pt here for follow up and management of chronic medical problems. The patient continues to do her multiple joint pains and depression. She still has confusion at times. She comes to the visit today with her husband . She seemed to be better today she was not as depressed and tearful. She plans to see her psychologist and therapist within the next couple of weeks. I reminded them to remind her power of attorney that I needed to speak with him regarding anything about her health and I would be happy to do that and he can contact me anytime. On reviewing her recent lab work her creatinine was elevated all other labs were good. She is due to get a chest x-ray and we will repeat her BMP today and we will see her again in 6-8 weeks.       Patient Active Problem List   Diagnosis Date Noted  . Malaise and fatigue 10/01/2013  . Hypertension 12/14/2012  . Hyperlipemia 12/14/2012  . Dementia 12/11/2012  . Depression 10/04/2012  . ALLERGIC RHINITIS 07/19/2007  . ASTHMA 07/19/2007  . ESOPHAGEAL REFLUX 07/19/2007   Outpatient Encounter Prescriptions as of 12/28/2013  Medication Sig  . albuterol (PROVENTIL HFA;VENTOLIN HFA) 108 (90 BASE) MCG/ACT inhaler Inhale 2 puffs into the lungs every 6 (six) hours as needed for wheezing.  Marland Kitchen amLODipine (NORVASC) 10 MG tablet Take 10 mg by mouth daily.    Marland Kitchen buPROPion (WELLBUTRIN XL) 300 MG 24 hr tablet Take 300 mg by mouth every morning.  . busPIRone (BUSPAR) 15 MG tablet Take 15 mg by mouth daily.  . cholecalciferol (VITAMIN D) 1000 UNITS tablet Take 2,000 Units by mouth daily.  . DULoxetine (CYMBALTA) 60 MG capsule Take 1 capsule (60 mg total) by mouth daily.  Marland Kitchen ezetimibe-simvastatin (VYTORIN) 10-40 MG per tablet 1/2 tablet daily in pm  . lamoTRIgine (LAMICTAL) 25 MG tablet Take 2 tablets (50 mg total) by mouth 2 (two) times daily.  Marland Kitchen lurasidone (LATUDA) 40  MG TABS Take 20 mg by mouth at bedtime.   . Memantine HCl ER (NAMENDA XR) 28 MG CP24 Take 28 mg by mouth every morning.  . mirtazapine (REMERON) 30 MG tablet Take 30 mg by mouth at bedtime.  . pantoprazole (PROTONIX) 40 MG tablet Take 1 tablet (40 mg total) by mouth daily.    Review of Systems  Constitutional: Negative.   HENT: Negative.   Eyes: Negative.   Respiratory: Negative.   Cardiovascular: Negative.   Gastrointestinal: Negative.   Endocrine: Negative.   Genitourinary: Negative.   Musculoskeletal: Positive for arthralgias.  Skin: Negative.   Allergic/Immunologic: Negative.   Neurological: Negative.   Hematological: Negative.   Psychiatric/Behavioral: Positive for confusion (at times).       Objective:   Physical Exam  Nursing note and vitals reviewed. Constitutional: She is oriented to person, place, and time. She appears well-developed and well-nourished. No distress.  HENT:  Head: Normocephalic and atraumatic.  Right Ear: External ear normal.  Left Ear: External ear normal.  Nose: Nose normal.  Mouth/Throat: Oropharynx is clear and moist. No oropharyngeal exudate.  Eyes: Conjunctivae and EOM are normal. Pupils are equal, round, and reactive to light. Right eye exhibits no discharge. Left eye exhibits no discharge. No scleral icterus.  Neck: Normal range of motion. Neck supple. No thyromegaly present.  Cardiovascular: Normal rate, regular rhythm, normal heart sounds  and intact distal pulses.   No murmur heard. At 72 per minute  Pulmonary/Chest: Effort normal and breath sounds normal. No respiratory distress. She has no wheezes. She has no rales. She exhibits no tenderness.  No wheezes or chest congestion and good breath sounds bilaterally  Abdominal: Soft. Bowel sounds are normal. She exhibits no mass. There is no tenderness. There is no rebound and no guarding.  Umbilical hernia still present  Musculoskeletal: Normal range of motion. She exhibits no edema and no  tenderness.  Lymphadenopathy:    She has no cervical adenopathy.  Neurological: She is alert and oriented to person, place, and time. She has normal reflexes.  Skin: Skin is warm and dry. No rash noted.  Psychiatric: She has a normal mood and affect. Her behavior is normal. Judgment and thought content normal.  The patient has expressive problems but seemed to be more in touch with [place] today because of her emotional condition was more stable. Her memory impairment is still obvious.   BP 142/72  Pulse 79  Temp(Src) 98.4 F (36.9 C) (Oral)  Ht 5' 4"  (1.626 m)  Wt 163 lb (73.936 kg)  BMI 27.97 kg/m2        Assessment & Plan:  1. Hyperlipemia - BMP8+EGFR  2. Essential hypertension - BMP8+EGFR  3. Memory disorder  4. Depression  5. Creatinine elevation  Patient Instructions                       Medicare Annual Wellness Visit   and the medical providers at [facility] strive to bring you the best medical care.  In doing so we not only want to address your current medical conditions and concerns but also to detect new conditions early and prevent illness, disease and health-related problems.    Medicare offers a yearly Wellness Visit which allows our clinical staff to assess your need for preventative services including immunizations, lifestyle education, counseling to decrease risk of preventable diseases and screening for fall risk and other medical concerns.    This visit is provided free of charge (no copay) for all Medicare recipients. The clinical pharmacists at [facility] have begun to conduct these Wellness Visits which will also include a thorough review of all your medications.    As you primary medical Traci Wheeler recommend that you make an appointment for your Annual Wellness Visit if you have not done so already this year.  You may set up this appointment before you leave today or you may call back ([phone]) and  schedule an appointment.  Please make sure when you call that you mention that you are scheduling your Annual Wellness Visit with the clinical pharmacist so that the appointment may be made for the proper length of time.     Continue current medications. Continue good therapeutic lifestyle changes which include good diet and exercise. Fall precautions discussed with patient. If an FOBT was given today- please return it to our front desk. If you are over 22 years old - you may need Prevnar 28 or the adult Pneumonia vaccine.     Arrie Senate MD

## 2013-12-28 NOTE — Patient Instructions (Signed)
Medicare Annual Wellness Visit  [facility] and the medical providers at [facility] strive to bring you the best medical care.  In doing so we not only want to address your current medical conditions and concerns but also to detect new conditions early and prevent illness, disease and health-related problems.    Medicare offers a yearly Wellness Visit which allows our clinical staff to assess your need for preventative services including immunizations, lifestyle education, counseling to decrease risk of preventable diseases and screening for fall risk and other medical concerns.    This visit is provided free of charge (no copay) for all Medicare recipients. The clinical pharmacists at [facility] have begun to conduct these Wellness Visits which will also include a thorough review of all your medications.    As you primary medical provider recommend that you make an appointment for your Annual Wellness Visit if you have not done so already this year.  You may set up this appointment before you leave today or you may call back ([phone]) and schedule an appointment.  Please make sure when you call that you mention that you are scheduling your Annual Wellness Visit with the clinical pharmacist so that the appointment may be made for the proper length of time.      Continue current medications. Continue good therapeutic lifestyle changes which include good diet and exercise. Fall precautions discussed with patient. If an FOBT was given today- please return it to our front desk. If you are over 50 years old - you may need Prevnar 13 or the adult Pneumonia vaccine.   

## 2013-12-29 LAB — BMP8+EGFR
BUN/Creatinine Ratio: 12 (ref 11–26)
BUN: 15 mg/dL (ref 8–27)
CALCIUM: 9.7 mg/dL (ref 8.7–10.3)
CHLORIDE: 101 mmol/L (ref 97–108)
CO2: 28 mmol/L (ref 18–29)
Creatinine, Ser: 1.26 mg/dL — ABNORMAL HIGH (ref 0.57–1.00)
GFR calc non Af Amer: 39 mL/min/{1.73_m2} — ABNORMAL LOW (ref >59)
GFR, EST AFRICAN AMERICAN: 46 mL/min/{1.73_m2} — AB (ref >59)
GLUCOSE: 90 mg/dL (ref 65–99)
Potassium: 4.2 mmol/L (ref 3.5–5.2)
Sodium: 144 mmol/L (ref 134–144)

## 2013-12-31 DIAGNOSIS — F4323 Adjustment disorder with mixed anxiety and depressed mood: Secondary | ICD-10-CM | POA: Diagnosis not present

## 2014-01-15 DIAGNOSIS — F4323 Adjustment disorder with mixed anxiety and depressed mood: Secondary | ICD-10-CM | POA: Diagnosis not present

## 2014-01-19 ENCOUNTER — Other Ambulatory Visit: Payer: Self-pay | Admitting: Family Medicine

## 2014-01-24 ENCOUNTER — Telehealth: Payer: Self-pay | Admitting: Family Medicine

## 2014-01-24 ENCOUNTER — Other Ambulatory Visit: Payer: Self-pay | Admitting: Family Medicine

## 2014-01-24 NOTE — Telephone Encounter (Signed)
appt scheduled

## 2014-01-25 ENCOUNTER — Ambulatory Visit (INDEPENDENT_AMBULATORY_CARE_PROVIDER_SITE_OTHER): Payer: Medicare Other | Admitting: Nurse Practitioner

## 2014-01-25 ENCOUNTER — Encounter: Payer: Self-pay | Admitting: Nurse Practitioner

## 2014-01-25 VITALS — BP 158/80 | HR 78 | Temp 98.7°F | Ht 64.0 in | Wt 165.0 lb

## 2014-01-25 DIAGNOSIS — R195 Other fecal abnormalities: Secondary | ICD-10-CM | POA: Diagnosis not present

## 2014-01-25 LAB — POCT HEMOGLOBIN: Hemoglobin: 11.9 g/dL — AB (ref 12.2–16.2)

## 2014-01-25 NOTE — Progress Notes (Signed)
   Subjective:    Patient ID: Traci Wheeler, female    DOB: September 01, 1927, 80 y.o.   MRN: [id number]  HPI patient in today c/o dark colored stools- she has been eating a lot of raisins in the mornigs and wondered if that is causing her dark stools.    Review of Systems  Constitutional: Negative for fatigue.  HENT: Negative.   Respiratory: Negative.   Cardiovascular: Negative.   Genitourinary: Negative.   Musculoskeletal: Negative.   Neurological: Negative.   Psychiatric/Behavioral: Negative.        Objective:   Physical Exam  Constitutional: She is oriented to person, place, and time. She appears well-developed and well-nourished.  Cardiovascular: Normal rate, regular rhythm and normal heart sounds.   Pulmonary/Chest: Effort normal and breath sounds normal.  Abdominal: Soft. Bowel sounds are normal.  Genitourinary: Guaiac negative stool.  Neurological: She is alert and oriented to person, place, and time.  Skin: Skin is warm and dry.  Psychiatric: She has a normal mood and affect. Her behavior is normal. Judgment and thought content normal.   BP 158/80  Pulse 78  Temp(Src) 98.7 F (37.1 C) (Oral)  Ht 5\' 4"  (1.626 m)  Wt 165 lb (74.844 kg)  BMI 28.31 kg/m2   Results for orders placed in visit on 01/25/14  POCT HEMOGLOBIN      Result Value Ref Range   Hemoglobin 11.9 (*) 12.2 - 16.2 g/dL        Assessment & Plan:   1. Dark stools   OTC iron supplements hemocult negative Ok to continue to eat raisins if would like Keep follow up appointment with Dr. Mayra Neer, FNP

## 2014-02-07 ENCOUNTER — Telehealth: Payer: Self-pay | Admitting: Pharmacist

## 2014-02-07 NOTE — Telephone Encounter (Signed)
Patient does not think that Anette Guarneri is working.  It is also very expensive and patient is in Medicare coverage gap.   She is on other medications for depression and anxiety.   Filled pill boxes with remaining latuda (8 days) and then patient will hold Latuda.   - patient and husband are instructed to call office ASAP if there is any worsening of depression, thoughts of hurting self or others.  Follow up in 2 weeks.

## 2014-02-19 DIAGNOSIS — F4323 Adjustment disorder with mixed anxiety and depressed mood: Secondary | ICD-10-CM | POA: Diagnosis not present

## 2014-03-11 ENCOUNTER — Encounter: Payer: Self-pay | Admitting: Family Medicine

## 2014-03-11 ENCOUNTER — Ambulatory Visit (INDEPENDENT_AMBULATORY_CARE_PROVIDER_SITE_OTHER): Payer: Medicare Other | Admitting: Family Medicine

## 2014-03-11 ENCOUNTER — Ambulatory Visit (INDEPENDENT_AMBULATORY_CARE_PROVIDER_SITE_OTHER): Payer: Medicare Other

## 2014-03-11 VITALS — BP 144/77 | HR 84 | Temp 97.3°F | Ht 64.0 in | Wt 165.0 lb

## 2014-03-11 DIAGNOSIS — J45909 Unspecified asthma, uncomplicated: Secondary | ICD-10-CM

## 2014-03-11 DIAGNOSIS — E785 Hyperlipidemia, unspecified: Secondary | ICD-10-CM

## 2014-03-11 DIAGNOSIS — I10 Essential (primary) hypertension: Secondary | ICD-10-CM

## 2014-03-11 DIAGNOSIS — F329 Major depressive disorder, single episode, unspecified: Secondary | ICD-10-CM | POA: Diagnosis not present

## 2014-03-11 DIAGNOSIS — E559 Vitamin D deficiency, unspecified: Secondary | ICD-10-CM

## 2014-03-11 DIAGNOSIS — R54 Age-related physical debility: Secondary | ICD-10-CM

## 2014-03-11 DIAGNOSIS — F3289 Other specified depressive episodes: Secondary | ICD-10-CM | POA: Diagnosis not present

## 2014-03-11 DIAGNOSIS — R4181 Age-related cognitive decline: Secondary | ICD-10-CM

## 2014-03-11 DIAGNOSIS — F32A Depression, unspecified: Secondary | ICD-10-CM

## 2014-03-11 LAB — POCT CBC
GRANULOCYTE PERCENT: 66.6 % (ref 37–80)
HCT, POC: 39 % (ref 37.7–47.9)
HEMOGLOBIN: 12.9 g/dL (ref 12.2–16.2)
LYMPH, POC: 2.1 (ref 0.6–3.4)
MCH: 30.1 pg (ref 27–31.2)
MCHC: 33 g/dL (ref 31.8–35.4)
MCV: 91.2 fL (ref 80–97)
MPV: 7 fL (ref 0–99.8)
PLATELET COUNT, POC: 396 10*3/uL (ref 142–424)
POC GRANULOCYTE: 5 (ref 2–6.9)
POC LYMPH %: 27.4 % (ref 10–50)
RBC: 4.3 M/uL (ref 4.04–5.48)
RDW, POC: 14.3 %
WBC: 7.5 10*3/uL (ref 4.6–10.2)

## 2014-03-11 NOTE — Progress Notes (Addendum)
Subjective:    Patient ID: Traci Wheeler, female    DOB: August 20, 1927, 80 y.o.   MRN: [id number]  HPI Pt here for follow up and management of chronic medical problems. The patient does complain of constipation dark stools and confusion. She comes to the visit today with her husband.        Patient Active Problem List   Diagnosis Date Noted  . Malaise and fatigue 10/01/2013  . Hypertension 12/14/2012  . Hyperlipemia 12/14/2012  . Dementia 12/11/2012  . Depression 10/04/2012  . ALLERGIC RHINITIS 07/19/2007  . ASTHMA 07/19/2007  . ESOPHAGEAL REFLUX 07/19/2007   Outpatient Encounter Prescriptions as of 03/11/2014  Medication Sig  . albuterol (PROVENTIL HFA;VENTOLIN HFA) 108 (90 BASE) MCG/ACT inhaler Inhale 2 puffs into the lungs every 6 (six) hours as needed for wheezing.  Marland Kitchen amLODipine (NORVASC) 10 MG tablet Take 10 mg by mouth daily.    Marland Kitchen buPROPion (WELLBUTRIN XL) 300 MG 24 hr tablet Take 300 mg by mouth every morning.  . busPIRone (BUSPAR) 15 MG tablet Take 15 mg by mouth daily.  . cholecalciferol (VITAMIN D) 1000 UNITS tablet Take 2,000 Units by mouth daily.  . DULoxetine (CYMBALTA) 60 MG capsule Take 1 capsule (60 mg total) by mouth daily.  Marland Kitchen ezetimibe-simvastatin (VYTORIN) 10-40 MG per tablet 1/2 tablet daily in pm  . lamoTRIgine (LAMICTAL) 25 MG tablet Take 2 tablets (50 mg total) by mouth 2 (two) times daily.  . Memantine HCl ER (NAMENDA XR) 28 MG CP24 Take 28 mg by mouth every morning.  . mirtazapine (REMERON) 30 MG tablet Take 30 mg by mouth at bedtime.  . pantoprazole (PROTONIX) 40 MG tablet Take 1 tablet (40 mg total) by mouth daily.    Review of Systems  Constitutional: Negative.   HENT: Negative.   Eyes: Negative.   Respiratory: Negative.   Cardiovascular: Negative.   Gastrointestinal: Positive for constipation (dark coloring - when does go).  Endocrine: Negative.   Genitourinary: Negative.   Musculoskeletal: Negative.   Skin: Negative.     Allergic/Immunologic: Negative.   Neurological: Negative.   Hematological: Negative.   Psychiatric/Behavioral: Positive for confusion.       Objective:   Physical Exam  Nursing note and vitals reviewed. Constitutional: She is oriented to person, place, and time. She appears well-developed and well-nourished. She appears distressed.  The patient is somewhat tearful but in fact calmer than usual  HENT:  Head: Normocephalic and atraumatic.  Right Ear: External ear normal.  Left Ear: External ear normal.  Nose: Nose normal.  Mouth/Throat: Oropharynx is clear and moist.  Eyes: Conjunctivae and EOM are normal. Pupils are equal, round, and reactive to light. Right eye exhibits no discharge. Left eye exhibits no discharge. No scleral icterus.  Neck: Normal range of motion. Neck supple. No thyromegaly present.  No carotid bruit  Cardiovascular: Normal rate, regular rhythm, normal heart sounds and intact distal pulses.  Exam reveals no gallop and no friction rub.   No murmur heard. At 72 per minute  Pulmonary/Chest: Effort normal and breath sounds normal. No respiratory distress. She has no wheezes. She has no rales. She exhibits no tenderness.  Abdominal: Soft. Bowel sounds are normal. She exhibits no mass. There is no tenderness. There is no rebound and no guarding.  Patient has umbilical hernia without tenderness or masses. There are no abdominal bruits  Musculoskeletal: Normal range of motion. She exhibits no edema and no tenderness.  Lymphadenopathy:    She has no cervical adenopathy.  Neurological: She is alert and oriented to person, place, and time. She has normal reflexes. No cranial nerve deficit.  Patient has an obvious memory disturbance that does not appear any worse than usual. She in fact appears more calm than usual  Skin: Skin is warm and dry. No rash noted. No erythema. There is pallor.  Psychiatric: Her behavior is normal.  She has a depressed mood and affect   BP  144/77  Pulse 84  Temp(Src) 97.3 F (36.3 C) (Oral)  Ht _0  (1.626 m)  Wt 165 lb (74.844 kg)  BMI 28.31 kg/m2  There was a 45 minute conversation with the patient and her husband about them making decisions to leave their home and get where they can be taken better care of. She seems to understand the importance of making this decision but may not be willing to leave her home yet.      MMSE done today and pt score was 15 out of possible 30.         Assessment & Plan:  1. ASTHMA - POCT CBC - DG Chest 2 View; Future  2. Essential hypertension - POCT CBC - BMP8+EGFR - Hepatic function panel - DG Chest 2 View; Future  3. Hyperlipemia - POCT CBC - Lipid panel  4. Depression - POCT CBC  5. Vitamin D deficiency - Vit D  25 hydroxy (rtn osteoporosis monitoring)  6. Frailty   No orders of the defined types were placed in this encounter.   Patient Instructions                       Medicare Annual Wellness Visit  [facility] and the medical providers at [facility] strive to bring you the best medical care.  In doing so we not only want to address your current medical conditions and concerns but also to detect new conditions early and prevent illness, disease and health-related problems.    Medicare offers a yearly Wellness Visit which allows our clinical staff to assess your need for preventative services including immunizations, lifestyle education, counseling to decrease risk of preventable diseases and screening for fall risk and other medical concerns.    This visit is provided free of charge (no copay) for all Medicare recipients. The clinical pharmacists at [facility] have begun to conduct these Wellness Visits which will also include a thorough review of all your medications.    As you primary medical provider recommend that you make an appointment for your Annual Wellness Visit if you have not done so already this  year.  You may set up this appointment before you leave today or you may call back ([phone]) and schedule an appointment.  Please make sure when you call that you mention that you are scheduling your Annual Wellness Visit with the clinical pharmacist so that the appointment may be made for the proper length of time.     Continue current medications. Continue good therapeutic lifestyle changes which include good diet and exercise. Fall precautions discussed with patient. If an FOBT was given today- please return it to our front desk. If you are over 72 years old - you may need Prevnar 56 or the adult Pneumonia vaccine.  Flu Shots will be available at our office starting mid- September. Please call and schedule a FLU CLINIC APPOINTMENT.   Please talk with her nephew regarding your need for being better taken care of   Arrie Senate  MD   

## 2014-03-11 NOTE — Patient Instructions (Addendum)
Medicare Annual Wellness Visit  [facility] and the medical providers at [facility] strive to bring you the best medical care.  In doing so we not only want to address your current medical conditions and concerns but also to detect new conditions early and prevent illness, disease and health-related problems.    Medicare offers a yearly Wellness Visit which allows our clinical staff to assess your need for preventative services including immunizations, lifestyle education, counseling to decrease risk of preventable diseases and screening for fall risk and other medical concerns.    This visit is provided free of charge (no copay) for all Medicare recipients. The clinical pharmacists at [facility] have begun to conduct these Wellness Visits which will also include a thorough review of all your medications.    As you primary medical provider recommend that you make an appointment for your Annual Wellness Visit if you have not done so already this year.  You may set up this appointment before you leave today or you may call back ([phone]) and schedule an appointment.  Please make sure when you call that you mention that you are scheduling your Annual Wellness Visit with the clinical pharmacist so that the appointment may be made for the proper length of time.     Continue current medications. Continue good therapeutic lifestyle changes which include good diet and exercise. Fall precautions discussed with patient. If an FOBT was given today- please return it to our front desk. If you are over 53 years old - you may need Prevnar 46 or the adult Pneumonia vaccine.  Flu Shots will be available at our office starting mid- September. Please call and schedule a FLU CLINIC APPOINTMENT.   Please talk with her nephew regarding your need for being better taken care of

## 2014-03-12 ENCOUNTER — Telehealth: Payer: Self-pay

## 2014-03-12 LAB — BMP8+EGFR
BUN/Creatinine Ratio: 11 (ref 11–26)
BUN: 14 mg/dL (ref 8–27)
CALCIUM: 10.4 mg/dL — AB (ref 8.7–10.3)
CO2: 25 mmol/L (ref 18–29)
CREATININE: 1.25 mg/dL — AB (ref 0.57–1.00)
Chloride: 99 mmol/L (ref 97–108)
GFR calc Af Amer: 46 mL/min/{1.73_m2} — ABNORMAL LOW (ref >59)
GFR, EST NON AFRICAN AMERICAN: 40 mL/min/{1.73_m2} — AB (ref >59)
GLUCOSE: 92 mg/dL (ref 65–99)
Potassium: 4.5 mmol/L (ref 3.5–5.2)
SODIUM: 143 mmol/L (ref 134–144)

## 2014-03-12 LAB — LIPID PANEL
Chol/HDL Ratio: 2.3 ratio units (ref 0.0–4.4)
Cholesterol, Total: 144 mg/dL (ref 100–199)
HDL: 62 mg/dL (ref >39)
LDL CALC: 44 mg/dL (ref 0–99)
Triglycerides: 191 mg/dL — ABNORMAL HIGH (ref 0–149)
VLDL CHOLESTEROL CAL: 38 mg/dL (ref 5–40)

## 2014-03-12 LAB — VITAMIN D 25 HYDROXY (VIT D DEFICIENCY, FRACTURES): VIT D 25 HYDROXY: 22.4 ng/mL — AB (ref 30.0–100.0)

## 2014-03-12 LAB — HEPATIC FUNCTION PANEL
ALT: 6 IU/L (ref 0–32)
AST: 15 IU/L (ref 0–40)
Albumin: 4.7 g/dL (ref 3.5–4.7)
Alkaline Phosphatase: 96 IU/L (ref 39–117)
BILIRUBIN DIRECT: 0.1 mg/dL (ref 0.00–0.40)
BILIRUBIN TOTAL: 0.3 mg/dL (ref 0.0–1.2)
TOTAL PROTEIN: 6.8 g/dL (ref 6.0–8.5)

## 2014-03-12 NOTE — Telephone Encounter (Signed)
Message copied by Koren Bound on Tue Mar 12, 2014 10:26 AM ------      Message from: Chipper Herb      Created: Mon Mar 11, 2014  2:33 PM       As per radiology report ------

## 2014-03-12 NOTE — Telephone Encounter (Signed)
Traci Wheeler aware of CXR results

## 2014-03-21 ENCOUNTER — Other Ambulatory Visit: Payer: Self-pay | Admitting: Pharmacist

## 2014-03-21 DIAGNOSIS — F329 Major depressive disorder, single episode, unspecified: Secondary | ICD-10-CM

## 2014-03-21 DIAGNOSIS — F32A Depression, unspecified: Secondary | ICD-10-CM

## 2014-03-21 NOTE — Progress Notes (Unsigned)
Patient's husband states she continues to have poor appetite - will hold namanda XR for next 2 weeks and see if improves. I discussed benefits of assisted living.

## 2014-03-21 NOTE — Progress Notes (Signed)
Plan to taper off lamotrigine over the next 2-4 weeks.

## 2014-03-26 DIAGNOSIS — F4323 Adjustment disorder with mixed anxiety and depressed mood: Secondary | ICD-10-CM | POA: Diagnosis not present

## 2014-04-03 DIAGNOSIS — F4323 Adjustment disorder with mixed anxiety and depressed mood: Secondary | ICD-10-CM | POA: Diagnosis not present

## 2014-04-08 ENCOUNTER — Ambulatory Visit (INDEPENDENT_AMBULATORY_CARE_PROVIDER_SITE_OTHER): Payer: Medicare Other | Admitting: Family

## 2014-04-08 ENCOUNTER — Encounter: Payer: Self-pay | Admitting: Family

## 2014-04-08 ENCOUNTER — Ambulatory Visit (INDEPENDENT_AMBULATORY_CARE_PROVIDER_SITE_OTHER): Payer: Medicare Other

## 2014-04-08 VITALS — BP 159/89 | HR 79 | Temp 97.1°F | Ht 64.0 in | Wt 156.4 lb

## 2014-04-08 DIAGNOSIS — R109 Unspecified abdominal pain: Secondary | ICD-10-CM

## 2014-04-08 DIAGNOSIS — K5900 Constipation, unspecified: Secondary | ICD-10-CM | POA: Diagnosis not present

## 2014-04-08 LAB — POCT CBC
GRANULOCYTE PERCENT: 59.8 % (ref 37–80)
HCT, POC: 40.6 % (ref 37.7–47.9)
Hemoglobin: 13.4 g/dL (ref 12.2–16.2)
LYMPH, POC: 2.1 (ref 0.6–3.4)
MCH, POC: 29.8 pg (ref 27–31.2)
MCHC: 33.1 g/dL (ref 31.8–35.4)
MCV: 90.2 fL (ref 80–97)
MPV: 6.8 fL (ref 0–99.8)
PLATELET COUNT, POC: 390 10*3/uL (ref 142–424)
POC Granulocyte: 3.9 (ref 2–6.9)
POC LYMPH %: 31.8 % (ref 10–50)
RBC: 4.5 M/uL (ref 4.04–5.48)
RDW, POC: 14.3 %
WBC: 6.6 10*3/uL (ref 4.6–10.2)

## 2014-04-08 LAB — POCT URINALYSIS DIPSTICK
BILIRUBIN UA: NEGATIVE
Blood, UA: NEGATIVE
Glucose, UA: NEGATIVE
LEUKOCYTES UA: NEGATIVE
NITRITE UA: NEGATIVE
Spec Grav, UA: >=1.03
Urobilinogen, UA: NEGATIVE
pH, UA: 5

## 2014-04-08 LAB — POCT UA - MICROSCOPIC ONLY
Bacteria, U Microscopic: NEGATIVE
Casts, Ur, LPF, POC: NEGATIVE
Crystals, Ur, HPF, POC: NEGATIVE
Mucus, UA: NEGATIVE
RBC, URINE, MICROSCOPIC: NEGATIVE
YEAST UA: NEGATIVE

## 2014-04-08 NOTE — Patient Instructions (Signed)

## 2014-04-08 NOTE — Progress Notes (Signed)
Subjective:    Patient ID: Traci Wheeler, female    DOB: Dec 19, 1927, 80 y.o.   MRN: [id number]  Abdominal Pain This is a new problem. The current episode started in the past 7 days. The onset quality is undetermined. The problem occurs intermittently. The pain is located in the generalized abdominal region. The pain is mild. Pertinent negatives include no belching, dysuria, headaches, nausea or vomiting. She has tried nothing for the symptoms.   Pt presents to the office for loss for appetite that started over the last week. Pt is having trouble remembering and giving details. Pt states she "fixes something to eat, but then I don't want it".     Review of Systems  Constitutional: Negative.   HENT: Negative.   Eyes: Negative.   Respiratory: Negative.  Negative for shortness of breath.   Cardiovascular: Negative.  Negative for palpitations.  Gastrointestinal: Positive for abdominal pain. Negative for nausea and vomiting.  Endocrine: Negative.   Genitourinary: Negative.  Negative for dysuria and flank pain.  Musculoskeletal: Negative.   Neurological: Negative.  Negative for headaches.  Hematological: Negative.   Psychiatric/Behavioral: Negative.   All other systems reviewed and are negative.      Objective:   Physical Exam  Vitals reviewed. Constitutional: She is oriented to person, place, and time. She appears well-developed and well-nourished. No distress.  Eyes: Pupils are equal, round, and reactive to light.  Neck: Normal range of motion. Neck supple. No thyromegaly present.  Cardiovascular: Normal rate, regular rhythm, normal heart sounds and intact distal pulses.   No murmur heard. Pulmonary/Chest: Effort normal and breath sounds normal. No respiratory distress. She has no wheezes.  Abdominal: Soft. Bowel sounds are normal. She exhibits no distension. There is tenderness (Mild tenderness in left middle abd area).  Musculoskeletal: Normal range of motion. She exhibits no  edema and no tenderness.  Neg for CVA tenderness   Neurological: She is alert and oriented to person, place, and time. She has normal reflexes. No cranial nerve deficit.  Skin: Skin is warm and dry.  Psychiatric: She has a normal mood and affect. Her behavior is normal. Judgment and thought content normal.   Results for orders placed in visit on 04/08/14  POCT CBC      Result Value Ref Range   WBC 6.6  4.6 - 10.2 K/uL   Lymph, poc 2.1  0.6 - 3.4   POC LYMPH PERCENT 31.8  10 - 50 %L   POC Granulocyte 3.9  2 - 6.9   Granulocyte percent 59.8  37 - 80 %G   RBC 4.5  4.04 - 5.48 M/uL   Hemoglobin 13.4  12.2 - 16.2 g/dL   HCT, POC 40.6  37.7 - 47.9 %   MCV 90.2  80 - 97 fL   MCH, POC 29.8  27 - 31.2 pg   MCHC 33.1  31.8 - 35.4 g/dL   RDW, POC 14.3     Platelet Count, POC 390.0  142 - 424 K/uL   MPV 6.8  0 - 99.8 fL  POCT UA - MICROSCOPIC ONLY      Result Value Ref Range   WBC, Ur, HPF, POC 5-7     RBC, urine, microscopic NEG     Bacteria, U Microscopic NEG     Mucus, UA NEG     Epithelial cells, urine per micros OCC     Crystals, Ur, HPF, POC NEG     Casts, Ur, LPF, POC NEG  Yeast, UA NEG    POCT URINALYSIS DIPSTICK      Result Value Ref Range   Color, UA AMBER     Clarity, UA CLEAR     Glucose, UA NEG     Bilirubin, UA NEG     Ketones, UA SMALL     Spec Grav, UA >=1.030     Blood, UA NEG     pH, UA 5.0     Protein, UA 30+     Urobilinogen, UA negative     Nitrite, UA NEG     Leukocytes, UA Negative      KUB done- See report  BP 159/89  Pulse 79  Temp(Src) 97.1 F (36.2 C) (Oral)  Ht 5\' 4"  (1.626 m)  Wt 156 lb 6.4 oz (70.943 kg)  BMI 26.83 kg/m2      Assessment & Plan:  1. Abdominal pain, left lateral - POCT CBC - POCT UA - Microscopic Only - POCT urinalysis dipstick - DG Abd 1 View; Future  2. Constipation, unspecified constipation type - Force fluids -Miralax and prune juice daily until pt is cleared out -Pt will need to be on daily stool softener   -RTO if pain worsens   Evelina Dun, FNP

## 2014-04-09 ENCOUNTER — Ambulatory Visit: Payer: Medicare Other | Admitting: Family Medicine

## 2014-04-19 ENCOUNTER — Other Ambulatory Visit: Payer: Self-pay | Admitting: Pharmacist

## 2014-04-19 MED ORDER — MEMANTINE HCL-DONEPEZIL HCL ER 28-10 MG PO CP24: 1.0000 | ORAL_CAPSULE | Freq: Every evening | ORAL | Status: DC

## 2014-04-19 NOTE — Progress Notes (Signed)
Filled patient's pill boxes for 2 weeks today in office.  We had held her namenda and aricept / donepzil for the last month due to c/o decreased appetite.  Patient's husband reports no changes in appetite but that he believes that her memory has worsened slightly.   Restarted namenda / donepzil combo - Namzaric 28/10mg  1 capsules qpm.  Cherre Robins, PharmD, CPP

## 2014-04-24 ENCOUNTER — Ambulatory Visit: Payer: Medicare Other

## 2014-04-24 ENCOUNTER — Emergency Department (HOSPITAL_COMMUNITY)
Admission: EM | Admit: 2014-04-24 | Discharge: 2014-04-24 | Disposition: A | Discharge status: Home / Self Care | Payer: Medicare Other | Attending: Emergency Medicine | Admitting: Emergency Medicine

## 2014-04-24 ENCOUNTER — Encounter (HOSPITAL_COMMUNITY): Payer: Self-pay | Admitting: Emergency Medicine

## 2014-04-24 ENCOUNTER — Ambulatory Visit (INDEPENDENT_AMBULATORY_CARE_PROVIDER_SITE_OTHER): Payer: Medicare Other | Admitting: Family Medicine

## 2014-04-24 VITALS — BP 129/78 | HR 78 | Temp 97.6°F | Ht 64.0 in | Wt 148.0 lb

## 2014-04-24 DIAGNOSIS — Z79899 Other long term (current) drug therapy: Secondary | ICD-10-CM | POA: Insufficient documentation

## 2014-04-24 DIAGNOSIS — I10 Essential (primary) hypertension: Secondary | ICD-10-CM | POA: Insufficient documentation

## 2014-04-24 DIAGNOSIS — R109 Unspecified abdominal pain: Secondary | ICD-10-CM | POA: Diagnosis not present

## 2014-04-24 DIAGNOSIS — R634 Abnormal weight loss: Secondary | ICD-10-CM | POA: Insufficient documentation

## 2014-04-24 DIAGNOSIS — E785 Hyperlipidemia, unspecified: Secondary | ICD-10-CM | POA: Insufficient documentation

## 2014-04-24 DIAGNOSIS — R110 Nausea: Secondary | ICD-10-CM

## 2014-04-24 DIAGNOSIS — K219 Gastro-esophageal reflux disease without esophagitis: Secondary | ICD-10-CM | POA: Insufficient documentation

## 2014-04-24 DIAGNOSIS — J45909 Unspecified asthma, uncomplicated: Secondary | ICD-10-CM | POA: Insufficient documentation

## 2014-04-24 DIAGNOSIS — R630 Anorexia: Secondary | ICD-10-CM | POA: Diagnosis present

## 2014-04-24 DIAGNOSIS — Z79890 Hormone replacement therapy (postmenopausal): Secondary | ICD-10-CM | POA: Insufficient documentation

## 2014-04-24 DIAGNOSIS — R5383 Other fatigue: Secondary | ICD-10-CM

## 2014-04-24 DIAGNOSIS — Z86010 Personal history of colonic polyps: Secondary | ICD-10-CM | POA: Diagnosis not present

## 2014-04-24 DIAGNOSIS — R5381 Other malaise: Secondary | ICD-10-CM | POA: Insufficient documentation

## 2014-04-24 LAB — POCT CBC
Granulocyte percent: 60.4 %G (ref 37–80)
HCT, POC: 40.9 % (ref 37.7–47.9)
Hemoglobin: 13.8 g/dL (ref 12.2–16.2)
Lymph, poc: 1.7 (ref 0.6–3.4)
MCH, POC: 30 pg (ref 27–31.2)
MCHC: 33.7 g/dL (ref 31.8–35.4)
MCV: 89.2 fL (ref 80–97)
MPV: 7.6 fL (ref 0–99.8)
POC Granulocyte: 3.3 (ref 2–6.9)
POC LYMPH PERCENT: 30.2 %L (ref 10–50)
Platelet Count, POC: 405 10*3/uL (ref 142–424)
RBC: 4.6 M/uL (ref 4.04–5.48)
RDW, POC: 14 %
WBC: 5.5 10*3/uL (ref 4.6–10.2)

## 2014-04-24 LAB — CBC WITH DIFFERENTIAL/PLATELET
Basophils Absolute: 0.1 10*3/uL (ref 0.0–0.1)
Basophils Relative: 1 % (ref 0–1)
EOS ABS: 0.1 10*3/uL (ref 0.0–0.7)
Eosinophils Relative: 1 % (ref 0–5)
HEMATOCRIT: 44.9 % (ref 36.0–46.0)
Hemoglobin: 14.9 g/dL (ref 12.0–15.0)
LYMPHS ABS: 2.2 10*3/uL (ref 0.7–4.0)
LYMPHS PCT: 28 % (ref 12–46)
MCH: 30.9 pg (ref 26.0–34.0)
MCHC: 33.2 g/dL (ref 30.0–36.0)
MCV: 93.2 fL (ref 78.0–100.0)
MONO ABS: 0.7 10*3/uL (ref 0.1–1.0)
Monocytes Relative: 9 % (ref 3–12)
Neutro Abs: 4.7 10*3/uL (ref 1.7–7.7)
Neutrophils Relative %: 61 % (ref 43–77)
Platelets: 451 10*3/uL — ABNORMAL HIGH (ref 150–400)
RBC: 4.82 MIL/uL (ref 3.87–5.11)
RDW: 14.1 % (ref 11.5–15.5)
WBC: 7.7 10*3/uL (ref 4.0–10.5)

## 2014-04-24 LAB — COMPREHENSIVE METABOLIC PANEL
ALK PHOS: 81 U/L (ref 39–117)
ALT: 8 U/L (ref 0–35)
ANION GAP: 17 — AB (ref 5–15)
AST: 15 U/L (ref 0–37)
Albumin: 4.3 g/dL (ref 3.5–5.2)
BILIRUBIN TOTAL: 0.3 mg/dL (ref 0.3–1.2)
BUN: 12 mg/dL (ref 6–23)
CHLORIDE: 98 meq/L (ref 96–112)
CO2: 26 meq/L (ref 19–32)
Calcium: 10.6 mg/dL — ABNORMAL HIGH (ref 8.4–10.5)
Creatinine, Ser: 1.27 mg/dL — ABNORMAL HIGH (ref 0.50–1.10)
GFR calc Af Amer: 44 mL/min — ABNORMAL LOW (ref >90)
GFR, EST NON AFRICAN AMERICAN: 38 mL/min — AB (ref >90)
GLUCOSE: 163 mg/dL — AB (ref 70–99)
POTASSIUM: 3.4 meq/L — AB (ref 3.7–5.3)
SODIUM: 141 meq/L (ref 137–147)
Total Protein: 7.7 g/dL (ref 6.0–8.3)

## 2014-04-24 LAB — GLUCOSE, POCT (MANUAL RESULT ENTRY): POC Glucose: 160 mg/dl — AB (ref 70–99)

## 2014-04-24 LAB — URINALYSIS, ROUTINE W REFLEX MICROSCOPIC
BILIRUBIN URINE: NEGATIVE
Glucose, UA: NEGATIVE mg/dL
HGB URINE DIPSTICK: NEGATIVE
KETONES UR: NEGATIVE mg/dL
Nitrite: NEGATIVE
PROTEIN: NEGATIVE mg/dL
SPECIFIC GRAVITY, URINE: 1.005 (ref 1.005–1.030)
Urobilinogen, UA: 0.2 mg/dL (ref 0.0–1.0)
pH: 6 (ref 5.0–8.0)

## 2014-04-24 LAB — URINE MICROSCOPIC-ADD ON

## 2014-04-24 LAB — LIPASE, BLOOD: Lipase: 62 U/L — ABNORMAL HIGH (ref 11–59)

## 2014-04-24 NOTE — ED Provider Notes (Signed)
CSN: [id number]     Arrival date & time 04/24/14  1435 History   First MD Initiated Contact with Patient 04/24/14 1640     Chief Complaint  Patient presents with  . Abdominal Pain  . Anorexia  . Weight Loss    HPI Patient presents to the emergency room with complaints of weight loss and possible dehydration. Patient went to her primary doctor's office today. While there her doctor noticed that she had lost approximately 15 pounds in the last couple of weeks. The patient had also been complaining of poor appetite. She has not wanted to eat or drink that much the last few days. The patient is not having any abdominal pain now but has had some intermittent discomfort that she describes as gas pains. She has had some trouble with constipation recently. She denies any fevers or chills. No vomiting. No diarrhea. No chest pain or shortness of breath.  When she was seen at her doctor's office today, her doctor was very concerned about the possibility of renal failure. He wanted her to come to the emergency room for evaluation Past Medical History  Diagnosis Date  . Hypertension   . Asthma   . Postmenopausal HRT (hormone replacement therapy)   . Depression   . Hyperlipidemia   . GERD (gastroesophageal reflux disease)   . Colon polyps   . RBBB (right bundle branch block)   . Gastric polyps   . Fibromyalgia   . Hiatal hernia    Past Surgical History  Procedure Laterality Date  . Lumbar spine surgery      Disc   Family History  Problem Relation Age of Onset  . Stroke Sister     2 minor strokes  . Kidney disease Brother    History  Substance Use Topics  . Smoking status: Never Smoker   . Smokeless tobacco: Not on file  . Alcohol Use: No   OB History   Grav Para Term Preterm Abortions TAB SAB Ect Mult Living                 Review of Systems  All other systems reviewed and are negative.     Allergies  Aspirin; Codeine phosphate; Darvocet; Glucosamine forte; Naproxen;  Nsaids; Sulfamethoxazole; and Sulfur dioxide  Home Medications   Prior to Admission medications   Medication Sig Start Date End Date Taking? Authorizing Provider  albuterol (PROVENTIL HFA;VENTOLIN HFA) 108 (90 BASE) MCG/ACT inhaler Inhale 2 puffs into the lungs every 6 (six) hours as needed for wheezing.    Historical Provider, MD  amLODipine (NORVASC) 10 MG tablet Take 10 mg by mouth daily.      Historical Provider, MD  buPROPion (WELLBUTRIN XL) 300 MG 24 hr tablet Take 300 mg by mouth every morning.    Historical Provider, MD  busPIRone (BUSPAR) 15 MG tablet Take 15 mg by mouth daily.    Historical Provider, MD  cholecalciferol (VITAMIN D) 1000 UNITS tablet Take 2,000 Units by mouth daily.    Historical Provider, MD  DULoxetine (CYMBALTA) 60 MG capsule Take 1 capsule (60 mg total) by mouth daily. 04/30/13   Chipper Herb, MD  ezetimibe-simvastatin (VYTORIN) 10-40 MG per tablet 1/2 tablet daily in pm 12/26/12   Tammy Eckard, PHARMD  lamoTRIgine (LAMICTAL) 25 MG tablet Take 1 tablet (25 mg total) by mouth 2 (two) times daily. 03/21/14   Tammy Eckard, PHARMD  Memantine HCl-Donepezil HCl (NAMZARIC) 28-10 MG CP24 Take 1 capsule by mouth every evening. 04/19/14  Tammy Eckard, PHARMD  mirtazapine (REMERON) 30 MG tablet Take 30 mg by mouth at bedtime.    Historical Provider, MD  pantoprazole (PROTONIX) 40 MG tablet Take 1 tablet (40 mg total) by mouth daily. 10/01/13   Chipper Herb, MD   BP 176/82  Pulse 89  Temp(Src) 98.1 F (36.7 C) (Oral)  Resp 18  SpO2 100% Physical Exam  Nursing note and vitals reviewed. Constitutional: No distress.  Patient is calm in no distress  HENT:  Head: Normocephalic and atraumatic.  Right Ear: External ear normal.  Left Ear: External ear normal.  Eyes: Conjunctivae are normal. Right eye exhibits no discharge. Left eye exhibits no discharge. No scleral icterus.  Neck: Neck supple. No tracheal deviation present.  Cardiovascular: Normal rate, regular rhythm and  intact distal pulses.   Pulmonary/Chest: Effort normal and breath sounds normal. No stridor. No respiratory distress. She has no wheezes. She has no rales.  Abdominal: Soft. Bowel sounds are normal. She exhibits no distension. There is no tenderness. There is no rebound and no guarding.  No abdominal tenderness, no guarding no pulsatile or non pulsatile mass,   Musculoskeletal: She exhibits no edema and no tenderness.  Neurological: She is alert. She has normal strength. No cranial nerve deficit (no facial droop, extraocular movements intact, no slurred speech) or sensory deficit. She exhibits normal muscle tone. She displays no seizure activity. Coordination normal.  Skin: Skin is warm and dry. No rash noted. She is not diaphoretic.  Psychiatric: She has a normal mood and affect.    ED Course  Procedures (including critical care time) Labs Review Labs Reviewed  COMPREHENSIVE METABOLIC PANEL - Abnormal; Notable for the following:    Potassium 3.4 (*)    Glucose, Bld 163 (*)    Creatinine, Ser 1.27 (*)    Calcium 10.6 (*)    GFR calc non Af Amer 38 (*)    GFR calc Af Amer 44 (*)    Anion gap 17 (*)    All other components within normal limits  URINALYSIS, ROUTINE W REFLEX MICROSCOPIC - Abnormal; Notable for the following:    Leukocytes, UA SMALL (*)    All other components within normal limits  LIPASE, BLOOD - Abnormal; Notable for the following:    Lipase 62 (*)    All other components within normal limits  CBC WITH DIFFERENTIAL - Abnormal; Notable for the following:    Platelets 451 (*)    All other components within normal limits  URINE MICROSCOPIC-ADD ON      MDM   Final diagnoses:  Malaise and fatigue    I discussed the initial laboratory tests with the family. Urinalysis is still pending. She had plain films as an outpatient a few weeks ago that showed constipation. I offered abdominal CT scanning with the patient and family. Patient is feeling fine. She does not want  to stay in the emergency department. THey would rather follow up with the primary doctor. CBC is normal.  Slight increase in lipase.  Calcium also mildly elevated but this has been on previous labs as well. Pt may benefit from CT scanning but this can be as an outpatient.  She does not want to stay in the ED and wants to go home.  Findings and plan discussed with family and patient.  At this time there does not appear to be any evidence of an acute emergency medical condition and the patient appears stable for discharge with appropriate outpatient follow up.  Dorie Rank, MD 04/24/14 [phone]

## 2014-04-24 NOTE — Discharge Instructions (Signed)

## 2014-04-24 NOTE — Progress Notes (Signed)
   Subjective:    Patient ID: Traci Wheeler, female    DOB: 10/04/1927, 80 y.o.   MRN: [id number]  HPI This 80 y.o. female presents for evaluation of Nausea and not eating or drinking.  She cannot walk and is brought in wheelchair.  Her husband is here with patient and states she is very sick and he cannot care for her.  They do not have family near by.   Review of Systems C/o nausea and fatigue   No chest pain, SOB, HA, dizziness, vision change, N/V, diarrhea, constipation, dysuria, urinary urgency or frequency, myalgias, arthralgias or rash.   Objective:   Physical Exam  Vital signs noted  Acutely ill appearing female.  HEENT - Head atraumatic Normocephalic                Eyes - PERRLA, Conjuctiva - clear Sclera- Clear EOMI                Ears - EAC's Wnl TM's Wnl Gross Hearing WNL                Throat - oropharanx dry Respiratory - Lungs CTA bilateral Cardiac - RRR S1 and S2 without murmur GI - Abdomen soft Nontender and bowel sounds active x 4 Extremities - No edema. Neuro - Grossly intact.  Results for orders placed in visit on 04/24/14  POCT CBC      Result Value Ref Range   WBC 5.5  4.6 - 10.2 K/uL   Lymph, poc 1.7  0.6 - 3.4   POC LYMPH PERCENT 30.2  10 - 50 %L   MID (cbc)    0 - 0.9   POC MID %    0 - 12 %M   POC Granulocyte 3.3  2 - 6.9   Granulocyte percent 60.4  37 - 80 %G   RBC 4.6  4.04 - 5.48 M/uL   Hemoglobin 13.8  12.2 - 16.2 g/dL   HCT, POC 40.9  37.7 - 47.9 %   MCV 89.2  80 - 97 fL   MCH, POC 30.0  27 - 31.2 pg   MCHC 33.7  31.8 - 35.4 g/dL   RDW, POC 14.0     Platelet Count, POC 405.0  142 - 424 K/uL   MPV 7.6  0 - 99.8 fL  GLUCOSE, POCT (MANUAL RESULT ENTRY)      Result Value Ref Range   POC Glucose 160 (*) 70 - 99 mg/dl      Assessment & Plan:  Nausea without vomiting - Plan: POCT CBC  Other fatigue - Plan: POCT glucose (manual entry)  Family arrive from [facility] and agree to take patient to the ED.  Discussed with patient that she    May have acute renal failure since not drinking fluids for 3 days and recommend she go to ED.  Lysbeth Penner FNP

## 2014-04-24 NOTE — ED Notes (Addendum)
Pt sent here from PCP for possible renal failure and weight loss with loss of appetite and nausea.  Pt was seen at PCP for abd pain and per family pt's blood counts ruled out UTI.   Pt c/o gas pains in her abd now. Pt states that she had an xray last week and was "full of stool". Pt doesn't know when her last BM was.

## 2014-04-25 ENCOUNTER — Telehealth: Payer: Self-pay | Admitting: Family Medicine

## 2014-04-25 DIAGNOSIS — R630 Anorexia: Secondary | ICD-10-CM

## 2014-04-25 DIAGNOSIS — R748 Abnormal levels of other serum enzymes: Secondary | ICD-10-CM

## 2014-04-25 DIAGNOSIS — R112 Nausea with vomiting, unspecified: Secondary | ICD-10-CM

## 2014-04-25 DIAGNOSIS — R109 Unspecified abdominal pain: Secondary | ICD-10-CM

## 2014-04-25 NOTE — Telephone Encounter (Signed)
Patient was sent to the ER yesterday because she hadn't eaten in 4 days and had some episodes of vomiting. Family is concerned about elevated lipase results from ER visit and would like to have CT ordered.   They would also like home health services. Will need a face to face visit for this. I don't see that one has been done.  Appt scheduled for face to face visit.   Would you like to order CT scan?

## 2014-04-25 NOTE — Telephone Encounter (Signed)
Dr. Laurance Flatten to review

## 2014-04-25 NOTE — Telephone Encounter (Signed)
Please schedule a CT scan of the abdomen per hospital discharge request Also arrange home health visits to monitor this patient's condition and weight and eating habits more closely

## 2014-04-26 NOTE — Telephone Encounter (Signed)
Order placed for CT abd/pelvis without contrast since creatinine was elevated. Face to face scheduled for next week.

## 2014-04-29 ENCOUNTER — Encounter: Payer: Self-pay | Admitting: Family Medicine

## 2014-04-29 ENCOUNTER — Ambulatory Visit (INDEPENDENT_AMBULATORY_CARE_PROVIDER_SITE_OTHER): Payer: Medicare Other | Admitting: Family Medicine

## 2014-04-29 VITALS — BP 139/83 | HR 90 | Temp 96.6°F | Ht 64.0 in | Wt 150.0 lb

## 2014-04-29 DIAGNOSIS — F0390 Unspecified dementia without behavioral disturbance: Secondary | ICD-10-CM | POA: Diagnosis not present

## 2014-04-29 DIAGNOSIS — R413 Other amnesia: Secondary | ICD-10-CM

## 2014-04-29 DIAGNOSIS — Z23 Encounter for immunization: Secondary | ICD-10-CM

## 2014-04-29 DIAGNOSIS — R5381 Other malaise: Secondary | ICD-10-CM | POA: Diagnosis not present

## 2014-04-29 DIAGNOSIS — R5383 Other fatigue: Secondary | ICD-10-CM

## 2014-04-29 DIAGNOSIS — K5900 Constipation, unspecified: Secondary | ICD-10-CM

## 2014-04-29 DIAGNOSIS — R54 Age-related physical debility: Secondary | ICD-10-CM

## 2014-04-29 DIAGNOSIS — R109 Unspecified abdominal pain: Secondary | ICD-10-CM

## 2014-04-29 NOTE — Progress Notes (Signed)
Subjective:    Patient ID: Traci Wheeler, female    DOB: 08-17-1927, 80 y.o.   MRN: [id number]  HPI Patient here today for face to face for home health needs. Ongoing care for this patient has been difficult. She and her husband are both resistant to allowing home health aides to come into the home and help them. They both also refused to go to an assisted living facility. They absolutely refuse to leave their home. The patient comes today with a relative, her great niece. The niece is definitely concerned about her aunts well fair. The visit is primarily to allow face-to-face documentation so that they can get home health care. We'll once again this has been refused by the husband in the past. This patient is kind and sweet and has dementia. She also has severe depression. She has chronic problems with abdominal pain. She recently made a visit to the emergency room and was discharged for this. Her main complaint today is fatigue and weakness.      Patient Active Problem List   Diagnosis Date Noted  . Malaise and fatigue 10/01/2013  . Hypertension 12/14/2012  . Hyperlipemia 12/14/2012  . Dementia 12/11/2012  . Depression 10/04/2012  . ALLERGIC RHINITIS 07/19/2007  . ASTHMA 07/19/2007  . ESOPHAGEAL REFLUX 07/19/2007   Outpatient Encounter Prescriptions as of 04/29/2014  Medication Sig  . albuterol (PROVENTIL HFA;VENTOLIN HFA) 108 (90 BASE) MCG/ACT inhaler Inhale 2 puffs into the lungs every 6 (six) hours as needed for wheezing.  Marland Kitchen amLODipine (NORVASC) 10 MG tablet Take 10 mg by mouth daily.    Marland Kitchen buPROPion (WELLBUTRIN XL) 300 MG 24 hr tablet Take 300 mg by mouth every morning.  . busPIRone (BUSPAR) 15 MG tablet Take 15 mg by mouth daily.  . cholecalciferol (VITAMIN D) 1000 UNITS tablet Take 2,000 Units by mouth daily.  . DULoxetine (CYMBALTA) 60 MG capsule Take 1 capsule (60 mg total) by mouth daily.  Marland Kitchen ezetimibe-simvastatin (VYTORIN) 10-40 MG per tablet 1/2 tablet daily in pm  .  lamoTRIgine (LAMICTAL) 25 MG tablet Take 1 tablet (25 mg total) by mouth 2 (two) times daily.  . Memantine HCl-Donepezil HCl (NAMZARIC) 28-10 MG CP24 Take 1 capsule by mouth every evening.  . mirtazapine (REMERON) 30 MG tablet Take 30 mg by mouth at bedtime.  . pantoprazole (PROTONIX) 40 MG tablet Take 1 tablet (40 mg total) by mouth daily.    Review of Systems  Constitutional: Positive for fatigue.  HENT: Negative.   Eyes: Negative.   Respiratory: Negative.   Cardiovascular: Negative.   Gastrointestinal: Negative.   Endocrine: Negative.   Genitourinary: Negative.   Musculoskeletal: Negative.   Skin: Negative.   Allergic/Immunologic: Negative.   Neurological: Positive for weakness.  Hematological: Negative.   Psychiatric/Behavioral: Negative.        Objective:   Physical Exam  Nursing note and vitals reviewed. Constitutional: She is oriented to person, place, and time. She appears distressed.  Elderly distressed and tearful but understanding of immediate conversation  HENT:  Head: Normocephalic and atraumatic.  Right Ear: External ear normal.  Left Ear: External ear normal.  Nose: Nose normal.  Mouth/Throat: Oropharynx is clear and moist. No oropharyngeal exudate.  Eyes: Conjunctivae and EOM are normal. Pupils are equal, round, and reactive to light. Right eye exhibits no discharge. Left eye exhibits no discharge. No scleral icterus.  Neck: Normal range of motion. Neck supple. No thyromegaly present.  No carotid bruit  Cardiovascular: Normal rate, regular rhythm and normal  heart sounds.   No murmur heard. Pulmonary/Chest: Effort normal and breath sounds normal. No respiratory distress. She has no wheezes. She has no rales. She exhibits no tenderness.  Abdominal: Soft. Bowel sounds are normal. She exhibits no mass. There is no tenderness. There is no rebound and no guarding.  No abdominal tenderness or mass palpated  Musculoskeletal: Normal range of motion. She exhibits no  edema and no tenderness.  Lymphadenopathy:    She has no cervical adenopathy.  Neurological: She is alert and oriented to person, place, and time. She has normal reflexes. No cranial nerve deficit.  The patient is alert but has poor recall memory  Skin: Skin is warm and dry. No rash noted.  Dry skin  Psychiatric: She has a normal mood and affect. Her behavior is normal. Judgment and thought content normal.   BP 139/83  Pulse 90  Temp(Src) 96.6 F (35.9 C) (Oral)  Ht 5\' 4"  (1.626 m)  Wt 150 lb (68.04 kg)  BMI 25.73 kg/m2        Assessment & Plan:  1. Dementia, without behavioral disturbance  2. Memory disorder  3. Malaise and fatigue  4. Constipation, unspecified constipation type  No orders of the defined types were placed in this encounter.   Patient Instructions  Continue current medications Follow-through with x-rays that are planned Schedule family consultation and discuss what is good for both you and your husband Continue to drink plenty of fluids and use stool softeners over-the-counter   Arrie Senate MD   5. Abdominal pain, unspecified abdominal location

## 2014-04-29 NOTE — Patient Instructions (Signed)
Continue current medications Follow-through with x-rays that are planned Schedule family consultation and discuss what is good for both you and your husband Continue to drink plenty of fluids and use stool softeners over-the-counter

## 2014-04-30 ENCOUNTER — Ambulatory Visit (HOSPITAL_COMMUNITY)
Admission: RE | Admit: 2014-04-30 | Discharge: 2014-04-30 | Disposition: A | Discharge status: Home / Self Care | Payer: Medicare Other | Source: Ambulatory Visit | Attending: Family Medicine | Admitting: Family Medicine

## 2014-04-30 DIAGNOSIS — R634 Abnormal weight loss: Secondary | ICD-10-CM | POA: Diagnosis not present

## 2014-04-30 DIAGNOSIS — R748 Abnormal levels of other serum enzymes: Secondary | ICD-10-CM | POA: Diagnosis not present

## 2014-04-30 DIAGNOSIS — N200 Calculus of kidney: Secondary | ICD-10-CM | POA: Diagnosis not present

## 2014-04-30 DIAGNOSIS — K429 Umbilical hernia without obstruction or gangrene: Secondary | ICD-10-CM | POA: Diagnosis not present

## 2014-04-30 DIAGNOSIS — R110 Nausea: Secondary | ICD-10-CM | POA: Insufficient documentation

## 2014-04-30 DIAGNOSIS — R109 Unspecified abdominal pain: Secondary | ICD-10-CM

## 2014-04-30 DIAGNOSIS — R112 Nausea with vomiting, unspecified: Secondary | ICD-10-CM

## 2014-04-30 DIAGNOSIS — R630 Anorexia: Secondary | ICD-10-CM

## 2014-05-02 ENCOUNTER — Other Ambulatory Visit: Payer: Self-pay | Admitting: Pharmacist

## 2014-05-02 DIAGNOSIS — K219 Gastro-esophageal reflux disease without esophagitis: Secondary | ICD-10-CM | POA: Diagnosis not present

## 2014-05-02 DIAGNOSIS — I10 Essential (primary) hypertension: Secondary | ICD-10-CM | POA: Diagnosis not present

## 2014-05-02 DIAGNOSIS — J45909 Unspecified asthma, uncomplicated: Secondary | ICD-10-CM | POA: Diagnosis not present

## 2014-05-02 DIAGNOSIS — F329 Major depressive disorder, single episode, unspecified: Secondary | ICD-10-CM | POA: Diagnosis not present

## 2014-05-02 DIAGNOSIS — F0390 Unspecified dementia without behavioral disturbance: Secondary | ICD-10-CM | POA: Diagnosis not present

## 2014-05-02 DIAGNOSIS — F419 Anxiety disorder, unspecified: Secondary | ICD-10-CM | POA: Diagnosis not present

## 2014-05-02 DIAGNOSIS — R262 Difficulty in walking, not elsewhere classified: Secondary | ICD-10-CM | POA: Diagnosis not present

## 2014-05-02 MED ORDER — PITAVASTATIN CALCIUM 2 MG PO TABS: 1.0000 | ORAL_TABLET | Freq: Every day | ORAL | Status: DC

## 2014-05-06 ENCOUNTER — Telehealth: Payer: Self-pay | Admitting: *Deleted

## 2014-05-06 DIAGNOSIS — K219 Gastro-esophageal reflux disease without esophagitis: Secondary | ICD-10-CM | POA: Diagnosis not present

## 2014-05-06 DIAGNOSIS — F329 Major depressive disorder, single episode, unspecified: Secondary | ICD-10-CM | POA: Diagnosis not present

## 2014-05-06 DIAGNOSIS — R262 Difficulty in walking, not elsewhere classified: Secondary | ICD-10-CM | POA: Diagnosis not present

## 2014-05-06 DIAGNOSIS — J45909 Unspecified asthma, uncomplicated: Secondary | ICD-10-CM | POA: Diagnosis not present

## 2014-05-06 DIAGNOSIS — F0390 Unspecified dementia without behavioral disturbance: Secondary | ICD-10-CM | POA: Diagnosis not present

## 2014-05-06 DIAGNOSIS — I10 Essential (primary) hypertension: Secondary | ICD-10-CM | POA: Diagnosis not present

## 2014-05-06 NOTE — Telephone Encounter (Signed)
Please make sure that you get the patient to do an FOBT and have the nurse do a CBC

## 2014-05-06 NOTE — Telephone Encounter (Signed)
[facility] nurse a detailed message and to contact us with any further questions. Also faxed an updated list of medications per nurses request

## 2014-05-06 NOTE — Telephone Encounter (Signed)
Traci Wheeler with [facility] went out to see patient to day and Antoinette told her that she has been having black stools for months and she hasn't told her Dr about them because she keeps forgetting. Nurse states that she is weak and would like to know what you would like done. Please advise. Traci Wheeler can be reached at [phone]

## 2014-05-07 ENCOUNTER — Other Ambulatory Visit: Payer: Self-pay | Admitting: *Deleted

## 2014-05-07 ENCOUNTER — Encounter (INDEPENDENT_AMBULATORY_CARE_PROVIDER_SITE_OTHER): Payer: Medicare Other | Admitting: Family Medicine

## 2014-05-07 DIAGNOSIS — R5381 Other malaise: Secondary | ICD-10-CM

## 2014-05-07 DIAGNOSIS — F29 Unspecified psychosis not due to a substance or known physiological condition: Secondary | ICD-10-CM | POA: Diagnosis not present

## 2014-05-07 DIAGNOSIS — R410 Disorientation, unspecified: Secondary | ICD-10-CM | POA: Diagnosis not present

## 2014-05-07 DIAGNOSIS — R5383 Other fatigue: Secondary | ICD-10-CM

## 2014-05-07 LAB — POCT UA - MICROSCOPIC ONLY
Bacteria, U Microscopic: NEGATIVE
CASTS, UR, LPF, POC: NEGATIVE
Crystals, Ur, HPF, POC: NEGATIVE
Mucus, UA: NEGATIVE
RBC, urine, microscopic: NEGATIVE
Yeast, UA: NEGATIVE

## 2014-05-07 LAB — POCT URINALYSIS DIPSTICK
Bilirubin, UA: NEGATIVE
Blood, UA: NEGATIVE
Glucose, UA: NEGATIVE
Ketones, UA: NEGATIVE
Nitrite, UA: NEGATIVE
Protein, UA: NEGATIVE
Spec Grav, UA: 1.01
UROBILINOGEN UA: NEGATIVE
pH, UA: 7.5

## 2014-05-07 MED ORDER — CIPROFLOXACIN HCL 250 MG PO TABS
250.0000 mg | ORAL_TABLET | Freq: Two times a day (BID) | ORAL | Status: DC
Start: 2014-05-07 — End: 2014-06-29

## 2014-05-07 NOTE — Progress Notes (Signed)
This encounter was created in error - please disregard.

## 2014-05-08 ENCOUNTER — Telehealth: Payer: Self-pay | Admitting: *Deleted

## 2014-05-08 LAB — CBC WITH DIFFERENTIAL
BASOS: 1 %
Basophils Absolute: 0 10*3/uL (ref 0.0–0.2)
EOS ABS: 0.1 10*3/uL (ref 0.0–0.4)
Eos: 2 %
HCT: 39.5 % (ref 34.0–46.6)
HEMOGLOBIN: 13.6 g/dL (ref 11.1–15.9)
Immature Grans (Abs): 0 10*3/uL (ref 0.0–0.1)
Immature Granulocytes: 0 %
LYMPHS: 30 %
Lymphocytes Absolute: 2.2 10*3/uL (ref 0.7–3.1)
MCH: 30.4 pg (ref 26.6–33.0)
MCHC: 34.4 g/dL (ref 31.5–35.7)
MCV: 88 fL (ref 79–97)
Monocytes Absolute: 0.7 10*3/uL (ref 0.1–0.9)
Monocytes: 10 %
NEUTROS ABS: 4.2 10*3/uL (ref 1.4–7.0)
Neutrophils Relative %: 57 %
Platelets: 413 10*3/uL — ABNORMAL HIGH (ref 150–379)
RBC: 4.48 x10E6/uL (ref 3.77–5.28)
RDW: 14.6 % (ref 12.3–15.4)
WBC: 7.2 10*3/uL (ref 3.4–10.8)

## 2014-05-08 LAB — BMP8+EGFR
BUN / CREAT RATIO: 8 — AB (ref 11–26)
BUN: 10 mg/dL (ref 8–27)
CO2: 25 mmol/L (ref 18–29)
CREATININE: 1.3 mg/dL — AB (ref 0.57–1.00)
Calcium: 9.9 mg/dL (ref 8.7–10.3)
Chloride: 100 mmol/L (ref 97–108)
GFR calc non Af Amer: 38 mL/min/{1.73_m2} — ABNORMAL LOW (ref >59)
GFR, EST AFRICAN AMERICAN: 44 mL/min/{1.73_m2} — AB (ref >59)
Glucose: 146 mg/dL — ABNORMAL HIGH (ref 65–99)
Potassium: 3.6 mmol/L (ref 3.5–5.2)
SODIUM: 142 mmol/L (ref 134–144)

## 2014-05-08 LAB — HEPATIC FUNCTION PANEL
ALBUMIN: 4.4 g/dL (ref 3.5–4.7)
ALK PHOS: 77 IU/L (ref 39–117)
ALT: 9 IU/L (ref 0–32)
AST: 17 IU/L (ref 0–40)
BILIRUBIN TOTAL: 0.3 mg/dL (ref 0.0–1.2)
Bilirubin, Direct: 0.13 mg/dL (ref 0.00–0.40)
Total Protein: 6.5 g/dL (ref 6.0–8.5)

## 2014-05-08 NOTE — Telephone Encounter (Signed)
Ok per Dr. Laurance Flatten to add speech therapy. Magda Paganini with [facility] aware.

## 2014-05-09 ENCOUNTER — Telehealth: Payer: Self-pay | Admitting: *Deleted

## 2014-05-09 DIAGNOSIS — J45909 Unspecified asthma, uncomplicated: Secondary | ICD-10-CM | POA: Diagnosis not present

## 2014-05-09 DIAGNOSIS — F0390 Unspecified dementia without behavioral disturbance: Secondary | ICD-10-CM | POA: Diagnosis not present

## 2014-05-09 DIAGNOSIS — F329 Major depressive disorder, single episode, unspecified: Secondary | ICD-10-CM | POA: Diagnosis not present

## 2014-05-09 DIAGNOSIS — R262 Difficulty in walking, not elsewhere classified: Secondary | ICD-10-CM | POA: Diagnosis not present

## 2014-05-09 DIAGNOSIS — I10 Essential (primary) hypertension: Secondary | ICD-10-CM | POA: Diagnosis not present

## 2014-05-09 DIAGNOSIS — K219 Gastro-esophageal reflux disease without esophagitis: Secondary | ICD-10-CM | POA: Diagnosis not present

## 2014-05-09 LAB — URINE CULTURE

## 2014-05-09 NOTE — Telephone Encounter (Signed)
Pt called, cannot remember if she mentioned her loss of appetite and constipation to Dr. Laurance Flatten.  I advised she discussed it with him April 2015.  She says it is still a problem, and has never sent in a stool sample.  Requests call back from Dr. Tawanna Sat nurse.

## 2014-05-09 NOTE — Telephone Encounter (Signed)
notified pt's niece of results Verbalizes understanding

## 2014-05-09 NOTE — Telephone Encounter (Signed)
Message copied by Marin Olp on Thu May 09, 2014  3:16 PM ------      Message from: Chipper Herb      Created: Thu May 09, 2014  2:06 PM       Please call the patient's niece and the home health nurse with a result of this culture report. The antibiotic that the patient has been put on to treat the urinary tract infection is appropriate for the bacteria that are growing. This is Cipro. The patient should make sure that she continues and completes the antibiotic. A urinalysis should be rechecked a few days after completing the antibiotic ------

## 2014-05-09 NOTE — Telephone Encounter (Signed)
Cathy @[facility]  just wanted to let us know that Corrine stated that she was having trouble sleeping and states that she is waking up with tremors. She stated that this has been happening for a while but has forgot to tell us.

## 2014-05-09 NOTE — Telephone Encounter (Signed)
List hold off on anything for sleeping at this point in time and take the antibiotic that has been prescribed

## 2014-05-09 NOTE — Telephone Encounter (Signed)
Spoke with pt- she thinks now that bowel problems are from her not eating rather than actual constipation  Pt aware that nadine will be called to come by and get a fobt card to check stool for blood Pt also aware to try and eat more and to drink plenty of fluids.      Nadine called and aware to pick up FOBT- up front

## 2014-05-10 DIAGNOSIS — R262 Difficulty in walking, not elsewhere classified: Secondary | ICD-10-CM | POA: Diagnosis not present

## 2014-05-10 DIAGNOSIS — K219 Gastro-esophageal reflux disease without esophagitis: Secondary | ICD-10-CM | POA: Diagnosis not present

## 2014-05-10 DIAGNOSIS — F329 Major depressive disorder, single episode, unspecified: Secondary | ICD-10-CM | POA: Diagnosis not present

## 2014-05-10 DIAGNOSIS — J45909 Unspecified asthma, uncomplicated: Secondary | ICD-10-CM | POA: Diagnosis not present

## 2014-05-10 DIAGNOSIS — F0390 Unspecified dementia without behavioral disturbance: Secondary | ICD-10-CM | POA: Diagnosis not present

## 2014-05-10 DIAGNOSIS — I10 Essential (primary) hypertension: Secondary | ICD-10-CM | POA: Diagnosis not present

## 2014-05-13 DIAGNOSIS — K219 Gastro-esophageal reflux disease without esophagitis: Secondary | ICD-10-CM | POA: Diagnosis not present

## 2014-05-13 DIAGNOSIS — J45909 Unspecified asthma, uncomplicated: Secondary | ICD-10-CM | POA: Diagnosis not present

## 2014-05-13 DIAGNOSIS — I10 Essential (primary) hypertension: Secondary | ICD-10-CM | POA: Diagnosis not present

## 2014-05-13 DIAGNOSIS — R262 Difficulty in walking, not elsewhere classified: Secondary | ICD-10-CM | POA: Diagnosis not present

## 2014-05-13 DIAGNOSIS — F329 Major depressive disorder, single episode, unspecified: Secondary | ICD-10-CM | POA: Diagnosis not present

## 2014-05-13 DIAGNOSIS — F0390 Unspecified dementia without behavioral disturbance: Secondary | ICD-10-CM | POA: Diagnosis not present

## 2014-05-14 ENCOUNTER — Other Ambulatory Visit (INDEPENDENT_AMBULATORY_CARE_PROVIDER_SITE_OTHER): Payer: Medicare Other

## 2014-05-14 DIAGNOSIS — K219 Gastro-esophageal reflux disease without esophagitis: Secondary | ICD-10-CM | POA: Diagnosis not present

## 2014-05-14 DIAGNOSIS — B3749 Other urogenital candidiasis: Secondary | ICD-10-CM

## 2014-05-14 DIAGNOSIS — F329 Major depressive disorder, single episode, unspecified: Secondary | ICD-10-CM | POA: Diagnosis not present

## 2014-05-14 DIAGNOSIS — I10 Essential (primary) hypertension: Secondary | ICD-10-CM | POA: Diagnosis not present

## 2014-05-14 DIAGNOSIS — J45909 Unspecified asthma, uncomplicated: Secondary | ICD-10-CM | POA: Diagnosis not present

## 2014-05-14 DIAGNOSIS — R262 Difficulty in walking, not elsewhere classified: Secondary | ICD-10-CM | POA: Diagnosis not present

## 2014-05-14 DIAGNOSIS — F0390 Unspecified dementia without behavioral disturbance: Secondary | ICD-10-CM | POA: Diagnosis not present

## 2014-05-14 LAB — POCT UA - MICROSCOPIC ONLY
Bacteria, U Microscopic: NEGATIVE
Casts, Ur, LPF, POC: NEGATIVE
Crystals, Ur, HPF, POC: NEGATIVE
RBC, urine, microscopic: NEGATIVE
YEAST UA: NEGATIVE

## 2014-05-14 LAB — POCT URINALYSIS DIPSTICK
Bilirubin, UA: NEGATIVE
Glucose, UA: NEGATIVE
KETONES UA: NEGATIVE
Nitrite, UA: NEGATIVE
PH UA: 7.5
RBC UA: NEGATIVE
Spec Grav, UA: 1.01
Urobilinogen, UA: NEGATIVE

## 2014-05-14 NOTE — Addendum Note (Signed)
Addended by: Earlene Plater on: 05/14/2014 04:51 PM   Modules accepted: Orders

## 2014-05-14 NOTE — Progress Notes (Signed)
Lab only 

## 2014-05-16 DIAGNOSIS — F0390 Unspecified dementia without behavioral disturbance: Secondary | ICD-10-CM | POA: Diagnosis not present

## 2014-05-16 DIAGNOSIS — I10 Essential (primary) hypertension: Secondary | ICD-10-CM | POA: Diagnosis not present

## 2014-05-16 DIAGNOSIS — K219 Gastro-esophageal reflux disease without esophagitis: Secondary | ICD-10-CM | POA: Diagnosis not present

## 2014-05-16 DIAGNOSIS — F329 Major depressive disorder, single episode, unspecified: Secondary | ICD-10-CM | POA: Diagnosis not present

## 2014-05-16 DIAGNOSIS — R262 Difficulty in walking, not elsewhere classified: Secondary | ICD-10-CM | POA: Diagnosis not present

## 2014-05-16 DIAGNOSIS — J45909 Unspecified asthma, uncomplicated: Secondary | ICD-10-CM | POA: Diagnosis not present

## 2014-05-16 LAB — URINE CULTURE: Organism ID, Bacteria: NO GROWTH

## 2014-05-17 ENCOUNTER — Telehealth: Payer: Self-pay | Admitting: Family Medicine

## 2014-05-17 DIAGNOSIS — J45909 Unspecified asthma, uncomplicated: Secondary | ICD-10-CM | POA: Diagnosis not present

## 2014-05-17 DIAGNOSIS — R262 Difficulty in walking, not elsewhere classified: Secondary | ICD-10-CM | POA: Diagnosis not present

## 2014-05-17 DIAGNOSIS — K219 Gastro-esophageal reflux disease without esophagitis: Secondary | ICD-10-CM | POA: Diagnosis not present

## 2014-05-17 DIAGNOSIS — F329 Major depressive disorder, single episode, unspecified: Secondary | ICD-10-CM | POA: Diagnosis not present

## 2014-05-17 DIAGNOSIS — I10 Essential (primary) hypertension: Secondary | ICD-10-CM | POA: Diagnosis not present

## 2014-05-17 DIAGNOSIS — F0390 Unspecified dementia without behavioral disturbance: Secondary | ICD-10-CM | POA: Diagnosis not present

## 2014-05-17 NOTE — Telephone Encounter (Signed)
Appointment given for Monday at 4 with Laurance Flatten- and I called and left a message with Justice Rocher also about the appointment.

## 2014-05-20 ENCOUNTER — Ambulatory Visit: Payer: Medicare Other | Admitting: Family Medicine

## 2014-05-21 DIAGNOSIS — K219 Gastro-esophageal reflux disease without esophagitis: Secondary | ICD-10-CM | POA: Diagnosis not present

## 2014-05-21 DIAGNOSIS — F329 Major depressive disorder, single episode, unspecified: Secondary | ICD-10-CM | POA: Diagnosis not present

## 2014-05-21 DIAGNOSIS — F0390 Unspecified dementia without behavioral disturbance: Secondary | ICD-10-CM | POA: Diagnosis not present

## 2014-05-21 DIAGNOSIS — J45909 Unspecified asthma, uncomplicated: Secondary | ICD-10-CM | POA: Diagnosis not present

## 2014-05-21 DIAGNOSIS — R262 Difficulty in walking, not elsewhere classified: Secondary | ICD-10-CM | POA: Diagnosis not present

## 2014-05-21 DIAGNOSIS — I10 Essential (primary) hypertension: Secondary | ICD-10-CM | POA: Diagnosis not present

## 2014-05-22 ENCOUNTER — Telehealth: Payer: Self-pay | Admitting: Family Medicine

## 2014-05-22 DIAGNOSIS — J45909 Unspecified asthma, uncomplicated: Secondary | ICD-10-CM | POA: Diagnosis not present

## 2014-05-22 DIAGNOSIS — K219 Gastro-esophageal reflux disease without esophagitis: Secondary | ICD-10-CM | POA: Diagnosis not present

## 2014-05-22 DIAGNOSIS — I10 Essential (primary) hypertension: Secondary | ICD-10-CM | POA: Diagnosis not present

## 2014-05-22 DIAGNOSIS — F0390 Unspecified dementia without behavioral disturbance: Secondary | ICD-10-CM | POA: Diagnosis not present

## 2014-05-22 DIAGNOSIS — R262 Difficulty in walking, not elsewhere classified: Secondary | ICD-10-CM | POA: Diagnosis not present

## 2014-05-22 DIAGNOSIS — F329 Major depressive disorder, single episode, unspecified: Secondary | ICD-10-CM | POA: Diagnosis not present

## 2014-05-22 NOTE — Telephone Encounter (Signed)
Discussed getting medication filled at [facility] or other pharmacy that will package medications.  I have mentioned several times to patient's husband in past.  Bethena Roys is going to talk to them again about this option which I think is the best option for them.

## 2014-05-23 NOTE — Progress Notes (Signed)
done

## 2014-05-24 ENCOUNTER — Other Ambulatory Visit: Payer: Self-pay | Admitting: Pharmacist

## 2014-05-24 DIAGNOSIS — F0390 Unspecified dementia without behavioral disturbance: Secondary | ICD-10-CM | POA: Diagnosis not present

## 2014-05-24 DIAGNOSIS — I10 Essential (primary) hypertension: Secondary | ICD-10-CM | POA: Diagnosis not present

## 2014-05-24 DIAGNOSIS — R262 Difficulty in walking, not elsewhere classified: Secondary | ICD-10-CM | POA: Diagnosis not present

## 2014-05-24 DIAGNOSIS — F329 Major depressive disorder, single episode, unspecified: Secondary | ICD-10-CM | POA: Diagnosis not present

## 2014-05-24 DIAGNOSIS — J45909 Unspecified asthma, uncomplicated: Secondary | ICD-10-CM | POA: Diagnosis not present

## 2014-05-24 DIAGNOSIS — K219 Gastro-esophageal reflux disease without esophagitis: Secondary | ICD-10-CM | POA: Diagnosis not present

## 2014-05-24 MED ORDER — SIMVASTATIN 20 MG PO TABS: 20.0000 mg | ORAL_TABLET | Freq: Every day | ORAL | Status: DC

## 2014-05-27 ENCOUNTER — Other Ambulatory Visit: Payer: Self-pay | Admitting: Pharmacist

## 2014-05-27 ENCOUNTER — Telehealth: Payer: Self-pay | Admitting: Pharmacist

## 2014-05-27 DIAGNOSIS — F329 Major depressive disorder, single episode, unspecified: Secondary | ICD-10-CM

## 2014-05-27 DIAGNOSIS — F32A Depression, unspecified: Secondary | ICD-10-CM

## 2014-05-27 MED ORDER — MEMANTINE HCL-DONEPEZIL HCL ER 28-10 MG PO CP24: 1.0000 | ORAL_CAPSULE | Freq: Every evening | ORAL | Status: DC

## 2014-05-27 MED ORDER — BUSPIRONE HCL 15 MG PO TABS: 15.0000 mg | ORAL_TABLET | ORAL | Status: DC

## 2014-05-27 MED ORDER — LAMOTRIGINE 25 MG PO TABS: 25.0000 mg | ORAL_TABLET | Freq: Two times a day (BID) | ORAL | Status: DC

## 2014-05-27 MED ORDER — ALBUTEROL SULFATE HFA 108 (90 BASE) MCG/ACT IN AERS: 2.0000 | INHALATION_SPRAY | Freq: Four times a day (QID) | RESPIRATORY_TRACT | Status: DC | PRN

## 2014-05-27 NOTE — Telephone Encounter (Signed)
Opened in error

## 2014-05-27 NOTE — Telephone Encounter (Signed)
Traci Wheeler is going to start filling patient's medications/ packaging and delivering.  Traci Wheeler states they need rx's for albuterol inhaler, buspar and lamictal for Traci Wheeler. Rx's sent to pharmacy

## 2014-05-28 DIAGNOSIS — I10 Essential (primary) hypertension: Secondary | ICD-10-CM | POA: Diagnosis not present

## 2014-05-28 DIAGNOSIS — R262 Difficulty in walking, not elsewhere classified: Secondary | ICD-10-CM | POA: Diagnosis not present

## 2014-05-28 DIAGNOSIS — F0390 Unspecified dementia without behavioral disturbance: Secondary | ICD-10-CM | POA: Diagnosis not present

## 2014-05-28 DIAGNOSIS — K219 Gastro-esophageal reflux disease without esophagitis: Secondary | ICD-10-CM | POA: Diagnosis not present

## 2014-05-28 DIAGNOSIS — F329 Major depressive disorder, single episode, unspecified: Secondary | ICD-10-CM | POA: Diagnosis not present

## 2014-05-28 DIAGNOSIS — J45909 Unspecified asthma, uncomplicated: Secondary | ICD-10-CM | POA: Diagnosis not present

## 2014-05-30 ENCOUNTER — Telehealth: Payer: Self-pay | Admitting: Family Medicine

## 2014-05-30 ENCOUNTER — Telehealth: Payer: Self-pay | Admitting: Pharmacist

## 2014-05-30 DIAGNOSIS — R61 Generalized hyperhidrosis: Secondary | ICD-10-CM

## 2014-05-30 DIAGNOSIS — J45909 Unspecified asthma, uncomplicated: Secondary | ICD-10-CM | POA: Diagnosis not present

## 2014-05-30 DIAGNOSIS — R262 Difficulty in walking, not elsewhere classified: Secondary | ICD-10-CM | POA: Diagnosis not present

## 2014-05-30 DIAGNOSIS — I10 Essential (primary) hypertension: Secondary | ICD-10-CM | POA: Diagnosis not present

## 2014-05-30 DIAGNOSIS — K219 Gastro-esophageal reflux disease without esophagitis: Secondary | ICD-10-CM | POA: Diagnosis not present

## 2014-05-30 DIAGNOSIS — F329 Major depressive disorder, single episode, unspecified: Secondary | ICD-10-CM | POA: Diagnosis not present

## 2014-05-30 DIAGNOSIS — F0390 Unspecified dementia without behavioral disturbance: Secondary | ICD-10-CM | POA: Diagnosis not present

## 2014-05-30 NOTE — Telephone Encounter (Signed)
I called patient about "hot spells"  Per patient she has been having these hot flashes for "awhile"  - patient cannot remember if its been a year, month or week.  She states that it is occurring at night and she wakes up sweating.   She had an episode of weakness yesterday and continues to have poor appetite.  She is concerned that something is going on.  Will forward to her PCP Dr Laurance Flatten.

## 2014-05-30 NOTE — Telephone Encounter (Signed)
Lab ordered Coming at 2 pm 05/31/14

## 2014-05-30 NOTE — Telephone Encounter (Signed)
CBC, urinalysis and thyroid profile

## 2014-05-30 NOTE — Telephone Encounter (Signed)
Duplicate note

## 2014-05-31 ENCOUNTER — Other Ambulatory Visit: Payer: Medicare Other

## 2014-06-04 DIAGNOSIS — F329 Major depressive disorder, single episode, unspecified: Secondary | ICD-10-CM | POA: Diagnosis not present

## 2014-06-04 DIAGNOSIS — J45909 Unspecified asthma, uncomplicated: Secondary | ICD-10-CM | POA: Diagnosis not present

## 2014-06-04 DIAGNOSIS — I10 Essential (primary) hypertension: Secondary | ICD-10-CM | POA: Diagnosis not present

## 2014-06-04 DIAGNOSIS — F0390 Unspecified dementia without behavioral disturbance: Secondary | ICD-10-CM | POA: Diagnosis not present

## 2014-06-04 DIAGNOSIS — K219 Gastro-esophageal reflux disease without esophagitis: Secondary | ICD-10-CM | POA: Diagnosis not present

## 2014-06-04 DIAGNOSIS — R262 Difficulty in walking, not elsewhere classified: Secondary | ICD-10-CM | POA: Diagnosis not present

## 2014-06-09 DIAGNOSIS — F329 Major depressive disorder, single episode, unspecified: Secondary | ICD-10-CM | POA: Diagnosis not present

## 2014-06-09 DIAGNOSIS — J45909 Unspecified asthma, uncomplicated: Secondary | ICD-10-CM | POA: Diagnosis not present

## 2014-06-09 DIAGNOSIS — I10 Essential (primary) hypertension: Secondary | ICD-10-CM | POA: Diagnosis not present

## 2014-06-09 DIAGNOSIS — R262 Difficulty in walking, not elsewhere classified: Secondary | ICD-10-CM | POA: Diagnosis not present

## 2014-06-09 DIAGNOSIS — K219 Gastro-esophageal reflux disease without esophagitis: Secondary | ICD-10-CM | POA: Diagnosis not present

## 2014-06-09 DIAGNOSIS — F0390 Unspecified dementia without behavioral disturbance: Secondary | ICD-10-CM | POA: Diagnosis not present

## 2014-06-10 DIAGNOSIS — R262 Difficulty in walking, not elsewhere classified: Secondary | ICD-10-CM

## 2014-06-10 DIAGNOSIS — F329 Major depressive disorder, single episode, unspecified: Secondary | ICD-10-CM | POA: Diagnosis not present

## 2014-06-10 DIAGNOSIS — F0390 Unspecified dementia without behavioral disturbance: Secondary | ICD-10-CM

## 2014-06-10 DIAGNOSIS — I10 Essential (primary) hypertension: Secondary | ICD-10-CM

## 2014-06-13 ENCOUNTER — Ambulatory Visit (INDEPENDENT_AMBULATORY_CARE_PROVIDER_SITE_OTHER): Payer: Medicare Other | Admitting: Family Medicine

## 2014-06-13 ENCOUNTER — Encounter: Payer: Self-pay | Admitting: Family Medicine

## 2014-06-13 VITALS — BP 165/81 | HR 82 | Temp 96.7°F | Ht 64.0 in | Wt 150.0 lb

## 2014-06-13 DIAGNOSIS — F411 Generalized anxiety disorder: Secondary | ICD-10-CM

## 2014-06-13 DIAGNOSIS — F419 Anxiety disorder, unspecified: Secondary | ICD-10-CM | POA: Diagnosis not present

## 2014-06-13 DIAGNOSIS — R5383 Other fatigue: Secondary | ICD-10-CM | POA: Diagnosis not present

## 2014-06-13 DIAGNOSIS — R413 Other amnesia: Secondary | ICD-10-CM

## 2014-06-13 DIAGNOSIS — R531 Weakness: Secondary | ICD-10-CM

## 2014-06-13 DIAGNOSIS — R61 Generalized hyperhidrosis: Secondary | ICD-10-CM

## 2014-06-13 MED ORDER — LORAZEPAM 0.5 MG PO TABS: 0.5000 mg | ORAL_TABLET | Freq: Every day | ORAL | Status: DC

## 2014-06-13 NOTE — Progress Notes (Signed)
Subjective:    Patient ID: Traci Wheeler, female    DOB: 1928/07/04, 80 y.o.   MRN: [id number]  HPI Patient here today for a follow up on several acute problems. She is accompanied today by her husband and a care giver from her church. Raegan states today that she is not ready to move out of her house and is not ready to move to [facility].the patient comes to the visit today with her husband and counsel on aging caregiver. The capsule on aging caregiver indicates that food is delivered to this couple daily and that someone comes in in the evening and prepares it for them. The patient and the caregiver indicated that someone is coming in to keep the house cleaned. The patient continues to complain of decreased appetite shortness of breath and weakness in her legs. She complains of shaking episodes and night sweats which are especially bad every evening. The patient's weight has not changed from the middle of October and still remains at 150 pounds.        Patient Active Problem List   Diagnosis Date Noted  . Malaise and fatigue 10/01/2013  . Hypertension 12/14/2012  . Hyperlipemia 12/14/2012  . Dementia 12/11/2012  . Depression 10/04/2012  . ALLERGIC RHINITIS 07/19/2007  . ASTHMA 07/19/2007  . ESOPHAGEAL REFLUX 07/19/2007   Outpatient Encounter Prescriptions as of 06/13/2014  Medication Sig  . albuterol (PROVENTIL HFA;VENTOLIN HFA) 108 (90 BASE) MCG/ACT inhaler Inhale 2 puffs into the lungs every 6 (six) hours as needed for wheezing.  Marland Kitchen amLODipine (NORVASC) 10 MG tablet Take 10 mg by mouth daily.    Marland Kitchen buPROPion (WELLBUTRIN XL) 300 MG 24 hr tablet Take 300 mg by mouth every morning.  . busPIRone (BUSPAR) 15 MG tablet Take 1 tablet (15 mg total) by mouth every morning.  . cholecalciferol (VITAMIN D) 1000 UNITS tablet Take 2,000 Units by mouth daily.  . ciprofloxacin (CIPRO) 250 MG tablet Take 1 tablet (250 mg total) by mouth 2 (two) times daily.  . DULoxetine (CYMBALTA) 60 MG  capsule Take 1 capsule (60 mg total) by mouth daily.  Marland Kitchen lamoTRIgine (LAMICTAL) 25 MG tablet Take 1 tablet (25 mg total) by mouth 2 (two) times daily.  . Memantine HCl-Donepezil HCl (NAMZARIC) 28-10 MG CP24 Take 1 capsule by mouth every evening.  . mirtazapine (REMERON) 30 MG tablet Take 30 mg by mouth at bedtime.  . pantoprazole (PROTONIX) 40 MG tablet Take 1 tablet (40 mg total) by mouth daily.  . simvastatin (ZOCOR) 20 MG tablet Take 1 tablet (20 mg total) by mouth at bedtime.    Review of Systems  Constitutional: Positive for appetite change (decreased appetite).  HENT: Negative.   Eyes: Negative.   Respiratory: Positive for shortness of breath (mostly at night).   Cardiovascular: Negative.   Gastrointestinal: Negative.   Endocrine: Negative.        Night sweats  Genitourinary: Negative.   Musculoskeletal: Negative.   Skin: Negative.   Allergic/Immunologic: Negative.   Neurological: Positive for weakness (of legs at times- mostly at night).  Hematological: Negative.   Psychiatric/Behavioral: Positive for confusion and agitation. The patient is nervous/anxious (possible anxiety at night).        Objective:   Physical Exam  Constitutional: She is oriented to person, place, and time. She appears well-developed and well-nourished. No distress.  HENT:  Head: Normocephalic and atraumatic.  Right Ear: External ear normal.  Left Ear: External ear normal.  Nose: Nose normal.  Mouth/Throat: Oropharynx  is clear and moist. No oropharyngeal exudate.  Eyes: Conjunctivae and EOM are normal. Pupils are equal, round, and reactive to light. Right eye exhibits no discharge. Left eye exhibits no discharge. No scleral icterus.  Neck: Normal range of motion. Neck supple. No JVD present. No thyromegaly present.  There are no carotid bruits  Cardiovascular: Normal rate, regular rhythm, normal heart sounds and intact distal pulses.  Exam reveals no gallop and no friction rub.   No murmur  heard. The rhythm is regular at 72/m  Pulmonary/Chest: Effort normal and breath sounds normal. No respiratory distress. She has no wheezes. She has no rales. She exhibits no tenderness.  Abdominal: Soft. Bowel sounds are normal. She exhibits no mass. There is no tenderness. There is no rebound and no guarding.  The patient's umbilical hernia is still present.  Musculoskeletal: Normal range of motion. She exhibits no edema.  Lymphadenopathy:    She has no cervical adenopathy.  Neurological: She is alert and oriented to person, place, and time. She has normal reflexes. No cranial nerve deficit.  Skin: Skin is warm and dry. No rash noted.  Psychiatric: She has a normal mood and affect. Her behavior is normal. Judgment and thought content normal.  Nursing note and vitals reviewed.  BP 165/81 mmHg  Pulse 82  Temp(Src) 96.7 F (35.9 C) (Oral)  Ht _0  (1.626 m)  Wt 150 lb (68.04 kg)  BMI 25.73 kg/m2        Assessment & Plan:  1. Night sweats - BMP8+EGFR - Thyroid Panel With TSH - Urine culture - POCT urinalysis dipstick - POCT UA - Microscopic Only - CBC With differential/Platelet  2. Anxiety state - BMP8+EGFR - Thyroid Panel With TSH - Urine culture - POCT urinalysis dipstick - POCT UA - Microscopic Only - CBC With differential/Platelet  3. Weakness - BMP8+EGFR - Thyroid Panel With TSH - Urine culture - POCT urinalysis dipstick - POCT UA - Microscopic Only - CBC With differential/Platelet  4. Memory disorder  Meds ordered this encounter  Medications  . LORazepam (ATIVAN) 0.5 MG tablet    Sig: Take 1 tablet (0.5 mg total) by mouth at bedtime.    Dispense:  30 tablet    Refill:  [facility] pre- packs medications   Patient Instructions   Continue to take current medication We will add a lorazepam 0.5 at bedtime We will ask you to call us or come and visit [place] in one week for the progress with this medication to see if it is helping you feel  better At that time we will have the recent lab work that was drawn to review   Arrie Senate MD

## 2014-06-13 NOTE — Patient Instructions (Addendum)
  Continue to take current medication We will add a lorazepam 0.5 at bedtime We will ask you to call us or come and visit [place] in one week for the progress with this medication to see if it is helping you feel better At that time we will have the recent lab work that was drawn to review

## 2014-06-14 LAB — CBC WITH DIFFERENTIAL
BASOS ABS: 0 10*3/uL (ref 0.0–0.2)
Basos: 1 %
EOS ABS: 0.1 10*3/uL (ref 0.0–0.4)
Eos: 2 %
HCT: 38.4 % (ref 34.0–46.6)
Hemoglobin: 12.6 g/dL (ref 11.1–15.9)
IMMATURE GRANULOCYTES: 0 %
Immature Grans (Abs): 0 10*3/uL (ref 0.0–0.1)
LYMPHS ABS: 1.9 10*3/uL (ref 0.7–3.1)
LYMPHS: 29 %
MCH: 30.4 pg (ref 26.6–33.0)
MCHC: 32.8 g/dL (ref 31.5–35.7)
MCV: 93 fL (ref 79–97)
Monocytes Absolute: 0.7 10*3/uL (ref 0.1–0.9)
Monocytes: 11 %
Neutrophils Absolute: 3.8 10*3/uL (ref 1.4–7.0)
Neutrophils Relative %: 57 %
PLATELETS: 432 10*3/uL — AB (ref 150–379)
RBC: 4.15 x10E6/uL (ref 3.77–5.28)
RDW: 15 % (ref 12.3–15.4)
WBC: 6.6 10*3/uL (ref 3.4–10.8)

## 2014-06-14 LAB — BMP8+EGFR
BUN/Creatinine Ratio: 12 (ref 11–26)
BUN: 14 mg/dL (ref 8–27)
CO2: 29 mmol/L (ref 18–29)
CREATININE: 1.18 mg/dL — AB (ref 0.57–1.00)
Calcium: 9.8 mg/dL (ref 8.7–10.3)
Chloride: 100 mmol/L (ref 97–108)
GFR, EST AFRICAN AMERICAN: 49 mL/min/{1.73_m2} — AB (ref >59)
GFR, EST NON AFRICAN AMERICAN: 42 mL/min/{1.73_m2} — AB (ref >59)
GLUCOSE: 90 mg/dL (ref 65–99)
Potassium: 4.6 mmol/L (ref 3.5–5.2)
Sodium: 143 mmol/L (ref 134–144)

## 2014-06-14 LAB — THYROID PANEL WITH TSH
FREE THYROXINE INDEX: 2 (ref 1.2–4.9)
T3 Uptake Ratio: 26 % (ref 24–39)
T4 TOTAL: 7.8 ug/dL (ref 4.5–12.0)
TSH: 2.98 u[IU]/mL (ref 0.450–4.500)

## 2014-06-20 ENCOUNTER — Telehealth: Payer: Self-pay | Admitting: *Deleted

## 2014-06-20 NOTE — Telephone Encounter (Signed)
Traci Wheeler came by and stated that pt has slept good only one night since appt with [facility]  All others she has woke up as she was doing before, -- [place]

## 2014-06-26 ENCOUNTER — Other Ambulatory Visit: Payer: Self-pay | Admitting: *Deleted

## 2014-06-26 DIAGNOSIS — K219 Gastro-esophageal reflux disease without esophagitis: Secondary | ICD-10-CM | POA: Diagnosis not present

## 2014-06-26 DIAGNOSIS — F329 Major depressive disorder, single episode, unspecified: Secondary | ICD-10-CM | POA: Diagnosis not present

## 2014-06-26 DIAGNOSIS — R262 Difficulty in walking, not elsewhere classified: Secondary | ICD-10-CM | POA: Diagnosis not present

## 2014-06-26 DIAGNOSIS — I10 Essential (primary) hypertension: Secondary | ICD-10-CM | POA: Diagnosis not present

## 2014-06-26 DIAGNOSIS — J45909 Unspecified asthma, uncomplicated: Secondary | ICD-10-CM | POA: Diagnosis not present

## 2014-06-26 DIAGNOSIS — F0390 Unspecified dementia without behavioral disturbance: Secondary | ICD-10-CM | POA: Diagnosis not present

## 2014-06-26 MED ORDER — MEMANTINE HCL-DONEPEZIL HCL ER 28-10 MG PO CP24: 1.0000 | ORAL_CAPSULE | Freq: Every evening | ORAL | Status: DC

## 2014-06-29 ENCOUNTER — Ambulatory Visit (INDEPENDENT_AMBULATORY_CARE_PROVIDER_SITE_OTHER): Payer: Medicare Other | Admitting: Family Medicine

## 2014-06-29 VITALS — BP 156/83 | HR 84 | Temp 97.3°F | Ht 64.0 in | Wt 150.0 lb

## 2014-06-29 DIAGNOSIS — R5383 Other fatigue: Secondary | ICD-10-CM | POA: Diagnosis not present

## 2014-06-29 DIAGNOSIS — N390 Urinary tract infection, site not specified: Secondary | ICD-10-CM

## 2014-06-29 LAB — POCT UA - MICROSCOPIC ONLY
Bacteria, U Microscopic: NEGATIVE
Casts, Ur, LPF, POC: NEGATIVE
Crystals, Ur, HPF, POC: NEGATIVE
Mucus, UA: NEGATIVE
RBC, urine, microscopic: NEGATIVE
Yeast, UA: NEGATIVE

## 2014-06-29 LAB — POCT CBC
Granulocyte percent: 66.6 %G (ref 37–80)
HCT, POC: 37.5 % — AB (ref 37.7–47.9)
Hemoglobin: 11.9 g/dL — AB (ref 12.2–16.2)
Lymph, poc: 1.3 (ref 0.6–3.4)
MCH, POC: 29.9 pg (ref 27–31.2)
MCHC: 31.8 g/dL (ref 31.8–35.4)
MCV: 93.8 fL (ref 80–97)
MPV: 6.8 fL (ref 0–99.8)
POC Granulocyte: 3.5 (ref 2–6.9)
POC LYMPH PERCENT: 24.4 %L (ref 10–50)
Platelet Count, POC: 429 10*3/uL — AB (ref 142–424)
RBC: 4 M/uL — AB (ref 4.04–5.48)
RDW, POC: 16.2 %
WBC: 5.2 10*3/uL (ref 4.6–10.2)

## 2014-06-29 LAB — POCT URINALYSIS DIPSTICK
Bilirubin, UA: NEGATIVE
Blood, UA: NEGATIVE
Glucose, UA: NEGATIVE
Ketones, UA: NEGATIVE
Nitrite, UA: NEGATIVE
Protein, UA: NEGATIVE
Spec Grav, UA: 1.01
Urobilinogen, UA: NEGATIVE
pH, UA: 7.5

## 2014-06-29 MED ORDER — CIPROFLOXACIN HCL 250 MG PO TABS: 250.0000 mg | ORAL_TABLET | Freq: Two times a day (BID) | ORAL | Status: DC

## 2014-06-29 NOTE — Progress Notes (Signed)
   Subjective:    Patient ID: Traci Wheeler, female    DOB: 25-Mar-1928, 80 y.o.   MRN: [id number]  HPI Patient is here for c/o fatigue.  She has hx of dementia and her family accompanies her and state she has been weak and think she may be dehydrated.  Review of Systems  Constitutional: Negative for fever.  HENT: Negative for ear pain.   Eyes: Negative for discharge.  Respiratory: Negative for cough.   Cardiovascular: Negative for chest pain.  Gastrointestinal: Negative for abdominal distention.  Endocrine: Negative for polyuria.  Genitourinary: Negative for difficulty urinating.  Musculoskeletal: Negative for gait problem and neck pain.  Skin: Negative for color change and rash.  Neurological: Negative for speech difficulty and headaches.  Psychiatric/Behavioral: Negative for agitation.       Objective:    BP 156/83 mmHg  Pulse 84  Temp(Src) 97.3 F (36.3 C) (Oral)  Ht 5\' 4"  (1.626 m)  Wt 150 lb (68.04 kg)  BMI 25.73 kg/m2 Physical Exam  Constitutional: She is oriented to person, place, and time. She appears well-developed and well-nourished.  HENT:  Head: Normocephalic and atraumatic.  Mouth/Throat: Oropharynx is clear and moist.  Eyes: Pupils are equal, round, and reactive to light.  Neck: Normal range of motion. Neck supple.  Cardiovascular: Normal rate and regular rhythm.   No murmur heard. Pulmonary/Chest: Effort normal and breath sounds normal.  Abdominal: Soft. Bowel sounds are normal. There is no tenderness.  Neurological: She is alert and oriented to person, place, and time.  Skin: Skin is warm and dry.  Psychiatric: She has a normal mood and affect.    Results for orders placed or performed in visit on 06/29/14  POCT UA - Microscopic Only  Result Value Ref Range   WBC, Ur, HPF, POC 10-15    RBC, urine, microscopic neg    Bacteria, U Microscopic neg    Mucus, UA neg    Epithelial cells, urine per micros occ    Crystals, Ur, HPF, POC neg    Casts,  Ur, LPF, POC neg    Yeast, UA neg         Assessment & Plan:     ICD-9-CM ICD-10-CM   1. Other fatigue 780.79 R53.83 POCT UA - Microscopic Only     POCT urinalysis dipstick     POCT CBC     ciprofloxacin (CIPRO) 250 MG tablet     Urine culture  2. Urinary tract infection without hematuria, site unspecified 599.0 N39.0 ciprofloxacin (CIPRO) 250 MG tablet     Urine culture     No Follow-up on file.  Lysbeth Penner FNP

## 2014-07-03 ENCOUNTER — Other Ambulatory Visit (INDEPENDENT_AMBULATORY_CARE_PROVIDER_SITE_OTHER): Payer: Medicare Other

## 2014-07-03 ENCOUNTER — Telehealth: Payer: Self-pay | Admitting: Pharmacist

## 2014-07-03 ENCOUNTER — Other Ambulatory Visit: Payer: Self-pay | Admitting: *Deleted

## 2014-07-03 DIAGNOSIS — R300 Dysuria: Secondary | ICD-10-CM

## 2014-07-03 LAB — POCT URINALYSIS DIPSTICK
Bilirubin, UA: NEGATIVE
Blood, UA: NEGATIVE
GLUCOSE UA: NEGATIVE
Ketones, UA: NEGATIVE
LEUKOCYTES UA: NEGATIVE
Nitrite, UA: NEGATIVE
Protein, UA: NEGATIVE
Spec Grav, UA: <=1.005
UROBILINOGEN UA: NEGATIVE
pH, UA: 8

## 2014-07-03 LAB — POCT UA - MICROSCOPIC ONLY
CASTS, UR, LPF, POC: NEGATIVE
Crystals, Ur, HPF, POC: NEGATIVE
Mucus, UA: NEGATIVE
RBC, URINE, MICROSCOPIC: NEGATIVE
Yeast, UA: NEGATIVE

## 2014-07-03 LAB — URINE CULTURE

## 2014-07-03 NOTE — Telephone Encounter (Signed)
urinalysis showed occasional bacteria.   Per Dr Laurance Flatten a C and S was ordered but he recommended not starting ABX currently

## 2014-07-08 ENCOUNTER — Telehealth: Payer: Self-pay | Admitting: *Deleted

## 2014-07-08 ENCOUNTER — Other Ambulatory Visit: Payer: Self-pay | Admitting: Family Medicine

## 2014-07-08 NOTE — Telephone Encounter (Signed)
Traci Wheeler's ins. Co.denied her Namzaric because she does not have a diagnosis of moderate to severe Alzheimer's dementia and has not tried Rivastigmine transdermal patch. I talked with Tammy and she could not give me any argument for namzaric over namenda and aricept separate.

## 2014-07-08 NOTE — Telephone Encounter (Signed)
Use the individual components of Namenda and Aricept and not the combined medication

## 2014-07-08 NOTE — Telephone Encounter (Signed)
Last seen 06/29/14 [place] If approved route to nurse to call [facility]

## 2014-07-09 ENCOUNTER — Ambulatory Visit (INDEPENDENT_AMBULATORY_CARE_PROVIDER_SITE_OTHER): Payer: Medicare Other | Admitting: Family Medicine

## 2014-07-09 ENCOUNTER — Encounter: Payer: Self-pay | Admitting: Family Medicine

## 2014-07-09 VITALS — BP 159/78 | HR 75 | Temp 97.0°F | Ht 64.0 in | Wt 155.0 lb

## 2014-07-09 DIAGNOSIS — N390 Urinary tract infection, site not specified: Secondary | ICD-10-CM | POA: Diagnosis not present

## 2014-07-09 DIAGNOSIS — R531 Weakness: Secondary | ICD-10-CM | POA: Diagnosis not present

## 2014-07-09 DIAGNOSIS — F32A Depression, unspecified: Secondary | ICD-10-CM

## 2014-07-09 DIAGNOSIS — R300 Dysuria: Secondary | ICD-10-CM | POA: Diagnosis not present

## 2014-07-09 DIAGNOSIS — F329 Major depressive disorder, single episode, unspecified: Secondary | ICD-10-CM | POA: Diagnosis not present

## 2014-07-09 LAB — POCT CBC
Granulocyte percent: 67 %G (ref 37–80)
HCT, POC: 39.7 % (ref 37.7–47.9)
Hemoglobin: 12.7 g/dL (ref 12.2–16.2)
LYMPH, POC: 1.8 (ref 0.6–3.4)
MCH: 30.5 pg (ref 27–31.2)
MCHC: 31.9 g/dL (ref 31.8–35.4)
MCV: 95.5 fL (ref 80–97)
MPV: 7.6 fL (ref 0–99.8)
PLATELET COUNT, POC: 445 10*3/uL — AB (ref 142–424)
POC Granulocyte: 4.4 (ref 2–6.9)
POC LYMPH PERCENT: 26.6 %L (ref 10–50)
RBC: 4.2 M/uL (ref 4.04–5.48)
RDW, POC: 15.7 %
WBC: 6.6 10*3/uL (ref 4.6–10.2)

## 2014-07-09 LAB — POCT URINALYSIS DIPSTICK
Bilirubin, UA: NEGATIVE
Blood, UA: NEGATIVE
GLUCOSE UA: NEGATIVE
Ketones, UA: NEGATIVE
Nitrite, UA: NEGATIVE
PH UA: 6.5
Protein, UA: NEGATIVE
SPEC GRAV UA: 1.01
UROBILINOGEN UA: NEGATIVE

## 2014-07-09 LAB — POCT UA - MICROSCOPIC ONLY
BACTERIA, U MICROSCOPIC: NEGATIVE
Casts, Ur, LPF, POC: NEGATIVE
Crystals, Ur, HPF, POC: NEGATIVE
Mucus, UA: NEGATIVE
RBC, URINE, MICROSCOPIC: NEGATIVE
Yeast, UA: NEGATIVE

## 2014-07-09 MED ORDER — CIPROFLOXACIN HCL 250 MG PO TABS: 250.0000 mg | ORAL_TABLET | Freq: Two times a day (BID) | ORAL | Status: DC

## 2014-07-09 NOTE — Telephone Encounter (Signed)
Please review and advise.

## 2014-07-09 NOTE — Telephone Encounter (Signed)
rx called into [facility] per Dr Laurance Flatten

## 2014-07-09 NOTE — Progress Notes (Signed)
Subjective:    Patient ID: Traci Wheeler, female    DOB: 08-26-1927, 80 y.o.   MRN: [id number]  HPI Patient here today for follow up on uti and she also has some other issues to discuss today. The patient also complains of chills and decreased appetite. She is concerned about a shaking sensation in her right arm and says that her mouth burns. She only took a short course of antibiotics with low-dose Cipro for 3 days. She still complains of dysuria. Before the visit today her recent lab work was reviewed and her creatinine was slightly elevated but improved from previous readings. Her CBC had a normal white blood cell count and a good hemoglobin for the patient. Also she has had thyroid function tests checked earlier in December and these were normal. The patient is in very good spirits today and her driver that brought her here is making her laugh. Both her husband and the driver indicates that she does not eat a lot at home. She does say that she is resting better at nighttime.   Patient Active Problem List   Diagnosis Date Noted  . Malaise and fatigue 10/01/2013  . Hypertension 12/14/2012  . Hyperlipemia 12/14/2012  . Dementia 12/11/2012  . Depression 10/04/2012  . ALLERGIC RHINITIS 07/19/2007  . ASTHMA 07/19/2007  . ESOPHAGEAL REFLUX 07/19/2007   Outpatient Encounter Prescriptions as of 07/09/2014  Medication Sig  . albuterol (PROVENTIL HFA;VENTOLIN HFA) 108 (90 BASE) MCG/ACT inhaler Inhale 2 puffs into the lungs every 6 (six) hours as needed for wheezing.  Marland Kitchen amLODipine (NORVASC) 10 MG tablet Take 10 mg by mouth daily.    Marland Kitchen buPROPion (WELLBUTRIN XL) 300 MG 24 hr tablet Take 300 mg by mouth every morning.  . busPIRone (BUSPAR) 15 MG tablet Take 1 tablet (15 mg total) by mouth every morning.  . cholecalciferol (VITAMIN D) 1000 UNITS tablet Take 2,000 Units by mouth daily.  . DULoxetine (CYMBALTA) 60 MG capsule Take 1 capsule (60 mg total) by mouth daily.  Marland Kitchen lamoTRIgine (LAMICTAL) 25  MG tablet Take 1 tablet (25 mg total) by mouth 2 (two) times daily.  Marland Kitchen LORazepam (ATIVAN) 0.5 MG tablet TAKE ONE TABLET AT BEDTIME  . Memantine HCl-Donepezil HCl (NAMZARIC) 28-10 MG CP24 Take 1 capsule by mouth every evening.  . mirtazapine (REMERON) 30 MG tablet Take 30 mg by mouth at bedtime.  . pantoprazole (PROTONIX) 40 MG tablet TAKE 1 TABLET ONCE A DAY  . simvastatin (ZOCOR) 20 MG tablet Take 1 tablet (20 mg total) by mouth at bedtime.  . [DISCONTINUED] ciprofloxacin (CIPRO) 250 MG tablet Take 1 tablet (250 mg total) by mouth 2 (two) times daily.    Review of Systems  Constitutional: Positive for chills (recent infection) and appetite change (no appetite).  HENT: Negative.        Mouth burns  Eyes: Negative.   Respiratory: Negative.   Cardiovascular: Negative.   Gastrointestinal: Negative.   Endocrine: Negative.   Genitourinary: Positive for dysuria and frequency.  Musculoskeletal: Negative.   Skin: Negative.   Allergic/Immunologic: Negative.   Neurological: Negative.        Right arm tremors  Hematological: Negative.   Psychiatric/Behavioral: Negative.        No trouble with sleeping - per pt and husband       Objective:   Physical Exam  Constitutional: She is oriented to person, place, and time. She appears well-developed and well-nourished. No distress.  The patient looks good and seems to be  in good spirits and responded appropriately to the questions that were asked of her.  HENT:  Head: Normocephalic and atraumatic.  Right Ear: External ear normal.  Left Ear: External ear normal.  Nose: Nose normal.  Mouth/Throat: Oropharynx is clear and moist.  Eyes: Conjunctivae and EOM are normal. Pupils are equal, round, and reactive to light. Right eye exhibits no discharge. Left eye exhibits no discharge. No scleral icterus.  Neck: Normal range of motion. Neck supple. No thyromegaly present.  No adenopathy or carotid bruits  Cardiovascular: Normal rate, regular rhythm and  normal heart sounds.   No murmur heard. At 84/m  Pulmonary/Chest: Effort normal and breath sounds normal. No respiratory distress. She has no wheezes. She has no rales. She exhibits no tenderness.  Abdominal: Soft. Bowel sounds are normal. She exhibits no mass. There is no tenderness. There is no rebound and no guarding.  There was no abdominal tenderness or suprapubic tenderness today.  Musculoskeletal: Normal range of motion. She exhibits no edema or tenderness.  Lymphadenopathy:    She has no cervical adenopathy.  Neurological: She is alert and oriented to person, place, and time.  Skin: Skin is warm and dry. No rash noted.  Psychiatric: She has a normal mood and affect. Her behavior is normal. Judgment and thought content normal.  The patient's mood and affect were improved compared to previous visits.  Nursing note and vitals reviewed.   BP 159/78 mmHg  Pulse 75  Temp(Src) 97 F (36.1 C) (Oral)  Ht 5\' 4"  (1.626 m)  Wt 155 lb (70.308 kg)  BMI 26.59 kg/m2  The CBC done today had a normal white blood cell count at 6.6 and a good hemoglobin that was stable compared to previous readings.  Results for orders placed or performed in visit on 07/09/14  POCT urinalysis dipstick  Result Value Ref Range   Color, UA YELLOW    Clarity, UA CLOUDY    Glucose, UA NEG    Bilirubin, UA NEG    Ketones, UA NEG    Spec Grav, UA 1.010    Blood, UA NEG    pH, UA 6.5    Protein, UA NEG    Urobilinogen, UA negative    Nitrite, UA NEG    Leukocytes, UA Trace   POCT UA - Microscopic Only  Result Value Ref Range   WBC, Ur, HPF, POC 5-10    RBC, urine, microscopic NEG    Bacteria, U Microscopic NEG    Mucus, UA NEG    Epithelial cells, urine per micros OCC    Crystals, Ur, HPF, POC NEG    Casts, Ur, LPF, POC NEG    Yeast, UA NEG         Assessment & Plan:  1. Urinary tract infection without hematuria, site unspecified - POCT urinalysis dipstick - POCT UA - Microscopic Only - Urine  culture  2. Weakness  3. Depression -Improved  Meds ordered this encounter  Medications  . DISCONTD: ciprofloxacin (CIPRO) 250 MG tablet    Sig: Take 1 tablet (250 mg total) by mouth 2 (two) times daily.    Dispense:  10 tablet    Refill:  0  . ciprofloxacin (CIPRO) 250 MG tablet    Sig: Take 1 tablet (250 mg total) by mouth 2 (two) times daily.    Dispense:  20 tablet    Refill:  0   Patient Instructions  Continue to drink plenty of water Try to eat regularly and eat well Continue  current medication   Arrie Senate MD

## 2014-07-09 NOTE — Telephone Encounter (Signed)
Please refill these medications if the time is appropriate.

## 2014-07-09 NOTE — Addendum Note (Signed)
Addended by: Zannie Cove on: 07/09/2014 04:14 PM   Modules accepted: Orders

## 2014-07-09 NOTE — Patient Instructions (Signed)
Continue to drink plenty of water Try to eat regularly and eat well Continue current medication

## 2014-07-10 ENCOUNTER — Other Ambulatory Visit: Payer: Self-pay | Admitting: *Deleted

## 2014-07-10 LAB — BMP8+EGFR
BUN/Creatinine Ratio: 19 (ref 11–26)
BUN: 21 mg/dL (ref 8–27)
CALCIUM: 10.3 mg/dL (ref 8.7–10.3)
CO2: 28 mmol/L (ref 18–29)
CREATININE: 1.1 mg/dL — AB (ref 0.57–1.00)
Chloride: 99 mmol/L (ref 97–108)
GFR calc Af Amer: 53 mL/min/{1.73_m2} — ABNORMAL LOW (ref >59)
GFR, EST NON AFRICAN AMERICAN: 46 mL/min/{1.73_m2} — AB (ref >59)
GLUCOSE: 93 mg/dL (ref 65–99)
Potassium: 5.2 mmol/L (ref 3.5–5.2)
Sodium: 142 mmol/L (ref 134–144)

## 2014-07-10 MED ORDER — MEMANTINE HCL ER 28 MG PO CP24: 1.0000 | ORAL_CAPSULE | Freq: Every day | ORAL | Status: DC

## 2014-07-10 MED ORDER — DONEPEZIL HCL 10 MG PO TABS: 10.0000 mg | ORAL_TABLET | Freq: Every day | ORAL | Status: DC

## 2014-07-11 LAB — URINE CULTURE

## 2014-07-18 ENCOUNTER — Telehealth: Payer: Self-pay | Admitting: Family Medicine

## 2014-07-18 NOTE — Telephone Encounter (Signed)
Spoke to [employer] regarding this pt, she gave me the phone number of a family member that helps care for the Traci Wheeler). Called Traci Wheeler and she states she had just spoke with Traci Wheeler and she sent her husband over to check on Traci Wheeler and that she would go by after work to check on her. Will close encounter.

## 2014-07-22 ENCOUNTER — Other Ambulatory Visit: Payer: Self-pay | Admitting: Family Medicine

## 2014-07-22 ENCOUNTER — Telehealth: Payer: Self-pay | Admitting: Family Medicine

## 2014-07-22 NOTE — Telephone Encounter (Signed)
Pt notified that she should continue antibiotic and repeat urine when finished Verbalizes understanding

## 2014-07-23 ENCOUNTER — Other Ambulatory Visit: Payer: Self-pay | Admitting: Family Medicine

## 2014-07-24 ENCOUNTER — Ambulatory Visit: Payer: Medicare Other | Admitting: Family Medicine

## 2014-08-05 ENCOUNTER — Other Ambulatory Visit: Payer: Self-pay | Admitting: Family Medicine

## 2014-08-06 NOTE — Telephone Encounter (Signed)
lst seen 07/09/14  DW[facility]  If approved route to nurse to call into [facility]

## 2014-08-08 ENCOUNTER — Ambulatory Visit (INDEPENDENT_AMBULATORY_CARE_PROVIDER_SITE_OTHER): Payer: Medicare Other | Admitting: Family Medicine

## 2014-08-08 ENCOUNTER — Telehealth: Payer: Self-pay | Admitting: Family Medicine

## 2014-08-08 ENCOUNTER — Encounter: Payer: Self-pay | Admitting: Family Medicine

## 2014-08-08 VITALS — BP 158/80 | HR 75 | Temp 97.1°F | Ht 64.0 in | Wt 162.2 lb

## 2014-08-08 DIAGNOSIS — F0390 Unspecified dementia without behavioral disturbance: Secondary | ICD-10-CM

## 2014-08-08 DIAGNOSIS — R509 Fever, unspecified: Secondary | ICD-10-CM | POA: Diagnosis not present

## 2014-08-08 DIAGNOSIS — R413 Other amnesia: Secondary | ICD-10-CM | POA: Diagnosis not present

## 2014-08-08 DIAGNOSIS — F32A Depression, unspecified: Secondary | ICD-10-CM

## 2014-08-08 DIAGNOSIS — F329 Major depressive disorder, single episode, unspecified: Secondary | ICD-10-CM | POA: Diagnosis not present

## 2014-08-08 LAB — POCT URINALYSIS DIPSTICK
BILIRUBIN UA: NEGATIVE
GLUCOSE UA: NEGATIVE
Ketones, UA: NEGATIVE
Nitrite, UA: NEGATIVE
Protein, UA: NEGATIVE
RBC UA: NEGATIVE
Spec Grav, UA: <=1.005
Urobilinogen, UA: NEGATIVE
pH, UA: 8

## 2014-08-08 NOTE — Telephone Encounter (Signed)
Appointment given for today at 2;25 with [facility].

## 2014-08-08 NOTE — Progress Notes (Signed)
Subjective:    Patient ID: Traci Wheeler, female    DOB: 12/12/1927, 81 y.o.   MRN: [id number]  HPI  Patient is here today with complaint of fever and chills.  Denies any other symptoms. Hassan Rowan prepares a meal for her every evening.  Patient is concerned about her poor memory. She does not have any recollection of a fever. She says her appetite is poor. Otherwise she does not have any symptoms consistent with the chills noted above. She had a chill this morning that prompted a telephone call and a work in visit today. The chills happen occasionally. The patient is unable to recall frequency or severity.   patient's case discussed with Dr. Laurance Flatten, her primary care provider. He states that her situation is grave but that there are resources in the community helping in the home. The patient in fact stated that Hassan Rowan was helping cook meals for them in the evening. The pastor brought them to the office today. Dr. Laurance Flatten stated that the attempts at further intervention had been resisted by the patient and her husband. Specifically he has on several occasions asked that they consider moving to an assisted living facility due to their dementia. Dr. Laurance Flatten was concerned that discontinuing any medications could cause her to spiral into a deeper depression. We discussed the potential that the chills were coming from side effects of her antidepressant and other similar medications. He advised that those be left intact due to history of severe depression when they are not in use.      Allergies  Allergen Reactions  . Aspirin Nausea And Vomiting  . Codeine Phosphate     REACTION: unspecified  . Darvocet [Propoxyphene N-Acetaminophen]   . Glucosamine Forte [Nutritional Supplements]   . Naproxen     REACTION: unspecified  . Nsaids   . Sulfamethoxazole     REACTION: unspecified  . Sulfur Dioxide Itching    Outpatient Encounter Prescriptions as of 08/08/2014  Medication Sig  . albuterol (PROVENTIL  HFA;VENTOLIN HFA) 108 (90 BASE) MCG/ACT inhaler Inhale 2 puffs into the lungs every 6 (six) hours as needed for wheezing.  Marland Kitchen amLODipine (NORVASC) 10 MG tablet TAKE (1) TABLET DAILY IN THE MORNING.  Marland Kitchen buPROPion (WELLBUTRIN XL) 300 MG 24 hr tablet Take 300 mg by mouth every morning.  . busPIRone (BUSPAR) 15 MG tablet Take 1 tablet (15 mg total) by mouth every morning.  . cholecalciferol (VITAMIN D) 1000 UNITS tablet Take 2,000 Units by mouth daily.  Marland Kitchen donepezil (ARICEPT) 10 MG tablet Take 1 tablet (10 mg total) by mouth at bedtime.  . DULoxetine (CYMBALTA) 60 MG capsule Take 1 capsule (60 mg total) by mouth daily.  Marland Kitchen lamoTRIgine (LAMICTAL) 25 MG tablet Take 1 tablet (25 mg total) by mouth 2 (two) times daily.  Marland Kitchen LORazepam (ATIVAN) 0.5 MG tablet TAKE ONE TABLET AT BEDTIME  . Memantine HCl ER 28 MG CP24 Take 28 mg by mouth daily.  . Memantine HCl-Donepezil HCl (NAMZARIC) 28-10 MG CP24 Take 1 capsule by mouth every evening.  . mirtazapine (REMERON) 30 MG tablet Take 30 mg by mouth at bedtime.  . pantoprazole (PROTONIX) 40 MG tablet TAKE 1 TABLET ONCE A DAY  . simvastatin (ZOCOR) 20 MG tablet Take 1 tablet (20 mg total) by mouth at bedtime.  . [DISCONTINUED] ciprofloxacin (CIPRO) 250 MG tablet Take 1 tablet (250 mg total) by mouth 2 (two) times daily. (Patient not taking: Reported on 08/08/2014)    Past Medical History  Diagnosis Date  .  Hypertension   . Asthma   . Postmenopausal HRT (hormone replacement therapy)   . Depression   . Hyperlipidemia   . GERD (gastroesophageal reflux disease)   . Colon polyps   . RBBB (right bundle branch block)   . Gastric polyps   . Fibromyalgia   . Hiatal hernia     Past Surgical History  Procedure Laterality Date  . Lumbar spine surgery      Disc    History   Social History  . Marital Status: Married    Spouse Name: N/A    Number of Children: N/A  . Years of Education: N/A   Occupational History  . Not on file.   Social History Main Topics    . Smoking status: Never Smoker   . Smokeless tobacco: Not on file  . Alcohol Use: No  . Drug Use: No  . Sexual Activity: No   Other Topics Concern  . Not on file   Social History Narrative    Review of Systems  Constitutional: Negative for fever, chills, diaphoresis, appetite change, fatigue and unexpected weight change.  HENT: Negative for congestion, ear pain, hearing loss, postnasal drip, rhinorrhea, sneezing, sore throat and trouble swallowing.   Eyes: Negative for pain.  Respiratory: Negative for cough, chest tightness and shortness of breath.   Cardiovascular: Negative for chest pain and palpitations.  Gastrointestinal: Negative for nausea, vomiting, abdominal pain, diarrhea and constipation.  Genitourinary: Negative for dysuria, frequency and menstrual problem.  Musculoskeletal: Negative for joint swelling and arthralgias.  Skin: Negative for rash.  Neurological: Negative for speech difficulty and headaches.  Psychiatric/Behavioral: Positive for confusion. Negative for dysphoric mood and agitation.       Objective:   Physical Exam  Constitutional: She appears well-developed and well-nourished. No distress.  HENT:  Head: Normocephalic and atraumatic.  Right Ear: External ear normal.  Left Ear: External ear normal.  Nose: Nose normal.  Mouth/Throat: Oropharynx is clear and moist.  Eyes: Conjunctivae and EOM are normal. Pupils are equal, round, and reactive to light.  Neck: Normal range of motion. Neck supple. No thyromegaly present.  Cardiovascular: Normal rate, regular rhythm and normal heart sounds.   No murmur heard. Pulmonary/Chest: Effort normal and breath sounds normal. No respiratory distress. She has no wheezes. She has no rales.  Abdominal: Soft. Bowel sounds are normal. She exhibits no distension. There is no tenderness.  Lymphadenopathy:    She has no cervical adenopathy.  Neurological: She is alert. No cranial nerve deficit.  Skin: Skin is warm and dry.   Psychiatric: She has a normal mood and affect. Her behavior is normal.  MMSE performed. Pt. Scored 4/23  BP 158/80 mmHg  Pulse 75  Temp(Src) 97.1 F (36.2 C) (Oral)  Ht 5' 4"  (1.626 m)  Wt 162 lb 3.2 oz (73.573 kg)  BMI 27.83 kg/m2    Assessment & Plan:   1. Dementia, without behavioral disturbance   2. Depression   3. Memory disorder   4. Chills with fever     No orders of the defined types were placed in this encounter.    Orders Placed This Encounter  Procedures  . TSH  . Vitamin B12  . CMP14+EGFR  . CBC with Differential/Platelet  . POCT urinalysis dipstick    Labs pending Health Maintenance reviewed Diet and exercise encouraged Continue all meds as discussed Follow up in 3 mo   Claretta Fraise, MD

## 2014-08-09 LAB — CBC WITH DIFFERENTIAL/PLATELET
BASOS ABS: 0 10*3/uL (ref 0.0–0.2)
BASOS: 0 %
Eos: 2 %
Eosinophils Absolute: 0.2 10*3/uL (ref 0.0–0.4)
HEMATOCRIT: 38.4 % (ref 34.0–46.6)
Hemoglobin: 12.7 g/dL (ref 11.1–15.9)
IMMATURE GRANULOCYTES: 0 %
Immature Grans (Abs): 0 10*3/uL (ref 0.0–0.1)
LYMPHS: 23 %
Lymphocytes Absolute: 2 10*3/uL (ref 0.7–3.1)
MCH: 30.7 pg (ref 26.6–33.0)
MCHC: 33.1 g/dL (ref 31.5–35.7)
MCV: 93 fL (ref 79–97)
Monocytes Absolute: 0.7 10*3/uL (ref 0.1–0.9)
Monocytes: 8 %
Neutrophils Absolute: 5.6 10*3/uL (ref 1.4–7.0)
Neutrophils Relative %: 67 %
Platelets: 464 10*3/uL — ABNORMAL HIGH (ref 150–379)
RBC: 4.14 x10E6/uL (ref 3.77–5.28)
RDW: 13.7 % (ref 12.3–15.4)
WBC: 8.5 10*3/uL (ref 3.4–10.8)

## 2014-08-09 LAB — CMP14+EGFR
A/G RATIO: 2 (ref 1.1–2.5)
ALK PHOS: 94 IU/L (ref 39–117)
ALT: 11 IU/L (ref 0–32)
AST: 19 IU/L (ref 0–40)
Albumin: 4.6 g/dL (ref 3.5–4.7)
BILIRUBIN TOTAL: 0.3 mg/dL (ref 0.0–1.2)
BUN / CREAT RATIO: 15 (ref 11–26)
BUN: 18 mg/dL (ref 8–27)
CO2: 27 mmol/L (ref 18–29)
Calcium: 10 mg/dL (ref 8.7–10.3)
Chloride: 97 mmol/L (ref 97–108)
Creatinine, Ser: 1.23 mg/dL — ABNORMAL HIGH (ref 0.57–1.00)
GFR calc Af Amer: 47 mL/min/{1.73_m2} — ABNORMAL LOW (ref >59)
GFR calc non Af Amer: 40 mL/min/{1.73_m2} — ABNORMAL LOW (ref >59)
Globulin, Total: 2.3 g/dL (ref 1.5–4.5)
Glucose: 88 mg/dL (ref 65–99)
POTASSIUM: 5.3 mmol/L — AB (ref 3.5–5.2)
Sodium: 141 mmol/L (ref 134–144)
Total Protein: 6.9 g/dL (ref 6.0–8.5)

## 2014-08-09 LAB — TSH: TSH: 4.64 u[IU]/mL — ABNORMAL HIGH (ref 0.450–4.500)

## 2014-08-09 LAB — VITAMIN B12: Vitamin B-12: 301 pg/mL (ref 211–946)

## 2014-08-12 ENCOUNTER — Telehealth: Payer: Self-pay | Admitting: *Deleted

## 2014-08-12 NOTE — Telephone Encounter (Signed)
Patient called, and is concerned that she is still having chills.  She has not taken her temperature as she does not have a thermometer.  She would like to notify Dr. Laurance Flatten that the chills have continued.

## 2014-08-13 ENCOUNTER — Other Ambulatory Visit: Payer: Self-pay | Admitting: Family Medicine

## 2014-08-13 MED ORDER — LEVOTHYROXINE SODIUM 50 MCG PO TABS: 50.0000 ug | ORAL_TABLET | Freq: Every day | ORAL | Status: DC

## 2014-08-13 NOTE — Telephone Encounter (Signed)
This is FYI

## 2014-08-16 ENCOUNTER — Telehealth: Payer: Self-pay | Admitting: *Deleted

## 2014-08-16 NOTE — Telephone Encounter (Signed)
-----   Message from Claretta Fraise, MD sent at 08/13/2014  8:23 PM EST ----- Thyroid is underactive. Synthroid sent to pharmacy

## 2014-08-20 NOTE — Telephone Encounter (Signed)
Spoke with family.

## 2014-08-25 ENCOUNTER — Other Ambulatory Visit: Payer: Self-pay | Admitting: Family Medicine

## 2014-09-04 DIAGNOSIS — Z961 Presence of intraocular lens: Secondary | ICD-10-CM | POA: Diagnosis not present

## 2014-09-04 DIAGNOSIS — H43813 Vitreous degeneration, bilateral: Secondary | ICD-10-CM | POA: Diagnosis not present

## 2014-09-04 DIAGNOSIS — H26493 Other secondary cataract, bilateral: Secondary | ICD-10-CM | POA: Diagnosis not present

## 2014-09-11 ENCOUNTER — Other Ambulatory Visit: Payer: Self-pay | Admitting: Pharmacist

## 2014-09-11 MED ORDER — MEMANTINE HCL-DONEPEZIL HCL ER 28-10 MG PO CP24: 1.0000 | ORAL_CAPSULE | Freq: Every evening | ORAL | Status: DC

## 2014-09-13 ENCOUNTER — Other Ambulatory Visit: Payer: Self-pay | Admitting: Pharmacist

## 2014-09-24 ENCOUNTER — Other Ambulatory Visit: Payer: Self-pay | Admitting: *Deleted

## 2014-09-24 MED ORDER — MIRTAZAPINE 30 MG PO TABS: 30.0000 mg | ORAL_TABLET | Freq: Every day | ORAL | Status: DC

## 2014-10-14 ENCOUNTER — Other Ambulatory Visit: Payer: Self-pay | Admitting: Family Medicine

## 2014-10-15 ENCOUNTER — Other Ambulatory Visit: Payer: Self-pay | Admitting: *Deleted

## 2014-10-15 DIAGNOSIS — F329 Major depressive disorder, single episode, unspecified: Secondary | ICD-10-CM

## 2014-10-15 DIAGNOSIS — F32A Depression, unspecified: Secondary | ICD-10-CM

## 2014-10-15 MED ORDER — DULOXETINE HCL 60 MG PO CPEP: 60.0000 mg | ORAL_CAPSULE | Freq: Every day | ORAL | Status: DC

## 2014-10-15 NOTE — Telephone Encounter (Signed)
Last filled 09/05/14 Call into [facility]

## 2014-11-04 ENCOUNTER — Ambulatory Visit (INDEPENDENT_AMBULATORY_CARE_PROVIDER_SITE_OTHER): Payer: Medicare Other | Admitting: Family Medicine

## 2014-11-04 ENCOUNTER — Encounter: Payer: Self-pay | Admitting: Family Medicine

## 2014-11-04 VITALS — BP 136/72 | HR 72 | Temp 96.9°F | Ht 64.0 in | Wt 169.0 lb

## 2014-11-04 DIAGNOSIS — R413 Other amnesia: Secondary | ICD-10-CM | POA: Diagnosis not present

## 2014-11-04 DIAGNOSIS — F329 Major depressive disorder, single episode, unspecified: Secondary | ICD-10-CM

## 2014-11-04 DIAGNOSIS — F0390 Unspecified dementia without behavioral disturbance: Secondary | ICD-10-CM

## 2014-11-04 DIAGNOSIS — W57XXXA Bitten or stung by nonvenomous insect and other nonvenomous arthropods, initial encounter: Secondary | ICD-10-CM

## 2014-11-04 DIAGNOSIS — R630 Anorexia: Secondary | ICD-10-CM

## 2014-11-04 DIAGNOSIS — R109 Unspecified abdominal pain: Secondary | ICD-10-CM

## 2014-11-04 DIAGNOSIS — T148 Other injury of unspecified body region: Secondary | ICD-10-CM | POA: Diagnosis not present

## 2014-11-04 DIAGNOSIS — F32A Depression, unspecified: Secondary | ICD-10-CM

## 2014-11-04 LAB — POCT CBC
Granulocyte percent: 54.6 %G (ref 37–80)
HEMATOCRIT: 38.1 % (ref 37.7–47.9)
Hemoglobin: 11.6 g/dL — AB (ref 12.2–16.2)
LYMPH, POC: 1.9 (ref 0.6–3.4)
MCH, POC: 28.3 pg (ref 27–31.2)
MCHC: 30.6 g/dL — AB (ref 31.8–35.4)
MCV: 92.5 fL (ref 80–97)
MPV: 6.7 fL (ref 0–99.8)
PLATELET COUNT, POC: 429 10*3/uL — AB (ref 142–424)
POC Granulocyte: 3.2 (ref 2–6.9)
POC LYMPH PERCENT: 32.9 %L (ref 10–50)
RBC: 4.12 M/uL (ref 4.04–5.48)
RDW, POC: 14.9 %
WBC: 5.9 10*3/uL (ref 4.6–10.2)

## 2014-11-04 MED ORDER — DOXYCYCLINE HYCLATE 100 MG PO TABS: 100.0000 mg | ORAL_TABLET | Freq: Two times a day (BID) | ORAL | Status: DC

## 2014-11-04 NOTE — Patient Instructions (Addendum)
Always check yourself closely for ticks Try to drink a few less milkshakes to see if this improves your desire to want to eat more Continue to drink Ensure Take antibiotic as directed If you develop a fever, rash or arthralgias please get back in touch with [place] as this could be a sign of [facility] spotted fever We will do the test for this with your lab work today.Tick Bite Information Ticks are insects that attach themselves to the skin and draw blood for food. There are various types of ticks. Common types include wood ticks and deer ticks. Most ticks live in shrubs and grassy areas. Ticks can climb onto your body when you make contact with leaves or grass where the tick is waiting. The most common places on the body for ticks to attach themselves are the scalp, neck, armpits, waist, and groin. Most tick bites are harmless, but sometimes ticks carry germs that cause diseases. These germs can be spread to a person during the tick's feeding process. The chance of a disease spreading through a tick bite depends on:   The type of tick.  Time of year.   How long the tick is attached.   Geographic location.  HOW CAN YOU PREVENT TICK BITES? Take these steps to help prevent tick bites when you are outdoors:  Wear protective clothing. Long sleeves and long pants are best.   Wear white clothes so you can see ticks more easily.  Tuck your pant legs into your socks.   If walking on a trail, stay in the middle of the trail to avoid brushing against bushes.  Avoid walking through areas with long grass.  Put insect repellent on all exposed skin and along boot tops, pant legs, and sleeve cuffs.   Check clothing, hair, and skin repeatedly and before going inside.   Brush off any ticks that are not attached.  Take a shower or bath as soon as possible after being outdoors.  WHAT IS THE PROPER WAY TO REMOVE A TICK? Ticks should be removed as soon as possible to help prevent diseases  caused by tick bites. 1. If latex gloves are available, put them on before trying to remove a tick.  2. Using fine-point tweezers, grasp the tick as close to the skin as possible. You may also use curved forceps or a tick removal tool. Grasp the tick as close to its head as possible. Avoid grasping the tick on its body. 3. Pull gently with steady upward pressure until the tick lets go. Do not twist the tick or jerk it suddenly. This may break off the tick's head or mouth parts. 4. Do not squeeze or crush the tick's body. This could force disease-carrying fluids from the tick into your body.  5. After the tick is removed, wash the bite area and your hands with soap and water or other disinfectant such as alcohol. 6. Apply a small amount of antiseptic cream or ointment to the bite site.  7. Wash and disinfect any instruments that were used.  Do not try to remove a tick by applying a hot match, petroleum jelly, or fingernail polish to the tick. These methods do not work and may increase the chances of disease being spread from the tick bite.  WHEN SHOULD YOU SEEK MEDICAL CARE? Contact your health care provider if you are unable to remove a tick from your skin or if a part of the tick breaks off and is stuck in the skin.  After  a tick bite, you need to be aware of signs and symptoms that could be related to diseases spread by ticks. Contact your health care provider if you develop any of the following in the days or weeks after the tick bite:  Unexplained fever.  Rash. A circular rash that appears days or weeks after the tick bite may indicate the possibility of Lyme disease. The rash may resemble a target with a bull's-eye and may occur at a different part of your body than the tick bite.  Redness and swelling in the area of the tick bite.   Tender, swollen lymph glands.   Diarrhea.   Weight loss.   Cough.   Fatigue.   Muscle, joint, or bone pain.   Abdominal pain.    Headache.   Lethargy or a change in your level of consciousness.  Difficulty walking or moving your legs.   Numbness in the legs.   Paralysis.  Shortness of breath.   Confusion.   Repeated vomiting.  Document Released: 06/25/2000 Document Revised: 04/18/2013 Document Reviewed: 12/06/2012 [facility] Patient Information 2015 [place], [place]. This information is not intended to replace advice given to you by your health care provider. Make sure you discuss any questions you have with your health care provider.

## 2014-11-04 NOTE — Progress Notes (Signed)
Subjective:    Patient ID: Traci Wheeler, female    DOB: Jul 26, 1927, 81 y.o.   MRN: [id number]  HPI Patient here today for her decreased appetite. She also states she has a slight pain / burning in her stomach at times. She is accompanied today by her husband. The patient and her husband are pretty much home bound and did not get out unless they have someone to pick them up and carry them to their visits or running errands. The patient has dementia and her husband has ASCVD. The patient says she does not have any appetite but has gained 7 pounds of weight since January. She is drinking 3 cans of ensure a day and drinking a lot of milk shakes and not eating much more. She denies chest pain or shortness of breath or any specific GI complaints. She has not seen any blood in her stool. She comes to the visit today with her husband who heard her responses to the questions that were asked of her.       Patient Active Problem List   Diagnosis Date Noted  . Malaise and fatigue 10/01/2013  . Hypertension 12/14/2012  . Hyperlipemia 12/14/2012  . Dementia 12/11/2012  . Depression 10/04/2012  . ALLERGIC RHINITIS 07/19/2007  . ASTHMA 07/19/2007  . ESOPHAGEAL REFLUX 07/19/2007   Outpatient Encounter Prescriptions as of 11/04/2014  Medication Sig  . albuterol (PROVENTIL HFA;VENTOLIN HFA) 108 (90 BASE) MCG/ACT inhaler Inhale 2 puffs into the lungs every 6 (six) hours as needed for wheezing.  Marland Kitchen amLODipine (NORVASC) 10 MG tablet TAKE (1) TABLET DAILY IN THE MORNING.  Marland Kitchen buPROPion (WELLBUTRIN XL) 300 MG 24 hr tablet TAKE 1 TABLET ONCE A DAY  . busPIRone (BUSPAR) 15 MG tablet Take 1 tablet (15 mg total) by mouth every morning.  . cholecalciferol (VITAMIN D) 1000 UNITS tablet Take 2,000 Units by mouth daily.  . DULoxetine (CYMBALTA) 60 MG capsule Take 1 capsule (60 mg total) by mouth daily.  Marland Kitchen lamoTRIgine (LAMICTAL) 25 MG tablet Take 1 tablet (25 mg total) by mouth 2 (two) times daily.  Marland Kitchen levothyroxine  (SYNTHROID, LEVOTHROID) 50 MCG tablet Take 1 tablet (50 mcg total) by mouth daily.  Marland Kitchen LORazepam (ATIVAN) 0.5 MG tablet TAKE ONE TABLET AT BEDTIME  . Memantine HCl-Donepezil HCl (NAMZARIC) 28-10 MG CP24 Take 1 capsule by mouth every evening.  . mirtazapine (REMERON) 30 MG tablet Take 1 tablet (30 mg total) by mouth at bedtime.  . pantoprazole (PROTONIX) 40 MG tablet TAKE 1 TABLET ONCE A DAY  . simvastatin (ZOCOR) 20 MG tablet Take 1 tablet (20 mg total) by mouth at bedtime.  . [DISCONTINUED] donepezil (ARICEPT) 10 MG tablet Take 1 tablet (10 mg total) by mouth at bedtime.  . [DISCONTINUED] Memantine HCl ER 28 MG CP24 Take 28 mg by mouth daily.    Review of Systems  Constitutional: Positive for appetite change (decrease).  HENT: Negative.   Eyes: Negative.   Respiratory: Negative.   Cardiovascular: Negative.   Gastrointestinal: Positive for abdominal pain ("burning" ).  Endocrine: Negative.   Genitourinary: Negative.   Musculoskeletal: Negative.   Skin: Negative.   Allergic/Immunologic: Negative.   Neurological: Negative.   Hematological: Negative.   Psychiatric/Behavioral: Negative.        Objective:   Physical Exam  Constitutional: She is oriented to person, place, and time. She appears well-developed and well-nourished. No distress.  HENT:  Head: Normocephalic and atraumatic.  Right Ear: External ear normal.  Left Ear: External ear  normal.  Nose: Nose normal.  Mouth/Throat: Oropharynx is clear and moist. No oropharyngeal exudate.  Eyes: Conjunctivae and EOM are normal. Pupils are equal, round, and reactive to light. Right eye exhibits no discharge. Left eye exhibits no discharge. No scleral icterus.  Neck: Normal range of motion. Neck supple. No thyromegaly present.  No thyromegaly or anterior cervical adenopathy  Cardiovascular: Normal rate, regular rhythm, normal heart sounds and intact distal pulses.   No murmur heard. At 72/m  Pulmonary/Chest: Effort normal and breath  sounds normal. No respiratory distress. She has no wheezes. She has no rales. She exhibits no tenderness.  Clear anteriorly and posteriorly without wheezes or congestion  Abdominal: Soft. Bowel sounds are normal. She exhibits no mass. There is no tenderness. There is no rebound and no guarding.  Musculoskeletal: Normal range of motion. She exhibits no edema.  Lymphadenopathy:    She has no cervical adenopathy.  Neurological: She is alert and oriented to person, place, and time.  She continues to have her memory disturbance but responds appropriately to questions asked of her.  Skin: Skin is warm and dry. No rash noted. There is erythema. No pallor.  There is no rash but there was a tick with surrounding redness attached to the anterior chest wall just beneath the bra line. This was removed successfully and the patient was informed of the tick bite. A second tick was removed from the right iliac crest area also. This was also a dog tick or wood tick.  Psychiatric: She has a normal mood and affect. Her behavior is normal. Thought content normal.  The patient has a memory disturbance. She is aware of this. This seems to be stable with past visits.  Nursing note and vitals reviewed.   BP 136/72 mmHg  Pulse 72  Temp(Src) 96.9 F (36.1 C) (Oral)  Ht 5\' 4"  (1.626 m)  Wt 169 lb (76.658 kg)  BMI 28.99 kg/m2       Assessment & Plan:  1. Abdominal discomfort -Continue with omeprazole and diminish the frequency of milkshakes. Continue with Ensure. - POCT CBC - BMP8+EGFR - Hepatic function panel - Thyroid Panel With TSH  2. Decrease in appetite -We will review lab work and we will ask the patient to reduce her intake of milk and dairy products. - POCT CBC - BMP8+EGFR - Hepatic function panel - Thyroid Panel With TSH  3. Tick bite -2 ticks were removed from the patient today and she will be prescribed doxycycline for this. - [facility] spotted fvr abs pnl(IgG+IgM)  4. Dementia, without  behavioral disturbance -This appears to be stable.  5. Depression -This ask he seems better today.  6. Memory disorder -Her memory disorder continues but appears to be stable.  Meds ordered this encounter  Medications  . doxycycline (VIBRA-TABS) 100 MG tablet    Sig: Take 1 tablet (100 mg total) by mouth 2 (two) times daily.    Dispense:  28 tablet    Refill:  0   Patient Instructions  Always check yourself closely for ticks Try to drink a few less milkshakes to see if this improves your desire to want to eat more Continue to drink Ensure Take antibiotic as directed If you develop a fever, rash or arthralgias please get back in touch with [place] as this could be a sign of [facility] spotted fever We will do the test for this with your lab work today.    Tick bite information was given to the patient.  Arrie Senate MD

## 2014-11-06 LAB — HEPATIC FUNCTION PANEL
ALK PHOS: 102 IU/L (ref 39–117)
ALT: 6 IU/L (ref 0–32)
AST: 16 IU/L (ref 0–40)
Albumin: 4.3 g/dL (ref 3.5–4.7)
Bilirubin Total: 0.2 mg/dL (ref 0.0–1.2)
Bilirubin, Direct: 0.06 mg/dL (ref 0.00–0.40)
TOTAL PROTEIN: 6.4 g/dL (ref 6.0–8.5)

## 2014-11-06 LAB — BMP8+EGFR
BUN / CREAT RATIO: 16 (ref 11–26)
BUN: 19 mg/dL (ref 8–27)
CO2: 28 mmol/L (ref 18–29)
Calcium: 9.7 mg/dL (ref 8.7–10.3)
Chloride: 102 mmol/L (ref 97–108)
Creatinine, Ser: 1.19 mg/dL — ABNORMAL HIGH (ref 0.57–1.00)
GFR, EST AFRICAN AMERICAN: 48 mL/min/{1.73_m2} — AB (ref >59)
GFR, EST NON AFRICAN AMERICAN: 42 mL/min/{1.73_m2} — AB (ref >59)
Glucose: 103 mg/dL — ABNORMAL HIGH (ref 65–99)
POTASSIUM: 4.9 mmol/L (ref 3.5–5.2)
Sodium: 145 mmol/L — ABNORMAL HIGH (ref 134–144)

## 2014-11-06 LAB — THYROID PANEL WITH TSH
FREE THYROXINE INDEX: 2.7 (ref 1.2–4.9)
T3 Uptake Ratio: 28 % (ref 24–39)
T4 TOTAL: 9.7 ug/dL (ref 4.5–12.0)
TSH: 0.393 u[IU]/mL — AB (ref 0.450–4.500)

## 2014-11-06 LAB — RMSF, IGG, IFA

## 2014-11-06 LAB — ROCKY MTN SPOTTED FVR ABS PNL(IGG+IGM)
RMSF IGG: POSITIVE — AB
RMSF IgM: 0.31 index (ref 0.00–0.89)

## 2014-11-18 ENCOUNTER — Other Ambulatory Visit: Payer: Self-pay | Admitting: Family Medicine

## 2014-11-18 ENCOUNTER — Encounter: Payer: Self-pay | Admitting: Family Medicine

## 2014-11-18 ENCOUNTER — Ambulatory Visit (INDEPENDENT_AMBULATORY_CARE_PROVIDER_SITE_OTHER): Payer: Medicare Other | Admitting: Family Medicine

## 2014-11-18 VITALS — BP 153/68 | HR 77 | Temp 97.0°F | Ht 64.0 in | Wt 165.0 lb

## 2014-11-18 DIAGNOSIS — W57XXXA Bitten or stung by nonvenomous insect and other nonvenomous arthropods, initial encounter: Secondary | ICD-10-CM

## 2014-11-18 DIAGNOSIS — F0390 Unspecified dementia without behavioral disturbance: Secondary | ICD-10-CM | POA: Diagnosis not present

## 2014-11-18 DIAGNOSIS — R1033 Periumbilical pain: Secondary | ICD-10-CM | POA: Diagnosis not present

## 2014-11-18 DIAGNOSIS — T148 Other injury of unspecified body region: Secondary | ICD-10-CM

## 2014-11-18 DIAGNOSIS — R05 Cough: Secondary | ICD-10-CM | POA: Diagnosis not present

## 2014-11-18 DIAGNOSIS — R059 Cough, unspecified: Secondary | ICD-10-CM

## 2014-11-18 DIAGNOSIS — A770 Spotted fever due to Rickettsia rickettsii: Secondary | ICD-10-CM

## 2014-11-18 DIAGNOSIS — R630 Anorexia: Secondary | ICD-10-CM

## 2014-11-18 NOTE — Progress Notes (Signed)
Subjective:    Patient ID: Traci Wheeler, female    DOB: 05/09/1928, 81 y.o.   MRN: [id number]  HPI Pt here for follow up on recent tick bites and appetite loss. She also complains of a slight cough with greenish colored drainage. She is accompanied today by her husband. On the most recent visit on April 25 the patient was seen and started on doxycycline because of to ticks that had to be removed during the office visit. The tests for [facility] spotted fever came back with a titer that was suggestive of a recent or ongoing infection. The patient was given 2 weeks worth of doxycycline to take. Today her weight is down 4 pounds from what it was before.     Patient Active Problem List   Diagnosis Date Noted  . Malaise and fatigue 10/01/2013  . Hypertension 12/14/2012  . Hyperlipemia 12/14/2012  . Dementia 12/11/2012  . Depression 10/04/2012  . ALLERGIC RHINITIS 07/19/2007  . ASTHMA 07/19/2007  . ESOPHAGEAL REFLUX 07/19/2007   Outpatient Encounter Prescriptions as of 11/18/2014  Medication Sig  . albuterol (PROVENTIL HFA;VENTOLIN HFA) 108 (90 BASE) MCG/ACT inhaler Inhale 2 puffs into the lungs every 6 (six) hours as needed for wheezing.  Marland Kitchen amLODipine (NORVASC) 10 MG tablet TAKE (1) TABLET DAILY IN THE MORNING.  Marland Kitchen buPROPion (WELLBUTRIN XL) 300 MG 24 hr tablet TAKE 1 TABLET ONCE A DAY  . busPIRone (BUSPAR) 15 MG tablet Take 1 tablet (15 mg total) by mouth every morning.  . cholecalciferol (VITAMIN D) 1000 UNITS tablet Take 2,000 Units by mouth daily.  . DULoxetine (CYMBALTA) 60 MG capsule Take 1 capsule (60 mg total) by mouth daily.  Marland Kitchen lamoTRIgine (LAMICTAL) 25 MG tablet Take 1 tablet (25 mg total) by mouth 2 (two) times daily.  Marland Kitchen levothyroxine (SYNTHROID, LEVOTHROID) 50 MCG tablet Take 1 tablet (50 mcg total) by mouth daily.  Marland Kitchen LORazepam (ATIVAN) 0.5 MG tablet TAKE ONE TABLET AT BEDTIME  . Memantine HCl-Donepezil HCl (NAMZARIC) 28-10 MG CP24 Take 1 capsule by mouth every evening.    . mirtazapine (REMERON) 30 MG tablet Take 1 tablet (30 mg total) by mouth at bedtime.  . pantoprazole (PROTONIX) 40 MG tablet TAKE 1 TABLET ONCE A DAY  . simvastatin (ZOCOR) 20 MG tablet Take 1 tablet (20 mg total) by mouth at bedtime.  . [DISCONTINUED] doxycycline (VIBRA-TABS) 100 MG tablet Take 1 tablet (100 mg total) by mouth 2 (two) times daily.   No facility-administered encounter medications on file as of 11/18/2014.     Review of Systems  Constitutional: Negative.   HENT: Negative.   Eyes: Negative.   Respiratory: Negative.   Cardiovascular: Negative.   Gastrointestinal: Negative.   Endocrine: Negative.   Genitourinary: Negative.   Musculoskeletal: Negative.   Skin: Negative.   Allergic/Immunologic: Negative.   Neurological: Negative.   Hematological: Negative.   Psychiatric/Behavioral: Negative.        Objective:   Physical Exam  Constitutional: She is oriented to person, place, and time. She appears well-developed and well-nourished. She appears distressed.  The patient remains distressed as usual regarding her memory disorder and her complaints with not wanting to eat.  HENT:  Head: Normocephalic and atraumatic.  Right Ear: External ear normal.  Left Ear: External ear normal.  Nose: Nose normal.  Mouth/Throat: Oropharynx is clear and moist.  Eyes: Conjunctivae and EOM are normal. Pupils are equal, round, and reactive to light. Right eye exhibits no discharge. Left eye exhibits no discharge. No  scleral icterus.  Neck: Normal range of motion. Neck supple. No thyromegaly present.  No anterior cervical adenopathy or thyromegaly  Cardiovascular: Normal rate, regular rhythm and normal heart sounds.   No murmur heard. At 72/m  Pulmonary/Chest: Effort normal and breath sounds normal. No respiratory distress. She has no wheezes. She has no rales.  No wheezes or rhonchi audible  Abdominal: Soft. Bowel sounds are normal. She exhibits no mass. There is no tenderness. There  is no rebound and no guarding.  Large umbilical hernia and no specific abdominal tenderness to palpation  Musculoskeletal: Normal range of motion. She exhibits no edema.  Lymphadenopathy:    She has no cervical adenopathy.  Neurological: She is alert and oriented to person, place, and time.  Skin: Skin is warm and dry. No rash noted. There is erythema.  The former tick bite sites remain somewhat red especially the one on her side and one on the anterior chest wall appears to be well healed  Psychiatric: She has a normal mood and affect. Her behavior is normal.  The patient continues to have decreased memory. She is somewhat tearful at times because she is unable to express herself and remember what she needs to remember regarding her health. She lives at home with her husband and they have no children. Her life is made better by the 2 living together but is still far from ideal. The pharmacy brings medications to the couple and they take the medications that are brought to them. She has had multiple workups including ultrasounds and CT scans as well as visits to the gastroenterologist and nothing specific has been found regarding her GI complaints.  Nursing note and vitals reviewed.  BP 153/68 mmHg  Pulse 77  Temp(Src) 97 F (36.1 C) (Oral)  Ht 5\' 4"  (1.626 m)  Wt 165 lb (74.844 kg)  BMI 28.31 kg/m2        Assessment & Plan:  1. [facility] spotted fever -The test results were explained to the patient today and she was reminded that she needed to finish taking the antibiotic that had been brought her by the drugstore. She is not familiar with the antibiotic. She takes what is brought to her until it is completed.  2. Periumbilical abdominal pain -The umbilical hernia is reducible and this is not a problem with her discomfort.  3. Dementia, without behavioral disturbance -The dementia appears stable and she worries about this constantly but in the same breath is aware that she is  very fortunate that she and her husband are able to stay at home and they have people attending to their needs with bringing medicine and bringing foods. He is no longer able to drive.  4. Tick bite -The tick bite on the abdomen appears to be healing one on the side is still somewhat red and irritated.  5. Decrease in appetite -I am not really sure about this decrease in appetite. We believe that some of the appetite issues have to do with milkshakes and nutritional supplements. She is eating fresh fruits and fresh festivals. We've encouraged her to eat less ice cream and to take less nutritional supplements and then her appetite for other foods may improve. I am not sure that she is able to understand this or remember this.  6. Cough -The chest was clear but we will encourage her to take Mucinex maximum strength plain, blue and white in color one twice daily for cough and congestion  Patient Instructions  Work with diet  as discussed Continue to drink plenty of fluids eat fresh fruit and vegetables Take medication as delivered to you by the pharmacy Don't worry about what's there just take with a bring to you and finish it Try to drink a little less of the nutritional supplements and not too many milkshakes We have had multiple studies and visits with the specialist as well as CT scans and we have not been able to find anything specific that we can correct about the abdominal pain Stay as active as possible and after being in the yard please check yourself closely for ticks Take Mucinex maximum strength to help cough and congestion Make sure that doxycycline is completed because of the positive diagnosis of [facility] spotted fever   Arrie Senate MD

## 2014-11-18 NOTE — Patient Instructions (Signed)
Work with diet as discussed Continue to drink plenty of fluids eat fresh fruit and vegetables Take medication as delivered to you by the pharmacy Don't worry about what's there just take with a bring to you and finish it Try to drink a little less of the nutritional supplements and not too many milkshakes We have had multiple studies and visits with the specialist as well as CT scans and we have not been able to find anything specific that we can correct about the abdominal pain Stay as active as possible and after being in the yard please check yourself closely for ticks Take Mucinex maximum strength to help cough and congestion Make sure that doxycycline is completed because of the positive diagnosis of [facility] spotted fever

## 2014-12-02 ENCOUNTER — Other Ambulatory Visit: Payer: Self-pay | Admitting: *Deleted

## 2014-12-02 ENCOUNTER — Telehealth: Payer: Self-pay | Admitting: Family Medicine

## 2014-12-02 DIAGNOSIS — G8929 Other chronic pain: Secondary | ICD-10-CM

## 2014-12-02 DIAGNOSIS — R109 Unspecified abdominal pain: Principal | ICD-10-CM

## 2014-12-02 NOTE — Telephone Encounter (Signed)
Pt aware that we are doing a referral to gi

## 2014-12-04 ENCOUNTER — Encounter (INDEPENDENT_AMBULATORY_CARE_PROVIDER_SITE_OTHER): Payer: Self-pay | Admitting: *Deleted

## 2014-12-10 ENCOUNTER — Other Ambulatory Visit: Payer: Self-pay | Admitting: Family Medicine

## 2014-12-10 NOTE — Telephone Encounter (Signed)
Last filled 11/11/14, last seen 11/18/14. Call into [facility]

## 2014-12-23 ENCOUNTER — Other Ambulatory Visit: Payer: Self-pay | Admitting: Family Medicine

## 2014-12-23 ENCOUNTER — Other Ambulatory Visit: Payer: Self-pay | Admitting: Pharmacist

## 2015-01-02 ENCOUNTER — Telehealth: Payer: Self-pay | Admitting: Family Medicine

## 2015-01-02 ENCOUNTER — Other Ambulatory Visit: Payer: Self-pay | Admitting: Family Medicine

## 2015-01-02 NOTE — Telephone Encounter (Signed)
Spoke to Traci Wheeler and he requested the visit notes be faxed over from the visit of 11/03/14. Visit notes were faxed.

## 2015-01-06 ENCOUNTER — Encounter (INDEPENDENT_AMBULATORY_CARE_PROVIDER_SITE_OTHER): Payer: Self-pay | Admitting: Internal Medicine

## 2015-01-06 ENCOUNTER — Ambulatory Visit (INDEPENDENT_AMBULATORY_CARE_PROVIDER_SITE_OTHER): Payer: Medicare Other | Admitting: Internal Medicine

## 2015-01-06 VITALS — BP 142/56 | HR 64 | Temp 97.0°F | Ht 64.0 in | Wt 171.6 lb

## 2015-01-06 DIAGNOSIS — R630 Anorexia: Secondary | ICD-10-CM

## 2015-01-06 NOTE — Progress Notes (Signed)
Subjective:    Patient ID: Traci Wheeler, female    DOB: 02-03-1928, 81 y.o.   MRN: [id number]  HPI Referred to our office for no appetite.  Patient has a hx of dementia and depression. Caregiver states patient sits around.  She bring a list in today. She eats apples, organes, ice cream, oatmeal, Chetos, and drinks Equate Nutritional Shake.  She has gained 6 pounds since May.  She has a caregiver comes in and fixes supper for her and her husband. Patient has hx of dementia. There is not pain actually. She has BM daily. No melena or BRRB. Recently treated with RMSF with Doxycyline in April   04/30/2014 CT abdomen/pelvis with CM:   CLINICAL DATA: Nausea, not eating or drinking, malleolus, fatigue,  gas, 15 lb weight loss, elevated lipase  EXAM: CT ABDOMEN AND PELVIS WITHOUT CONTRAST IMPRESSION: 1. No acute abdominal or pelvic pathology. 2. Nonobstructing left nephrolithiasis. 3. Back containing umbilical hernia.   Hepatic Function Panel     Component Value Date/Time   PROT 6.4 11/04/2014 1206   PROT 7.7 04/24/2014 1525   ALBUMIN 4.3 04/24/2014 1525   AST 16 11/04/2014 1206   ALT 6 11/04/2014 1206   ALKPHOS 102 11/04/2014 1206   BILITOT 0.2 11/04/2014 1206   BILITOT 0.3 08/08/2014 1554   BILIDIR 0.06 11/04/2014 1206   BILIDIR 0.13 05/07/2014 1419   IBILI 0.3 10/26/2013 1410      CBC    Component Value Date/Time   WBC 5.9 11/04/2014 1213   WBC 8.5 08/08/2014 1554   WBC 7.7 04/24/2014 1523   RBC 4.12 11/04/2014 1213   RBC 4.14 08/08/2014 1554   RBC 4.82 04/24/2014 1523   HGB 11.6* 11/04/2014 1213   HGB 12.7 08/08/2014 1554   HCT 38.1 11/04/2014 1213   HCT 38.4 08/08/2014 1554   PLT 464* 08/08/2014 1554   MCV 92.5 11/04/2014 1213   MCV 93 08/08/2014 1554   MCH 28.3 11/04/2014 1213   MCH 30.7 08/08/2014 1554   MCH 30.9 04/24/2014 1523   MCHC 30.6* 11/04/2014 1213   MCHC 33.1 08/08/2014 1554   MCHC 33.2 04/24/2014 1523   RDW 13.7 08/08/2014 1554   RDW 14.1 04/24/2014 1523   LYMPHSABS 2.0 08/08/2014 1554   LYMPHSABS 2.2 04/24/2014 1523   MONOABS 0.7 04/24/2014 1523   EOSABS 0.2 08/08/2014 1554   EOSABS 0.1 04/24/2014 1523   BASOSABS 0.0 08/08/2014 1554   BASOSABS 0.1 04/24/2014 1523         Review of Systems     Past Medical History  Diagnosis Date  . Hypertension   . Asthma   . Postmenopausal HRT (hormone replacement therapy)   . Depression   . Hyperlipidemia   . GERD (gastroesophageal reflux disease)   . Colon polyps   . RBBB (right bundle branch block)   . Gastric polyps   . Fibromyalgia   . Hiatal hernia   . Dementia     Past Surgical History  Procedure Laterality Date  . Lumbar spine surgery      Disc    Allergies  Allergen Reactions  . Aspirin Nausea And Vomiting  . Codeine Phosphate     REACTION: unspecified  . Darvocet [Propoxyphene N-Acetaminophen]   . Glucosamine Forte [Nutritional Supplements]   . Naproxen     REACTION: unspecified  . Nsaids   . Sulfamethoxazole     REACTION: unspecified  . Sulfur Dioxide Itching    Current Outpatient Prescriptions on File Prior to  Visit  Medication Sig Dispense Refill  . albuterol (PROVENTIL HFA;VENTOLIN HFA) 108 (90 BASE) MCG/ACT inhaler Inhale 2 puffs into the lungs every 6 (six) hours as needed for wheezing. 1 Inhaler 2  . amLODipine (NORVASC) 10 MG tablet TAKE (1) TABLET DAILY IN THE MORNING. 30 tablet 5  . buPROPion (WELLBUTRIN XL) 300 MG 24 hr tablet TAKE 1 TABLET ONCE A DAY 30 tablet 1  . busPIRone (BUSPAR) 15 MG tablet Take 1 tablet (15 mg total) by mouth every morning. 30 tablet 3  . cholecalciferol (VITAMIN D) 1000 UNITS tablet Take 2,000 Units by mouth daily.    . DULoxetine (CYMBALTA) 60 MG capsule TAKE (1) CAPSULE DAILY 30 capsule 1  . lamoTRIgine (LAMICTAL) 25 MG tablet Take 1 tablet (25 mg total) by mouth 2 (two) times daily. 60 tablet 5  . levothyroxine (SYNTHROID, LEVOTHROID) 50 MCG tablet Take 1 tablet (50 mcg total) by mouth daily.  90 tablet 3  . LORazepam (ATIVAN) 0.5 MG tablet TAKE ONE TABLET AT BEDTIME 30 tablet 2  . Memantine HCl-Donepezil HCl (NAMZARIC) 28-10 MG CP24 Take 1 capsule by mouth every evening. 28 capsule 0  . mirtazapine (REMERON) 30 MG tablet TAKE ONE TABLET AT BEDTIME 30 tablet 4  . pantoprazole (PROTONIX) 40 MG tablet TAKE 1 TABLET ONCE A DAY 30 tablet 4  . simvastatin (ZOCOR) 20 MG tablet Take 1 tablet (20 mg total) by mouth at bedtime. 30 tablet 4   No current facility-administered medications on file prior to visit.   Married no children  Objective:   Physical Exam Blood pressure 142/56, pulse 64, temperature 97 F (36.1 C), height 5\' 4"  (1.626 m), weight 171 lb 9.6 oz (77.837 kg). Alert and oriented. Skin warm and dry. Oral mucosa is moist.   . Sclera anicteric, conjunctivae is pink. Thyroid not enlarged. No cervical lymphadenopathy. Lungs clear. Heart regular rate and rhythm.  Abdomen is soft. Bowel sounds are positive. No hepatomegaly. No abdominal masses felt. No tenderness.  No edema to lower extremities.        Assessment & Plan:  Loss of appetite. Patient has gained 5 pounds since May. Am going to bring her back in 6 weeks and she will keep a dairy x 4 weeks.

## 2015-01-06 NOTE — Patient Instructions (Signed)
Keep diary of what you eat daily x 4 weeks.  OV 6 weeks.

## 2015-01-08 ENCOUNTER — Other Ambulatory Visit: Payer: Self-pay | Admitting: Pharmacist

## 2015-01-08 MED ORDER — MEMANTINE HCL-DONEPEZIL HCL ER 28-10 MG PO CP24: 1.0000 | ORAL_CAPSULE | Freq: Every evening | ORAL | Status: DC

## 2015-01-10 ENCOUNTER — Other Ambulatory Visit: Payer: Self-pay | Admitting: Family Medicine

## 2015-01-27 ENCOUNTER — Telehealth: Payer: Self-pay | Admitting: Family Medicine

## 2015-01-27 NOTE — Telephone Encounter (Signed)
Patient states she will continue with her current diet and if she needs anything else she will give [place] a call.

## 2015-02-10 ENCOUNTER — Other Ambulatory Visit: Payer: Self-pay | Admitting: Family Medicine

## 2015-02-10 NOTE — Telephone Encounter (Signed)
Please call in ativan with 1 refills 

## 2015-02-10 NOTE — Telephone Encounter (Signed)
Last filled 01/10/15, last seen 11/18/14. DWM pt. Call in at [place]

## 2015-02-10 NOTE — Telephone Encounter (Signed)
Rx called in 

## 2015-02-14 ENCOUNTER — Ambulatory Visit: Payer: Self-pay

## 2015-02-17 ENCOUNTER — Ambulatory Visit (INDEPENDENT_AMBULATORY_CARE_PROVIDER_SITE_OTHER): Payer: Medicare Other | Admitting: Internal Medicine

## 2015-02-18 ENCOUNTER — Ambulatory Visit (INDEPENDENT_AMBULATORY_CARE_PROVIDER_SITE_OTHER): Payer: Medicare Other | Admitting: Family Medicine

## 2015-02-18 ENCOUNTER — Encounter: Payer: Self-pay | Admitting: Family Medicine

## 2015-02-18 ENCOUNTER — Ambulatory Visit: Payer: Medicare Other | Admitting: Family Medicine

## 2015-02-18 VITALS — BP 169/74 | HR 78 | Temp 97.5°F | Ht 64.0 in | Wt 171.0 lb

## 2015-02-18 DIAGNOSIS — R54 Age-related physical debility: Secondary | ICD-10-CM | POA: Diagnosis not present

## 2015-02-18 DIAGNOSIS — E785 Hyperlipidemia, unspecified: Secondary | ICD-10-CM | POA: Diagnosis not present

## 2015-02-18 DIAGNOSIS — R413 Other amnesia: Secondary | ICD-10-CM | POA: Diagnosis not present

## 2015-02-18 DIAGNOSIS — I10 Essential (primary) hypertension: Secondary | ICD-10-CM | POA: Diagnosis not present

## 2015-02-18 DIAGNOSIS — E559 Vitamin D deficiency, unspecified: Secondary | ICD-10-CM | POA: Diagnosis not present

## 2015-02-18 NOTE — Patient Instructions (Addendum)
Medicare Annual Wellness Visit  [facility] and the medical providers at [facility] strive to bring you the best medical care.  In doing so we not only want to address your current medical conditions and concerns but also to detect new conditions early and prevent illness, disease and health-related problems.    Medicare offers a yearly Wellness Visit which allows our clinical staff to assess your need for preventative services including immunizations, lifestyle education, counseling to decrease risk of preventable diseases and screening for fall risk and other medical concerns.    This visit is provided free of charge (no copay) for all Medicare recipients. The clinical pharmacists at [facility] have begun to conduct these Wellness Visits which will also include a thorough review of all your medications.    As you primary medical provider recommend that you make an appointment for your Annual Wellness Visit if you have not done so already this year.  You may set up this appointment before you leave today or you may call back ([phone]) and schedule an appointment.  Please make sure when you call that you mention that you are scheduling your Annual Wellness Visit with the clinical pharmacist so that the appointment may be made for the proper length of time.     Continue current medications. Continue good therapeutic lifestyle changes which include good diet and exercise. Fall precautions discussed with patient. If an FOBT was given today- please return it to our front desk. If you are over 98 years old - you may need Prevnar 25 or the adult Pneumonia vaccine.   After your visit with [place] today you will receive a survey in the mail or online from [employer] regarding your care with [place]. Please take a moment to fill this out. Your feedback is very important to [place] as you can help [place] better understand your patient needs as well as  improve your experience and satisfaction. WE CARE ABOUT YOU!!!   The patient should continue with her current nutritional supplements She should continue to be careful and did not put yourself at risk for falling She should get out and be active as much as possible with friends and caregivers

## 2015-02-18 NOTE — Progress Notes (Signed)
Subjective:    Patient ID: Traci Wheeler, female    DOB: 1928-05-10, 81 y.o.   MRN: [id number]  HPI Pt here for follow up and management of chronic medical problems which includes hypertension and hyperlipidemia. She is taking medications regularly. The patient comes to the visit today with her husband and with her driver. She still complains of decreased appetite. She'll be given an FOBT to return and will get traditional lab work today. Her weight is about the same and has not changed any despite the decreased appetite.     Patient Active Problem List   Diagnosis Date Noted  . Malaise and fatigue 10/01/2013  . Hypertension 12/14/2012  . Hyperlipemia 12/14/2012  . Dementia 12/11/2012  . Depression 10/04/2012  . ALLERGIC RHINITIS 07/19/2007  . ASTHMA 07/19/2007  . ESOPHAGEAL REFLUX 07/19/2007   Outpatient Encounter Prescriptions as of 02/18/2015  Medication Sig  . albuterol (PROVENTIL HFA;VENTOLIN HFA) 108 (90 BASE) MCG/ACT inhaler Inhale 2 puffs into the lungs every 6 (six) hours as needed for wheezing.  Marland Kitchen amLODipine (NORVASC) 10 MG tablet TAKE (1) TABLET DAILY IN THE MORNING.  Marland Kitchen buPROPion (WELLBUTRIN XL) 300 MG 24 hr tablet TAKE 1 TABLET ONCE A DAY  . busPIRone (BUSPAR) 15 MG tablet Take 1 tablet (15 mg total) by mouth every morning.  . cholecalciferol (VITAMIN D) 1000 UNITS tablet Take 2,000 Units by mouth daily.  . DULoxetine (CYMBALTA) 60 MG capsule TAKE (1) CAPSULE DAILY  . lamoTRIgine (LAMICTAL) 25 MG tablet Take 1 tablet (25 mg total) by mouth 2 (two) times daily.  Marland Kitchen levothyroxine (SYNTHROID, LEVOTHROID) 50 MCG tablet Take 1 tablet (50 mcg total) by mouth daily.  Marland Kitchen LORazepam (ATIVAN) 0.5 MG tablet TAKE ONE TABLET AT BEDTIME  . Memantine HCl-Donepezil HCl (NAMZARIC) 28-10 MG CP24 Take 1 capsule by mouth every evening.  . mirtazapine (REMERON) 30 MG tablet TAKE ONE TABLET AT BEDTIME  . pantoprazole (PROTONIX) 40 MG tablet TAKE 1 TABLET ONCE A DAY  . simvastatin (ZOCOR) 20  MG tablet Take 1 tablet (20 mg total) by mouth at bedtime.   No facility-administered encounter medications on file as of 02/18/2015.      Review of Systems  Constitutional: Positive for appetite change (still no appetites).  HENT: Negative.   Eyes: Negative.   Respiratory: Negative.   Cardiovascular: Negative.   Gastrointestinal: Negative.   Endocrine: Negative.   Genitourinary: Negative.   Musculoskeletal: Negative.   Skin: Negative.   Allergic/Immunologic: Negative.   Neurological: Negative.   Hematological: Negative.   Psychiatric/Behavioral: Negative.        Objective:   Physical Exam  Constitutional: She is oriented to person, place, and time. She appears well-developed and well-nourished. No distress.  The patient has good communication skills today and understands our conversation well. She does have some memory deficits but overall appears to be stable over the past several months and in better spirits  HENT:  Head: Normocephalic and atraumatic.  Right Ear: External ear normal.  Left Ear: External ear normal.  Nose: Nose normal.  Mouth/Throat: Oropharynx is clear and moist. No oropharyngeal exudate.  Eyes: Conjunctivae and EOM are normal. Pupils are equal, round, and reactive to light. Right eye exhibits no discharge. Left eye exhibits no discharge. No scleral icterus.  Neck: Normal range of motion. Neck supple. No thyromegaly present.  No carotid bruits or thyromegaly  Cardiovascular: Normal rate, regular rhythm, normal heart sounds and intact distal pulses.  Exam reveals no gallop and no friction rub.  No murmur heard. Heart is regular at 72/m  Pulmonary/Chest: Effort normal and breath sounds normal. No respiratory distress. She has no wheezes. She has no rales. She exhibits no tenderness.  Lungs remain clear without wheezing or chest congestion  Abdominal: Soft. Bowel sounds are normal. She exhibits no mass. There is no tenderness. There is no rebound and no  guarding.  The patient continues to have a large umbilical hernia and today there is no tenderness in the abdomen or organ enlargement or masses.  Musculoskeletal: Normal range of motion. She exhibits no edema or tenderness.  She has good movement of upper and lower extremities  Lymphadenopathy:    She has no cervical adenopathy.  Neurological: She is alert and oriented to person, place, and time. She has normal reflexes. No cranial nerve deficit.  The patient's memory deficits remain stable and appeared to be more short-term than long-term. She continues to be focused on nutritional issues and has been reassured by the gastroenterologist that her GI tract is good and that her nutritional issues and concerns are most likely related to her dementia.  Skin: Skin is warm and dry. No rash noted.  Psychiatric: She has a normal mood and affect. Her behavior is normal. Judgment and thought content normal.  Nursing note and vitals reviewed.  BP 179/76 mmHg  Pulse 77  Temp(Src) 97.5 F (36.4 C) (Oral)  Ht 5' 4"  (1.626 m)  Wt 171 lb (77.565 kg)  BMI 29.34 kg/m2        Assessment & Plan:  1. Memory disorder -Overall this appears to be stable and the patient appears to be in good spirits - CBC with Differential/Platelet  2. Frailty -Her physical stamina seems to be stable and also her emotional stamina is stable. - CBC with Differential/Platelet  3. Hyperlipemia -She will continue with his simvastatin pending results of lab work - Lipid panel - CBC with Differential/Platelet  4. Essential hypertension -Her blood pressure systolically was elevated but there will be no change in treatment - BMP8+EGFR - Hepatic function panel - CBC with Differential/Platelet  5. Vitamin D deficiency -Continue current vitamin D replacement pending results of lab work - Vit D  25 hydroxy (rtn osteoporosis monitoring) - CBC with Differential/Platelet  Patient Instructions                        Medicare Annual Wellness Visit  [facility] and the medical providers at [facility] strive to bring you the best medical care.  In doing so we not only want to address your current medical conditions and concerns but also to detect new conditions early and prevent illness, disease and health-related problems.    Medicare offers a yearly Wellness Visit which allows our clinical staff to assess your need for preventative services including immunizations, lifestyle education, counseling to decrease risk of preventable diseases and screening for fall risk and other medical concerns.    This visit is provided free of charge (no copay) for all Medicare recipients. The clinical pharmacists at [facility] have begun to conduct these Wellness Visits which will also include a thorough review of all your medications.    As you primary medical provider recommend that you make an appointment for your Annual Wellness Visit if you have not done so already this year.  You may set up this appointment before you leave today or you may call back ([phone]) and schedule an appointment.  Please make sure  when you call that you mention that you are scheduling your Annual Wellness Visit with the clinical pharmacist so that the appointment may be made for the proper length of time.     Continue current medications. Continue good therapeutic lifestyle changes which include good diet and exercise. Fall precautions discussed with patient. If an FOBT was given today- please return it to our front desk. If you are over 4 years old - you may need Prevnar 80 or the adult Pneumonia vaccine.   After your visit with [place] today you will receive a survey in the mail or online from [employer] regarding your care with [place]. Please take a moment to fill this out. Your feedback is very important to [place] as you can help [place] better understand your patient needs as well as improve your experience  and satisfaction. WE CARE ABOUT YOU!!!   The patient should continue with her current nutritional supplements She should continue to be careful and did not put yourself at risk for falling She should get out and be active as much as possible with friends and caregivers   Arrie Senate MD

## 2015-02-19 ENCOUNTER — Other Ambulatory Visit: Payer: Self-pay | Admitting: Family Medicine

## 2015-02-19 LAB — CBC WITH DIFFERENTIAL/PLATELET
Basophils Absolute: 0.1 10*3/uL (ref 0.0–0.2)
Basos: 1 %
EOS (ABSOLUTE): 0.2 10*3/uL (ref 0.0–0.4)
Eos: 4 %
HEMATOCRIT: 38 % (ref 34.0–46.6)
Hemoglobin: 12.5 g/dL (ref 11.1–15.9)
IMMATURE GRANS (ABS): 0 10*3/uL (ref 0.0–0.1)
Immature Granulocytes: 0 %
LYMPHS: 31 %
Lymphocytes Absolute: 2 10*3/uL (ref 0.7–3.1)
MCH: 30 pg (ref 26.6–33.0)
MCHC: 32.9 g/dL (ref 31.5–35.7)
MCV: 91 fL (ref 79–97)
Monocytes Absolute: 0.8 10*3/uL (ref 0.1–0.9)
Monocytes: 12 %
NEUTROS ABS: 3.4 10*3/uL (ref 1.4–7.0)
NEUTROS PCT: 52 %
PLATELETS: 424 10*3/uL — AB (ref 150–379)
RBC: 4.17 x10E6/uL (ref 3.77–5.28)
RDW: 14.3 % (ref 12.3–15.4)
WBC: 6.5 10*3/uL (ref 3.4–10.8)

## 2015-02-19 LAB — BMP8+EGFR
BUN / CREAT RATIO: 10 — AB (ref 11–26)
BUN: 11 mg/dL (ref 8–27)
CO2: 27 mmol/L (ref 18–29)
CREATININE: 1.09 mg/dL — AB (ref 0.57–1.00)
Calcium: 9.9 mg/dL (ref 8.7–10.3)
Chloride: 101 mmol/L (ref 97–108)
GFR calc non Af Amer: 47 mL/min/{1.73_m2} — ABNORMAL LOW (ref >59)
GFR, EST AFRICAN AMERICAN: 54 mL/min/{1.73_m2} — AB (ref >59)
Glucose: 86 mg/dL (ref 65–99)
Potassium: 4.7 mmol/L (ref 3.5–5.2)
Sodium: 144 mmol/L (ref 134–144)

## 2015-02-19 LAB — LIPID PANEL
Chol/HDL Ratio: 2.7 ratio units (ref 0.0–4.4)
Cholesterol, Total: 157 mg/dL (ref 100–199)
HDL: 59 mg/dL (ref >39)
LDL Calculated: 54 mg/dL (ref 0–99)
Triglycerides: 220 mg/dL — ABNORMAL HIGH (ref 0–149)
VLDL Cholesterol Cal: 44 mg/dL — ABNORMAL HIGH (ref 5–40)

## 2015-02-19 LAB — HEPATIC FUNCTION PANEL
ALT: 10 IU/L (ref 0–32)
AST: 19 IU/L (ref 0–40)
Albumin: 4.6 g/dL (ref 3.5–4.7)
Alkaline Phosphatase: 95 IU/L (ref 39–117)
BILIRUBIN TOTAL: 0.3 mg/dL (ref 0.0–1.2)
BILIRUBIN, DIRECT: 0.12 mg/dL (ref 0.00–0.40)
Total Protein: 7.1 g/dL (ref 6.0–8.5)

## 2015-02-19 LAB — VITAMIN D 25 HYDROXY (VIT D DEFICIENCY, FRACTURES): VIT D 25 HYDROXY: 31.6 ng/mL (ref 30.0–100.0)

## 2015-02-20 ENCOUNTER — Encounter: Payer: Self-pay | Admitting: Family Medicine

## 2015-03-07 ENCOUNTER — Other Ambulatory Visit: Payer: Self-pay | Admitting: Family Medicine

## 2015-03-11 ENCOUNTER — Other Ambulatory Visit: Payer: Self-pay | Admitting: Nurse Practitioner

## 2015-03-11 ENCOUNTER — Other Ambulatory Visit: Payer: Self-pay | Admitting: Family Medicine

## 2015-03-11 NOTE — Telephone Encounter (Signed)
Last filled 02/10/15, last seen 02/18/15. Call in at [facility]

## 2015-03-16 ENCOUNTER — Encounter (HOSPITAL_COMMUNITY): Payer: Self-pay | Admitting: *Deleted

## 2015-03-16 ENCOUNTER — Emergency Department (HOSPITAL_COMMUNITY)
Admission: EM | Admit: 2015-03-16 | Discharge: 2015-03-17 | Disposition: A | Discharge status: Home / Self Care | Payer: Medicare Other | Attending: Emergency Medicine | Admitting: Emergency Medicine

## 2015-03-16 DIAGNOSIS — M797 Fibromyalgia: Secondary | ICD-10-CM | POA: Diagnosis not present

## 2015-03-16 DIAGNOSIS — F0390 Unspecified dementia without behavioral disturbance: Secondary | ICD-10-CM | POA: Diagnosis not present

## 2015-03-16 DIAGNOSIS — E785 Hyperlipidemia, unspecified: Secondary | ICD-10-CM | POA: Insufficient documentation

## 2015-03-16 DIAGNOSIS — Z79899 Other long term (current) drug therapy: Secondary | ICD-10-CM | POA: Diagnosis not present

## 2015-03-16 DIAGNOSIS — K219 Gastro-esophageal reflux disease without esophagitis: Secondary | ICD-10-CM | POA: Diagnosis not present

## 2015-03-16 DIAGNOSIS — R112 Nausea with vomiting, unspecified: Secondary | ICD-10-CM | POA: Diagnosis not present

## 2015-03-16 DIAGNOSIS — R55 Syncope and collapse: Secondary | ICD-10-CM | POA: Diagnosis present

## 2015-03-16 DIAGNOSIS — J45909 Unspecified asthma, uncomplicated: Secondary | ICD-10-CM | POA: Diagnosis not present

## 2015-03-16 DIAGNOSIS — I10 Essential (primary) hypertension: Secondary | ICD-10-CM | POA: Insufficient documentation

## 2015-03-16 DIAGNOSIS — F329 Major depressive disorder, single episode, unspecified: Secondary | ICD-10-CM | POA: Insufficient documentation

## 2015-03-16 DIAGNOSIS — Z86010 Personal history of colonic polyps: Secondary | ICD-10-CM | POA: Insufficient documentation

## 2015-03-16 DIAGNOSIS — R531 Weakness: Secondary | ICD-10-CM | POA: Insufficient documentation

## 2015-03-16 LAB — CBC
HCT: 41.4 % (ref 36.0–46.0)
HEMOGLOBIN: 13 g/dL (ref 12.0–15.0)
MCH: 30 pg (ref 26.0–34.0)
MCHC: 31.4 g/dL (ref 30.0–36.0)
MCV: 95.6 fL (ref 78.0–100.0)
Platelets: 392 10*3/uL (ref 150–400)
RBC: 4.33 MIL/uL (ref 3.87–5.11)
RDW: 14.2 % (ref 11.5–15.5)
WBC: 11.6 10*3/uL — AB (ref 4.0–10.5)

## 2015-03-16 LAB — BASIC METABOLIC PANEL
ANION GAP: 11 (ref 5–15)
BUN: 17 mg/dL (ref 6–20)
CO2: 28 mmol/L (ref 22–32)
Calcium: 10 mg/dL (ref 8.9–10.3)
Chloride: 105 mmol/L (ref 101–111)
Creatinine, Ser: 1.51 mg/dL — ABNORMAL HIGH (ref 0.44–1.00)
GFR, EST AFRICAN AMERICAN: 35 mL/min — AB (ref >60)
GFR, EST NON AFRICAN AMERICAN: 31 mL/min — AB (ref >60)
Glucose, Bld: 115 mg/dL — ABNORMAL HIGH (ref 65–99)
POTASSIUM: 3.5 mmol/L (ref 3.5–5.1)
SODIUM: 144 mmol/L (ref 135–145)

## 2015-03-16 LAB — LIPASE, BLOOD: LIPASE: 29 U/L (ref 22–51)

## 2015-03-16 LAB — HEPATIC FUNCTION PANEL
ALBUMIN: 4.6 g/dL (ref 3.5–5.0)
ALK PHOS: 83 U/L (ref 38–126)
ALT: 13 U/L — ABNORMAL LOW (ref 14–54)
AST: 22 U/L (ref 15–41)
Bilirubin, Direct: <0.1 mg/dL — ABNORMAL LOW (ref 0.1–0.5)
TOTAL PROTEIN: 7.2 g/dL (ref 6.5–8.1)
Total Bilirubin: 0.5 mg/dL (ref 0.3–1.2)

## 2015-03-16 LAB — TROPONIN I: Troponin I: <0.03 ng/mL (ref <0.031)

## 2015-03-16 MED ORDER — SODIUM CHLORIDE 0.9 % IV BOLUS (SEPSIS)
1000.0000 mL | Freq: Once | INTRAVENOUS | Status: AC
  Administered 2015-03-16: 1000 mL via INTRAVENOUS

## 2015-03-16 MED ORDER — ONDANSETRON HCL 4 MG/2ML IJ SOLN
4.0000 mg | Freq: Once | INTRAMUSCULAR | Status: AC
  Administered 2015-03-16: 4 mg via INTRAVENOUS
  Filled 2015-03-16: qty 2

## 2015-03-16 NOTE — ED Notes (Signed)
Pt says not feeling well this morning, pt states she almost passed out this evening. Pt says she vomited x1 this evening.

## 2015-03-16 NOTE — ED Provider Notes (Signed)
CSN: [id number]     Arrival date & time 03/16/15  2219 History   First MD Initiated Contact with Patient 03/16/15 2259     Chief Complaint  Patient presents with  . Near Syncope   LEVEL 5 CAVEAT FOR DEMENTIA  (Consider location/radiation/quality/duration/timing/severity/associated sxs/prior Treatment) HPI patient presents to the emergency department with her daughter. She reports the patient "felt bad" all day but cannot clarify what that means. At 2 PM she complained she couldn't hear from her left ear. Her daughter left her house about 6 PM and she was called about 9 PM that the patient was vomiting. She does not report diarrhea. She denies abdominal pain at this time. Daughter states abdominal pain is a common complaint because the patient is constipated. She uses suppositories on a regular basis. She is unaware fever although the patient complained of feeling hot and cold. She denies any headache worse feeling of spinning. She just states she felt weak. They deny any syncope. Her husband is not ill.  PCP Dr Laurance Flatten  Past Medical History  Diagnosis Date  . Hypertension   . Asthma   . Postmenopausal HRT (hormone replacement therapy)   . Depression   . Hyperlipidemia   . GERD (gastroesophageal reflux disease)   . Colon polyps   . RBBB (right bundle branch block)   . Gastric polyps   . Fibromyalgia   . Hiatal hernia   . Dementia    Past Surgical History  Procedure Laterality Date  . Lumbar spine surgery      Disc   Family History  Problem Relation Age of Onset  . Stroke Sister     2 minor strokes  . Kidney disease Brother    Social History  Substance Use Topics  . Smoking status: Never Smoker   . Smokeless tobacco: None  . Alcohol Use: No   Lives at home Lives with spouse  OB History    No data available     Review of Systems  All other systems reviewed and are negative.     Allergies  Aspirin; Codeine phosphate; Darvocet; Glucosamine forte; Naproxen; Nsaids;  Sulfamethoxazole; and Sulfur dioxide  Home Medications   Prior to Admission medications   Medication Sig Start Date End Date Taking? Authorizing Provider  albuterol (PROVENTIL HFA;VENTOLIN HFA) 108 (90 BASE) MCG/ACT inhaler Inhale 2 puffs into the lungs every 6 (six) hours as needed for wheezing. 05/27/14  Yes Tammy Eckard, PHARMD  amLODipine (NORVASC) 10 MG tablet TAKE (1) TABLET DAILY IN THE MORNING. 11/19/14  Yes Chipper Herb, MD  buPROPion (WELLBUTRIN XL) 300 MG 24 hr tablet TAKE 1 TABLET ONCE A DAY 02/19/15  Yes Chipper Herb, MD  busPIRone (BUSPAR) 15 MG tablet Take 1 tablet (15 mg total) by mouth every morning. 03/11/15  Yes Chipper Herb, MD  DULoxetine (CYMBALTA) 60 MG capsule TAKE (1) CAPSULE DAILY 03/10/15  Yes Chipper Herb, MD  lamoTRIgine (LAMICTAL) 25 MG tablet Take 1 tablet (25 mg total) by mouth 2 (two) times daily. 12/23/14  Yes Chipper Herb, MD  levothyroxine (SYNTHROID, LEVOTHROID) 50 MCG tablet Take 1 tablet (50 mcg total) by mouth daily. 08/13/14  Yes Claretta Fraise, MD  LORazepam (ATIVAN) 0.5 MG tablet TAKE ONE TABLET AT BEDTIME 03/12/15  Yes Chipper Herb, MD  Memantine HCl-Donepezil HCl [facility]) 28-10 MG CP24 Take 1 capsule by mouth every evening. 01/08/15  Yes Chipper Herb, MD  mirtazapine (REMERON) 30 MG tablet TAKE ONE TABLET AT BEDTIME  12/23/14  Yes Chipper Herb, MD  pantoprazole (PROTONIX) 40 MG tablet TAKE 1 TABLET ONCE A DAY 01/03/15  Yes Chipper Herb, MD  SALINE NASAL MIST NA Place 1 spray into the nose daily as needed (for congestion).   Yes Historical Provider, MD  simvastatin (ZOCOR) 20 MG tablet Take 1 tablet (20 mg total) by mouth at bedtime. 12/23/14  Yes Chipper Herb, MD  ondansetron (ZOFRAN ODT) 4 MG disintegrating tablet Take 1 tablet (4 mg total) by mouth every 8 (eight) hours as needed for nausea or vomiting. 03/17/15   Rolland Porter, MD   BP 187/76 mmHg  Pulse 68  Temp(Src) 97.7 F (36.5 C) (Oral)  Resp 20  Ht 5\' 6"  (1.676 m)  Wt 131 lb (59.421  kg)  BMI 21.15 kg/m2  SpO2 100%  Vital signs normal hypertension  Physical Exam  Constitutional: She is oriented to person, place, and time. She appears well-developed and well-nourished.  Non-toxic appearance. She does not appear ill. No distress.  HENT:  Head: Normocephalic and atraumatic.  Right Ear: External ear normal.  Left Ear: External ear normal.  Nose: Nose normal. No mucosal edema or rhinorrhea.  Mouth/Throat: Mucous membranes are normal. No dental abscesses or uvula swelling.  Dry tongue  Eyes: Conjunctivae and EOM are normal. Pupils are equal, round, and reactive to light.  Neck: Normal range of motion and full passive range of motion without pain. Neck supple.  Cardiovascular: Normal rate, regular rhythm and normal heart sounds.  Exam reveals no gallop and no friction rub.   No murmur heard. Pulmonary/Chest: Effort normal and breath sounds normal. No respiratory distress. She has no wheezes. She has no rhonchi. She has no rales. She exhibits no tenderness and no crepitus.  Abdominal: Soft. Normal appearance and bowel sounds are normal. She exhibits no distension. There is no tenderness. There is no rebound and no guarding.  Musculoskeletal: Normal range of motion. She exhibits no edema or tenderness.  Moves all extremities well.   Neurological: She is alert and oriented to person, place, and time. She has normal strength. No cranial nerve deficit.  Skin: Skin is warm, dry and intact. No rash noted. No erythema. No pallor.  Psychiatric: She has a normal mood and affect. Her speech is normal and behavior is normal. Her mood appears not anxious.  Nursing note and vitals reviewed.   ED Course  Procedures (including critical care time)  Medications  sodium chloride 0.9 % bolus 1,000 mL (0 mLs Intravenous Stopped 03/17/15 0314)  ondansetron (ZOFRAN) injection 4 mg (4 mg Intravenous Given 03/16/15 2341)   Patient was given 1 L of normal saline and Zofran IV for her nausea. Labs  were ordered.  01:27 feeling better, ready to try oral fluids.   02:15 pt drank fluids and feels fine, will check orthostatic VS and see if she is able to ambulate and if she does well, she can be discharged.   Patient ambulated to the bathroom without difficulty. At this point was felt she had improved and was ready for discharge.  03:13:11 Orthostatic Vital Signs Orthostatic Lying  - BP- Lying: 154/66 mmHg ; Pulse- Lying: 77  Orthostatic Sitting - BP- Sitting: 150/76 mmHg ; Pulse- Sitting: 78  Orthostatic Standing at 0 minutes - BP- Standing at 0 minutes: 168/83 mmHg ; Pulse- Standing at 0 minutes: 88    Orthostatic vital signs are normal.   Labs Review Results for orders placed or performed during the hospital encounter of 03/16/15  Basic metabolic panel  Result Value Ref Range   Sodium 144 135 - 145 mmol/L   Potassium 3.5 3.5 - 5.1 mmol/L   Chloride 105 101 - 111 mmol/L   CO2 28 22 - 32 mmol/L   Glucose, Bld 115 (H) 65 - 99 mg/dL   BUN 17 6 - 20 mg/dL   Creatinine, Ser 1.51 (H) 0.44 - 1.00 mg/dL   Calcium 10.0 8.9 - 10.3 mg/dL   GFR calc non Af Amer 31 (L) >60 mL/min   GFR calc Af Amer 35 (L) >60 mL/min   Anion gap 11 5 - 15  CBC  Result Value Ref Range   WBC 11.6 (H) 4.0 - 10.5 K/uL   RBC 4.33 3.87 - 5.11 MIL/uL   Hemoglobin 13.0 12.0 - 15.0 g/dL   HCT 41.4 36.0 - 46.0 %   MCV 95.6 78.0 - 100.0 fL   MCH 30.0 26.0 - 34.0 pg   MCHC 31.4 30.0 - 36.0 g/dL   RDW 14.2 11.5 - 15.5 %   Platelets 392 150 - 400 K/uL  Urinalysis, Routine w reflex microscopic (not at [facility])  Result Value Ref Range   Color, Urine YELLOW YELLOW   APPearance CLEAR CLEAR   Specific Gravity, Urine 1.010 1.005 - 1.030   pH 8.0 5.0 - 8.0   Glucose, UA NEGATIVE NEGATIVE mg/dL   Hgb urine dipstick NEGATIVE NEGATIVE   Bilirubin Urine NEGATIVE NEGATIVE   Ketones, ur NEGATIVE NEGATIVE mg/dL   Protein, ur NEGATIVE NEGATIVE mg/dL   Urobilinogen, UA 0.2 0.0 - 1.0 mg/dL   Nitrite NEGATIVE NEGATIVE    Leukocytes, UA NEGATIVE NEGATIVE  Troponin I  Result Value Ref Range   Troponin I <0.03 <0.031 ng/mL  Hepatic function panel  Result Value Ref Range   Total Protein 7.2 6.5 - 8.1 g/dL   Albumin 4.6 3.5 - 5.0 g/dL   AST 22 15 - 41 U/L   ALT 13 (L) 14 - 54 U/L   Alkaline Phosphatase 83 38 - 126 U/L   Total Bilirubin 0.5 0.3 - 1.2 mg/dL   Bilirubin, Direct <0.1 (L) 0.1 - 0.5 mg/dL   Indirect Bilirubin NOT CALCULATED 0.3 - 0.9 mg/dL  Lipase, blood  Result Value Ref Range   Lipase 29 22 - 51 U/L   Laboratory interpretation all normal except leukocytosis, stable renal insufficiency     Imaging Review No results found. I have personally reviewed and evaluated these images and lab results as part of my medical decision-making.   EKG Interpretation   Date/Time:  Sunday March 16 2015 22:40:33 EDT Ventricular Rate:  67 PR Interval:  191 QRS Duration: 150 QT Interval:  487 QTC Calculation: 514 R Axis:   -15 Text Interpretation:  Sinus rhythm Right bundle branch block Baseline  wander in lead(s) V6 Since last tracing rate slower (21 Jun 2010)  Confirmed by [facility]  MD-I, Ouida Abeyta ([id number]) on 03/16/2015 11:01:36 PM      MDM   Final diagnoses:  Nausea and vomiting, vomiting of unspecified type  Weakness    Discharge Medication List as of 03/17/2015  3:17 AM    START taking these medications   Details  ondansetron (ZOFRAN ODT) 4 MG disintegrating tablet Take 1 tablet (4 mg total) by mouth every 8 (eight) hours as needed for nausea or vomiting., Starting 03/17/2015, Until Discontinued, Print        Plan discharge  Rolland Porter, MD, Barbette Or, MD 03/17/15 [phone]

## 2015-03-17 LAB — URINALYSIS, ROUTINE W REFLEX MICROSCOPIC
BILIRUBIN URINE: NEGATIVE
Glucose, UA: NEGATIVE mg/dL
HGB URINE DIPSTICK: NEGATIVE
Ketones, ur: NEGATIVE mg/dL
Leukocytes, UA: NEGATIVE
Nitrite: NEGATIVE
PH: 8 (ref 5.0–8.0)
Protein, ur: NEGATIVE mg/dL
SPECIFIC GRAVITY, URINE: 1.01 (ref 1.005–1.030)
UROBILINOGEN UA: 0.2 mg/dL (ref 0.0–1.0)

## 2015-03-17 MED ORDER — ONDANSETRON 4 MG PO TBDP: 4.0000 mg | ORAL_TABLET | Freq: Three times a day (TID) | ORAL | Status: DC | PRN

## 2015-03-17 NOTE — ED Notes (Signed)
Patient provided water for PO fluid challenge

## 2015-03-17 NOTE — ED Notes (Signed)
Patient ambulatory to restroom without assistance. Steady gait

## 2015-03-17 NOTE — ED Notes (Signed)
Pt alert & oriented x4, stable gait. Patient given discharge instructions, paperwork & prescription(s). Patient  instructed to stop at the registration desk to finish any additional paperwork. Patient verbalized understanding. Pt left department w/ no further questions. 

## 2015-03-17 NOTE — Discharge Instructions (Signed)
Liquids this morning, if she is tolerating it well, she can go to a bland diet (toast,crackers,jello,mashed potatoes) at lunchtime. Slowly get her back on a regular diet. Use the zofran if the nasuea or vomiting returns. Have her rechecked if she gets worse.  Nausea and Vomiting Nausea means you feel sick to your stomach. Throwing up (vomiting) is a reflex where stomach contents come out of your mouth. HOME CARE   Take medicine as told by your doctor.  Do not force yourself to eat. However, you do need to drink fluids.  If you feel like eating, eat a normal diet as told by your doctor.  Eat rice, wheat, potatoes, bread, lean meats, yogurt, fruits, and vegetables.  Avoid high-fat foods.  Drink enough fluids to keep your pee (urine) clear or pale yellow.  Ask your doctor how to replace body fluid losses (rehydrate). Signs of body fluid loss (dehydration) include:  Feeling very thirsty.  Dry lips and mouth.  Feeling dizzy.  Dark pee.  Peeing less than normal.  Feeling confused.  Fast breathing or heart rate. GET HELP RIGHT AWAY IF:   You have blood in your throw up.  You have black or bloody poop (stool).  You have a bad headache or stiff neck.  You feel confused.  You have bad belly (abdominal) pain.  You have chest pain or trouble breathing.  You do not pee at least once every 8 hours.  You have cold, clammy skin.  You keep throwing up after 24 to 48 hours.  You have a fever. MAKE SURE YOU:   Understand these instructions.  Will watch your condition.  Will get help right away if you are not doing well or get worse. Document Released: 12/15/2007 Document Revised: 09/20/2011 Document Reviewed: 11/27/2010 [facility] Patient Information 2015 [place], [place]. This information is not intended to replace advice given to you by your health care provider. Make sure you discuss any questions you have with your health care provider.

## 2015-03-26 ENCOUNTER — Encounter: Payer: Self-pay | Admitting: Pharmacist

## 2015-03-26 ENCOUNTER — Ambulatory Visit (INDEPENDENT_AMBULATORY_CARE_PROVIDER_SITE_OTHER): Payer: Medicare Other

## 2015-03-26 ENCOUNTER — Ambulatory Visit (INDEPENDENT_AMBULATORY_CARE_PROVIDER_SITE_OTHER): Payer: Medicare Other | Admitting: Pharmacist

## 2015-03-26 VITALS — BP 140/72 | HR 72 | Ht 66.0 in | Wt 140.0 lb

## 2015-03-26 DIAGNOSIS — Z79899 Other long term (current) drug therapy: Secondary | ICD-10-CM

## 2015-03-26 DIAGNOSIS — R7309 Other abnormal glucose: Secondary | ICD-10-CM | POA: Diagnosis not present

## 2015-03-26 DIAGNOSIS — Z780 Asymptomatic menopausal state: Secondary | ICD-10-CM

## 2015-03-26 DIAGNOSIS — Z0000 Encounter for general adult medical examination without abnormal findings: Secondary | ICD-10-CM | POA: Diagnosis not present

## 2015-03-26 LAB — POCT GLYCOSYLATED HEMOGLOBIN (HGB A1C): Hemoglobin A1C: 5.6

## 2015-03-26 NOTE — Patient Instructions (Addendum)
Traci Wheeler , Thank you for taking time to come for your Medicare Wellness Visit. I appreciate your ongoing commitment to your health goals. Please review the following plan we discussed and let me know if I can assist you in the future.   These are the goals we discussed: 1.  Try to be active everyday - chair exercises or walking 2.  Have eye exam - Dr Marin Comment at [facility] - [phone]) or Dr Hassell Done  / Dr Rona Ravens at [email or web address] in [place] [phone]) 3.  We checked you bone today - looked healthy.     This is a list of the screening recommended for you and due dates:  Health Maintenance  Topic Date Due  . Flu Shot  04/20/2015*  . Shingles Vaccine  Checked cost today - cost is about $200  . Pap Smear  10/31/2015  . Colon Cancer Screening  06/11/2018  . Tetanus Vaccine  03/12/2021  . DEXA scan (bone density measurement)  Completed  . Pneumonia vaccines  Completed  *Topic was postponed. The date shown is not the original due date.    Health Maintenance Adopting a healthy lifestyle and getting preventive care can go a long way to promote health and wellness. Talk with your health care provider about what schedule of regular examinations is right for you. This is a good chance for you to check in with your provider about disease prevention and staying healthy. In between checkups, there are plenty of things you can do on your own. Experts have done a lot of research about which lifestyle changes and preventive measures are most likely to keep you healthy. Ask your health care provider for more information. WEIGHT AND DIET  Eat a healthy diet  Be sure to include plenty of vegetables, fruits, low-fat dairy products, and lean protein.  Do not eat a lot of foods high in solid fats, added sugars, or salt.  Get regular exercise. This is one of the most important things you can do for your health.  Most adults should exercise for at least 150 minutes each week. The exercise should  increase your heart rate and make you sweat (moderate-intensity exercise).  Most adults should also do strengthening exercises at least twice a week. This is in addition to the moderate-intensity exercise.  Maintain a healthy weight  Body mass index (BMI) is a measurement that can be used to identify possible weight problems. It estimates body fat based on height and weight. Your health care provider can help determine your BMI and help you achieve or maintain a healthy weight.  For females 85 years of age and older:   A BMI below 18.5 is considered underweight.  A BMI of 18.5 to 24.9 is normal.  A BMI of 25 to 29.9 is considered overweight.  A BMI of 30 and above is considered obese.  Watch levels of cholesterol and blood lipids  You should start having your blood tested for lipids and cholesterol at 79 years of age, then have this test every 5 years.  You may need to have your cholesterol levels checked more often if:  Your lipid or cholesterol levels are high.  You are older than 79 years of age.  You are at high risk for heart disease.  CANCER SCREENING   Lung Cancer  Lung cancer screening is recommended for adults 92-36 years old who are at high risk for lung cancer because of a history of smoking.  A yearly low-dose CT scan  of the lungs is recommended for people who:  Currently smoke.  Have quit within the past 15 years.  Have at least a 30-pack-year history of smoking. A pack year is smoking an average of one pack of cigarettes a day for 1 year.  Yearly screening should continue until it has been 15 years since you quit.  Yearly screening should stop if you develop a health problem that would prevent you from having lung cancer treatment.  Breast Cancer  Practice breast self-awareness. This means understanding how your breasts normally appear and feel.  It also means doing regular breast self-exams. Let your health care provider know about any changes, no  matter how small.  If you are in your 20s or 30s, you should have a clinical breast exam (CBE) by a health care provider every 1-3 years as part of a regular health exam.  If you are 22 or older, have a CBE every year. Also consider having a breast X-ray (mammogram) every year.  If you have a family history of breast cancer, talk to your health care provider about genetic screening.  If you are at high risk for breast cancer, talk to your health care provider about having an MRI and a mammogram every year.  Breast cancer gene (BRCA) assessment is recommended for women who have family members with BRCA-related cancers. BRCA-related cancers include:  Breast.  Ovarian.  Tubal.  Peritoneal cancers.  Results of the assessment will determine the need for genetic counseling and BRCA1 and BRCA2 testing. Cervical Cancer Routine pelvic examinations to screen for cervical cancer are no longer recommended for nonpregnant women who are considered low risk for cancer of the pelvic organs (ovaries, uterus, and vagina) and who do not have symptoms. A pelvic examination may be necessary if you have symptoms including those associated with pelvic infections. Ask your health care provider if a screening pelvic exam is right for you.   The Pap test is the screening test for cervical cancer for women who are considered at risk.  If you had a hysterectomy for a problem that was not cancer or a condition that could lead to cancer, then you no longer need Pap tests.  If you are older than 65 years, and you have had normal Pap tests for the past 10 years, you no longer need to have Pap tests.  If you have had past treatment for cervical cancer or a condition that could lead to cancer, you need Pap tests and screening for cancer for at least 20 years after your treatment.  If you no longer get a Pap test, assess your risk factors if they change (such as having a new sexual partner). This can affect whether you  should start being screened again.  Some women have medical problems that increase their chance of getting cervical cancer. If this is the case for you, your health care provider may recommend more frequent screening and Pap tests.  The human papillomavirus (HPV) test is another test that may be used for cervical cancer screening. The HPV test looks for the virus that can cause cell changes in the cervix. The cells collected during the Pap test can be tested for HPV.  The HPV test can be used to screen women 30 years of age and older. Getting tested for HPV can extend the interval between normal Pap tests from three to five years.  An HPV test also should be used to screen women of any age who have unclear Pap  test results.  After 79 years of age, women should have HPV testing as often as Pap tests.  Colorectal Cancer  This type of cancer can be detected and often prevented.  Routine colorectal cancer screening usually begins at 79 years of age and continues through 79 years of age.  Your health care provider may recommend screening at an earlier age if you have risk factors for colon cancer.  Your health care provider may also recommend using home test kits to check for hidden blood in the stool.  A small camera at the end of a tube can be used to examine your colon directly (sigmoidoscopy or colonoscopy). This is done to check for the earliest forms of colorectal cancer.  Routine screening usually begins at age 43.  Direct examination of the colon should be repeated every 5-10 years through 79 years of age. However, you may need to be screened more often if early forms of precancerous polyps or small growths are found. Skin Cancer  Check your skin from head to toe regularly.  Tell your health care provider about any new moles or changes in moles, especially if there is a change in a mole's shape or color.  Also tell your health care provider if you have a mole that is larger than  the size of a pencil eraser.  Always use sunscreen. Apply sunscreen liberally and repeatedly throughout the day.  Protect yourself by wearing long sleeves, pants, a wide-brimmed hat, and sunglasses whenever you are outside. HEART DISEASE, DIABETES, AND HIGH BLOOD PRESSURE   Have your blood pressure checked at least every 1-2 years. High blood pressure causes heart disease and increases the risk of stroke.  If you are between 60 years and 31 years old, ask your health care provider if you should take aspirin to prevent strokes.  Have regular diabetes screenings. This involves taking a blood sample to check your fasting blood sugar level.  If you are at a normal weight and have a low risk for diabetes, have this test once every three years after 79 years of age.  If you are overweight and have a high risk for diabetes, consider being tested at a younger age or more often. PREVENTING INFECTION  Hepatitis B  If you have a higher risk for hepatitis B, you should be screened for this virus. You are considered at high risk for hepatitis B if:  You were born in a country where hepatitis B is common. Ask your health care provider which countries are considered high risk.  Your parents were born in a high-risk country, and you have not been immunized against hepatitis B (hepatitis B vaccine).  You have HIV or AIDS.  You use needles to inject street drugs.  You live with someone who has hepatitis B.  You have had sex with someone who has hepatitis B.  You get hemodialysis treatment.  You take certain medicines for conditions, including cancer, organ transplantation, and autoimmune conditions. Hepatitis C  Blood testing is recommended for:  Everyone born from [birth year] through 1963.  Anyone with known risk factors for hepatitis C. Sexually transmitted infections (STIs)  You should be screened for sexually transmitted infections (STIs) including gonorrhea and chlamydia if:  You are  sexually active and are younger than 79 years of age.  You are older than 79 years of age and your health care provider tells you that you are at risk for this type of infection.  Your sexual activity has changed since you  were last screened and you are at an increased risk for chlamydia or gonorrhea. Ask your health care provider if you are at risk.  If you do not have HIV, but are at risk, it may be recommended that you take a prescription medicine daily to prevent HIV infection. This is called pre-exposure prophylaxis (PrEP). You are considered at risk if:  You are sexually active and do not regularly use condoms or know the HIV status of your partner(s).  You take drugs by injection.  You are sexually active with a partner who has HIV. Talk with your health care provider about whether you are at high risk of being infected with HIV. If you choose to begin PrEP, you should first be tested for HIV. You should then be tested every 3 months for as long as you are taking PrEP.  PREGNANCY   If you are premenopausal and you may become pregnant, ask your health care provider about preconception counseling.  If you may become pregnant, take 400 to 800 micrograms (mcg) of folic acid every day.  If you want to prevent pregnancy, talk to your health care provider about birth control (contraception). OSTEOPOROSIS AND MENOPAUSE   Osteoporosis is a disease in which the bones lose minerals and strength with aging. This can result in serious bone fractures. Your risk for osteoporosis can be identified using a bone density scan.  If you are 70 years of age or older, or if you are at risk for osteoporosis and fractures, ask your health care provider if you should be screened.  Ask your health care provider whether you should take a calcium or vitamin D supplement to lower your risk for osteoporosis.  Menopause may have certain physical symptoms and risks.  Hormone replacement therapy may reduce some  of these symptoms and risks. Talk to your health care provider about whether hormone replacement therapy is right for you.  HOME CARE INSTRUCTIONS   Schedule regular health, dental, and eye exams.  Stay current with your immunizations.   Do not use any tobacco products including cigarettes, chewing tobacco, or electronic cigarettes.  If you are pregnant, do not drink alcohol.  If you are breastfeeding, limit how much and how often you drink alcohol.  Limit alcohol intake to no more than 1 drink per day for nonpregnant women. One drink equals 12 ounces of beer, 5 ounces of wine, or 1 ounces of hard liquor.  Do not use street drugs.  Do not share needles.  Ask your health care provider for help if you need support or information about quitting drugs.  Tell your health care provider if you often feel depressed.  Tell your health care provider if you have ever been abused or do not feel safe at home. Document Released: 01/11/2011 Document Revised: 11/12/2013 Document Reviewed: 05/30/2013 [facility] Patient Information 2015 [place], [place]. This information is not intended to replace advice given to you by your health care provider. Make sure you discuss any questions you have with your health care provider.     DASH Eating Plan DASH stands for "Dietary Approaches to Stop Hypertension." The DASH eating plan is a healthy eating plan that has been shown to reduce high blood pressure (hypertension). Additional health benefits may include reducing the risk of type 2 diabetes mellitus, heart disease, and stroke. The DASH eating plan may also help with weight loss. WHAT DO I NEED TO KNOW ABOUT THE DASH EATING PLAN? For the DASH eating plan, you will follow these general  guidelines:  Choose foods with a percent daily value for sodium of less than 5% (as listed on the food label).  Use salt-free seasonings or herbs instead of table salt or sea salt.  Check with your health care provider or  pharmacist before using salt substitutes.  Eat lower-sodium products, often labeled as "lower sodium" or "no salt added."  Eat fresh foods.  Eat more vegetables, fruits, and low-fat dairy products.  Choose whole grains. Look for the word "whole" as the first word in the ingredient list.  Choose fish and skinless chicken or [place] more often than red meat. Limit fish, poultry, and meat to 6 oz (170 g) each day.  Limit sweets, desserts, sugars, and sugary drinks.  Choose heart-healthy fats.  Limit cheese to 1 oz (28 g) per day.  Eat more home-cooked food and less restaurant, buffet, and fast food.  Limit fried foods.  Cook foods using methods other than frying.  Limit canned vegetables. If you do use them, rinse them well to decrease the sodium.  When eating at a restaurant, ask that your food be prepared with less salt, or no salt if possible. WHAT FOODS CAN I EAT? Seek help from a dietitian for individual calorie needs. Grains Whole grain or whole wheat bread. Brown rice. Whole grain or whole wheat pasta. Quinoa, bulgur, and whole grain cereals. Low-sodium cereals. Corn or whole wheat flour tortillas. Whole grain cornbread. Whole grain crackers. Low-sodium crackers. Vegetables Fresh or frozen vegetables (raw, steamed, roasted, or grilled). Low-sodium or reduced-sodium tomato and vegetable juices. Low-sodium or reduced-sodium tomato sauce and paste. Low-sodium or reduced-sodium canned vegetables.  Fruits All fresh, canned (in natural juice), or frozen fruits. Meat and Other Protein Products Ground beef (85% or leaner), grass-fed beef, or beef trimmed of fat. Skinless chicken or [place]. Ground chicken or [place]. Pork trimmed of fat. All fish and seafood. Eggs. Dried beans, peas, or lentils. Unsalted nuts and seeds. Unsalted canned beans. Dairy Low-fat dairy products, such as skim or 1% milk, 2% or reduced-fat cheeses, low-fat ricotta or cottage cheese, or plain low-fat yogurt.  Low-sodium or reduced-sodium cheeses. Fats and Oils Tub margarines without trans fats. Light or reduced-fat mayonnaise and salad dressings (reduced sodium). Avocado. Safflower, olive, or canola oils. Natural peanut or almond butter. Other Unsalted popcorn and pretzels. The items listed above may not be a complete list of recommended foods or beverages. Contact your dietitian for more options. WHAT FOODS ARE NOT RECOMMENDED? Grains White bread. White pasta. White rice. Refined cornbread. Bagels and croissants. Crackers that contain trans fat. Vegetables Creamed or fried vegetables. Vegetables in a cheese sauce. Regular canned vegetables. Regular canned tomato sauce and paste. Regular tomato and vegetable juices. Fruits Dried fruits. Canned fruit in light or heavy syrup. Fruit juice. Meat and Other Protein Products Fatty cuts of meat. Ribs, chicken wings, bacon, sausage, bologna, salami, chitterlings, fatback, hot dogs, bratwurst, and packaged luncheon meats. Salted nuts and seeds. Canned beans with salt. Dairy Whole or 2% milk, cream, half-and-half, and cream cheese. Whole-fat or sweetened yogurt. Full-fat cheeses or blue cheese. Nondairy creamers and whipped toppings. Processed cheese, cheese spreads, or cheese curds. Condiments Onion and garlic salt, seasoned salt, table salt, and sea salt. Canned and packaged gravies. Worcestershire sauce. Tartar sauce. Barbecue sauce. Teriyaki sauce. Soy sauce, including reduced sodium. Steak sauce. Fish sauce. Oyster sauce. Cocktail sauce. Horseradish. Ketchup and mustard. Meat flavorings and tenderizers. Bouillon cubes. Hot sauce. Tabasco sauce. Marinades. Taco seasonings. Relishes. Fats and Oils Butter, stick margarine,  lard, shortening, ghee, and bacon fat. Coconut, palm kernel, or palm oils. Regular salad dressings. Other Pickles and olives. Salted popcorn and pretzels. The items listed above may not be a complete list of foods and beverages to avoid.  Contact your dietitian for more information. WHERE CAN I FIND MORE INFORMATION? National [employer], [employer], [employer]: travelstabloid.com Document Released: 06/17/2011 Document Revised: 11/12/2013 Document Reviewed: 05/02/2013 [facility] Patient Information 2015 [place], [place]. This information is not intended to replace advice given to you by your health care provider. Make sure you discuss any questions you have with your health care provider.

## 2015-03-26 NOTE — Progress Notes (Signed)
Patient ID: Traci Wheeler, female   DOB: [birth year], 81 y.o.   MRN: [id number]    Subjective:   Traci Wheeler is a 81 y.o. female who presents for an Initial Medicare Annual Wellness Visit.  Traci Wheeler lives in [place] with her husband.  She has dementia.  She has someone who comes to their house 6 days a week to help with cleaning, medications and meal preparation.  Patient is smiling and pleasant today which is better then the past few times I have seen her when she has come to the office for an acute problem.   She was seen in the ED at [facility] 03/16/15 for nausea.  She states that this has resolved.    Current Medications (verified) Outpatient Encounter Prescriptions as of 03/26/2015  Medication Sig  . albuterol (PROVENTIL HFA;VENTOLIN HFA) 108 (90 BASE) MCG/ACT inhaler Inhale 2 puffs into the lungs every 6 (six) hours as needed for wheezing.  Marland Kitchen amLODipine (NORVASC) 10 MG tablet TAKE (1) TABLET DAILY IN THE MORNING.  Marland Kitchen buPROPion (WELLBUTRIN XL) 300 MG 24 hr tablet TAKE 1 TABLET ONCE A DAY  . busPIRone (BUSPAR) 15 MG tablet Take 1 tablet (15 mg total) by mouth every morning.  . DULoxetine (CYMBALTA) 60 MG capsule TAKE (1) CAPSULE DAILY  . lamoTRIgine (LAMICTAL) 25 MG tablet Take 1 tablet (25 mg total) by mouth 2 (two) times daily.  Marland Kitchen levothyroxine (SYNTHROID, LEVOTHROID) 50 MCG tablet Take 1 tablet (50 mcg total) by mouth daily.  Marland Kitchen LORazepam (ATIVAN) 0.5 MG tablet TAKE ONE TABLET AT BEDTIME  . Memantine HCl-Donepezil HCl (NAMZARIC) 28-10 MG CP24 Take 1 capsule by mouth every evening.  . mirtazapine (REMERON) 30 MG tablet TAKE ONE TABLET AT BEDTIME  . ondansetron (ZOFRAN ODT) 4 MG disintegrating tablet Take 1 tablet (4 mg total) by mouth every 8 (eight) hours as needed for nausea or vomiting.  . pantoprazole (PROTONIX) 40 MG tablet TAKE 1 TABLET ONCE A DAY  . SALINE NASAL MIST NA Place 1 spray into the nose daily as needed (for congestion).  . simvastatin (ZOCOR) 20 MG tablet Take 1  tablet (20 mg total) by mouth at bedtime.   No facility-administered encounter medications on file as of 03/26/2015.   Patient received medications weekly in packed container to help with administration from [facility].  Allergies (verified) Aspirin; Codeine phosphate; Darvocet; Glucosamine forte; Naproxen; Nsaids; Sulfamethoxazole; and Sulfur dioxide   History: Past Medical History  Diagnosis Date  . Hypertension   . Asthma   . Postmenopausal HRT (hormone replacement therapy)   . Depression   . Hyperlipidemia   . GERD (gastroesophageal reflux disease)   . Colon polyps   . RBBB (right bundle branch block)   . Gastric polyps   . Fibromyalgia   . Hiatal hernia   . Dementia    Past Surgical History  Procedure Laterality Date  . Lumbar spine surgery      Disc   Family History  Problem Relation Age of Onset  . Stroke Sister     2 minor strokes  . Kidney disease Brother    Social History   Occupational History  . Not on file.   Social History Main Topics  . Smoking status: Never Smoker   . Smokeless tobacco: Never Used  . Alcohol Use: No  . Drug Use: No  . Sexual Activity: No    Do you feel safe at home?  Yes  Dietary issues and exercise activities: Current Exercise Habits::  The patient does not participate in regular exercise at present  Current Dietary habits:  Not following any specific diet   Objective:    Today's Vitals   03/26/15 1048  BP: 140/72  Pulse: 72  Height: 5\' 6"  (1.676 m)  Weight: 140 lb (63.504 kg)  PainSc: 0-No pain   Body mass index is 22.61 kg/(m^2).   Dexa Results L1-L3 = +2.1 Total of left hip = +0.2 Total of right hip = +0.2  Activities of Daily Living In your present state of health, do you have any difficulty performing the following activities: 03/26/2015  Hearing? Y  Vision? N  Difficulty concentrating or making decisions? Y  Walking or climbing stairs? N  Dressing or bathing? N  Doing errands, shopping? Y    Preparing Food and eating ? Y  Using the Toilet? N  In the past six months, have you accidently leaked urine? N  Do you have problems with loss of bowel control? N  Managing your Medications? Y  Managing your Finances? Y  Housekeeping or managing your Housekeeping? Y    Are there smokers in your home (other than you)? No   Cardiac Risk Factors include: advanced age (>88men, >27 women);dyslipidemia;hypertension;sedentary lifestyle  Depression Screen PHQ 2/9 Scores 03/26/2015 11/18/2014 11/04/2014 07/09/2014  PHQ - 2 Score 3 0 0 0  PHQ- 9 Score 5 - - -    Fall Risk Fall Risk  03/26/2015 11/18/2014 11/04/2014 07/09/2014 06/13/2014  Falls in the past year? No No No No No    Cognitive Function: MMSE - Mini Mental State Exam 03/26/2015  Not completed: Unable to complete   Patient already has diagnosis of dementia and is currently being treated.  She did not want to perform memory assessment today.  Immunizations and Health Maintenance Immunization History  Administered Date(s) Administered  . Influenza Whole 04/05/2008, 04/13/2010  . Influenza,inj,Quad PF,36+ Mos 04/16/2013, 04/29/2014  . Pneumococcal Conjugate-13 08/27/2013  . Pneumococcal Polysaccharide-23 03/16/2010   There are no preventive care reminders to display for this patient.  Patient Care Team: Chipper Herb, MD as PCP - General (Family Medicine)  Indicate any recent Medical Services you may have received from other than [facility] providers in the past year (date may be approximate).    Assessment:    Annual Wellness Visit  Elevated RBG - was 115 at last check  Screening Tests Health Maintenance  Topic Date Due  . INFLUENZA VACCINE  04/20/2015 (Originally 02/10/2015)  . ZOSTAVAX  11/18/2015 (Originally 04/18/1990)  . PAP SMEAR  10/31/2015  . COLONOSCOPY  06/11/2018  . TETANUS/TDAP  03/12/2021  . DEXA SCAN  Completed  . PNA vac Low Risk Adult  Completed        Plan:   During the course of the visit Areonna was  educated and counseled about the following appropriate screening and preventive services:   Vaccines to include Pneumoccal, Influenza, Hepatitis B, Td, Zostavax -  Patient is UTD except influenza which we will given in October and Zostavax - checked price and was $200 - patient declined.  Colorectal cancer screening - UTD  Cardiovascular disease screening - EKG UTD.  Lipid panel UTD - elevated triglycerides  HTN - improved compared to last check 03/16/2015.  No changes today but I did discuss and send information for her caretaker about DASH diet.   Diabetes screening - checking A1c today  Bone Denisty / Osteoporosis Screening - checked today - WNL  Mammogram - no longer required  PAP - no  longer required  Glaucoma screening / Diabetic Eye Exam - recommended she make appt for eye exam.  She is going to have her caretaker check to see who she saw last and call for appt.  Phone number for 2 optometrist given to patient.  Nutrition counseling - DASH diet disucssed  Advanced Directives - UTD  Physical activity - encouraged daily physical activity as able.  Gave handout and discussed chair exercise to do in home.  High Risk Medications - patient is taking mirtazepine.  She is currently doing do much better than I have seen in the past I am hesitant to make any changes.  However I do think we should continue to monitor and assess if dose can be decreased and maybe discontinued in future.    Orders Placed This Encounter  Procedures  . DG Bone Density    Order Specific Question:  Reason for Exam (SYMPTOM  OR DIAGNOSIS REQUIRED)    Answer:  post menopausal female    Order Specific Question:  Preferred imaging location?    Answer:  Internal  . POCT glycosylated hemoglobin (Hb A1C)    Patient Instructions (the written plan) were given to the patient.   Cherre Robins, [facility]   03/26/2015

## 2015-04-15 ENCOUNTER — Other Ambulatory Visit: Payer: Self-pay | Admitting: Family Medicine

## 2015-04-16 ENCOUNTER — Ambulatory Visit (INDEPENDENT_AMBULATORY_CARE_PROVIDER_SITE_OTHER): Payer: Medicare Other

## 2015-04-16 DIAGNOSIS — Z23 Encounter for immunization: Secondary | ICD-10-CM

## 2015-05-07 ENCOUNTER — Other Ambulatory Visit: Payer: Self-pay | Admitting: Pharmacist

## 2015-05-07 MED ORDER — MEMANTINE HCL-DONEPEZIL HCL ER 28-10 MG PO CP24: 1.0000 | ORAL_CAPSULE | Freq: Every evening | ORAL | Status: DC

## 2015-05-13 ENCOUNTER — Other Ambulatory Visit: Payer: Self-pay | Admitting: Family Medicine

## 2015-05-13 NOTE — Telephone Encounter (Signed)
Last seen 11/18/14 DWM  If approved route to nurse to call into [facility] 

## 2015-05-21 ENCOUNTER — Other Ambulatory Visit: Payer: Self-pay | Admitting: Family Medicine

## 2015-05-23 ENCOUNTER — Other Ambulatory Visit: Payer: Self-pay | Admitting: Family Medicine

## 2015-05-31 ENCOUNTER — Other Ambulatory Visit: Payer: Self-pay | Admitting: Family Medicine

## 2015-06-02 ENCOUNTER — Telehealth: Payer: Self-pay | Admitting: *Deleted

## 2015-06-02 ENCOUNTER — Other Ambulatory Visit: Payer: Self-pay | Admitting: Family Medicine

## 2015-06-02 MED ORDER — BUPROPION HCL ER (XL) 300 MG PO TB24: 300.0000 mg | ORAL_TABLET | Freq: Every day | ORAL | Status: DC

## 2015-06-02 NOTE — Telephone Encounter (Signed)
done

## 2015-06-16 ENCOUNTER — Encounter: Payer: Self-pay | Admitting: Family Medicine

## 2015-06-16 ENCOUNTER — Ambulatory Visit (INDEPENDENT_AMBULATORY_CARE_PROVIDER_SITE_OTHER): Payer: Medicare Other | Admitting: Family Medicine

## 2015-06-16 VITALS — BP 136/74 | HR 73 | Temp 97.1°F | Ht 66.0 in | Wt 175.0 lb

## 2015-06-16 DIAGNOSIS — R198 Other specified symptoms and signs involving the digestive system and abdomen: Secondary | ICD-10-CM | POA: Diagnosis not present

## 2015-06-16 DIAGNOSIS — R05 Cough: Secondary | ICD-10-CM

## 2015-06-16 DIAGNOSIS — F0390 Unspecified dementia without behavioral disturbance: Secondary | ICD-10-CM

## 2015-06-16 DIAGNOSIS — I10 Essential (primary) hypertension: Secondary | ICD-10-CM | POA: Diagnosis not present

## 2015-06-16 DIAGNOSIS — R059 Cough, unspecified: Secondary | ICD-10-CM

## 2015-06-16 NOTE — Patient Instructions (Signed)
We will get lab work today and if necessary order an ultrasound of the abdomen or CT scan once these results are back The patient should take Mucinex twice daily maximum strength blue and white in color with a large glass of water for cough and congestion for 2 weeks We will discuss this with the pharmacy so that they can prepare her medication for her to take She should continue to drink plenty of fluids and stay well hydrated

## 2015-06-16 NOTE — Progress Notes (Signed)
Subjective:    Patient ID: Traci Wheeler, female    DOB: 1927/09/05, 81 y.o.   MRN: [id number]  HPI Patient here today for issues with stomach. She also complains of a cough with clear congestion. She is accompanied today by her husband. The patient does have more regular caregivers at home who is preparing meals in helping clean the home. The patient is in good spirits today. She reviewed what she is eating with the nurse who put her in the room and with me also. She is somewhat hoarse sounding but is not coughing up anything and is not wheezing or short of breath. The patient denies any chest pain or shortness of breath. She denies any blood in the stool or black tarry bowel movements. She says her stomach just feels full. Her memory remains impaired and she keeps repeating information to me during the visit.      Patient Active Problem List   Diagnosis Date Noted  . Malaise and fatigue 10/01/2013  . Hypertension 12/14/2012  . Hyperlipemia 12/14/2012  . Dementia 12/11/2012  . Depression 10/04/2012  . ALLERGIC RHINITIS 07/19/2007  . ASTHMA 07/19/2007  . ESOPHAGEAL REFLUX 07/19/2007   Outpatient Encounter Prescriptions as of 06/16/2015  Medication Sig  . albuterol (PROVENTIL HFA;VENTOLIN HFA) 108 (90 BASE) MCG/ACT inhaler Inhale 2 puffs into the lungs every 6 (six) hours as needed for wheezing.  Marland Kitchen amLODipine (NORVASC) 10 MG tablet TAKE (1) TABLET DAILY IN THE MORNING.  Marland Kitchen buPROPion (WELLBUTRIN XL) 300 MG 24 hr tablet Take 1 tablet (300 mg total) by mouth daily.  . busPIRone (BUSPAR) 15 MG tablet Take 1 tablet (15 mg total) by mouth every morning.  . DULoxetine (CYMBALTA) 60 MG capsule TAKE (1) CAPSULE DAILY  . lamoTRIgine (LAMICTAL) 25 MG tablet Take 1 tablet (25 mg total) by mouth 2 (two) times daily.  Marland Kitchen levothyroxine (SYNTHROID, LEVOTHROID) 50 MCG tablet Take 1 tablet (50 mcg total) by mouth daily.  Marland Kitchen LORazepam (ATIVAN) 0.5 MG tablet TAKE ONE TABLET AT BEDTIME  . Memantine  HCl-Donepezil HCl (NAMZARIC) 28-10 MG CP24 Take 1 capsule by mouth every evening.  . mirtazapine (REMERON) 30 MG tablet TAKE ONE TABLET AT BEDTIME  . NAMZARIC 28-10 MG CP24 Take 1 capsule by mouth every evening.  . ondansetron (ZOFRAN ODT) 4 MG disintegrating tablet Take 1 tablet (4 mg total) by mouth every 8 (eight) hours as needed for nausea or vomiting.  . pantoprazole (PROTONIX) 40 MG tablet TAKE 1 TABLET ONCE A DAY  . SALINE NASAL MIST NA Place 1 spray into the nose daily as needed (for congestion).  . simvastatin (ZOCOR) 20 MG tablet TAKE ONE TABLET AT BEDTIME   No facility-administered encounter medications on file as of 06/16/2015.      Review of Systems  Constitutional: Negative.   HENT: Positive for congestion (clear ).   Eyes: Negative.   Respiratory: Positive for cough.   Cardiovascular: Negative.   Gastrointestinal: Positive for constipation (at times) and abdominal distention.       Eating a good balanced breakfast and healthy snacks   Endocrine: Negative.   Genitourinary: Negative.   Musculoskeletal: Negative.   Skin: Negative.   Allergic/Immunologic: Negative.   Neurological: Negative.   Hematological: Negative.   Psychiatric/Behavioral: Negative.        Objective:   Physical Exam  Constitutional: She is oriented to person, place, and time. She appears well-developed and well-nourished. No distress.  HENT:  Head: Normocephalic and atraumatic.  Right Ear: External  ear normal.  Left Ear: External ear normal.  Nose: Nose normal.  Mouth/Throat: Oropharynx is clear and moist.  Eyes: Conjunctivae and EOM are normal. Pupils are equal, round, and reactive to light. Right eye exhibits no discharge. Left eye exhibits no discharge. No scleral icterus.  Neck: Normal range of motion. Neck supple. No thyromegaly present.  No bruits thyromegaly or anterior cervical adenopathy  Cardiovascular: Normal rate, regular rhythm and normal heart sounds.   No murmur heard. At 72/m    Pulmonary/Chest: Effort normal and breath sounds normal. No respiratory distress. She has no wheezes. She has no rales. She exhibits no tenderness.  There are no rales rhonchi or wheezes. She does have a dry cough.  Abdominal: Soft. Bowel sounds are normal. She exhibits no mass. There is no tenderness. There is no rebound and no guarding.  She has a large umbilical hernia. There is no specific tenderness just generalized tenderness to palpation. The liver and spleen appear normal in size. There is no suprapubic tenderness.  Musculoskeletal: Normal range of motion. She exhibits no edema or tenderness.  The patient continues to have ongoing back pain and arthritic stiffness.  Lymphadenopathy:    She has no cervical adenopathy.  Neurological: She is alert and oriented to person, place, and time. She has normal reflexes. No cranial nerve deficit.  Skin: Skin is warm and dry. No rash noted.  Psychiatric: She has a normal mood and affect. Her behavior is normal. Thought content normal.  She continues to have memory issues. These do not appear to be any worse than on previous visits.  Nursing note and vitals reviewed.  BP 136/74 mmHg  Pulse 73  Temp(Src) 97.1 F (36.2 C) (Oral)  Ht 5' 6"  (1.676 m)  Wt 175 lb (79.379 kg)  BMI 28.26 kg/m2        Assessment & Plan:  1. Abdominal fullness -The patient has had a significant weight gain. Some of this may be due to eating better since someone is preparing her food. Her bowels are moving regularly and she is not having any nausea or vomiting. We will get the lab work and make sure there is no significant finding and we will see her back in a couple weeks and if she is continuing to have problems with this we will consider getting an ultrasound of her abdomen. She has had multiple of these in the past and everything was always normal. - BMP8+EGFR - CBC with Differential/Platelet - Hepatic function panel  2. Cough -She will be asked to take  Mucinex twice daily for cough and congestion with a large glass of water - CBC with Differential/Platelet  3. Dementia, without behavioral disturbance -Continue with current treatment  4. Essential hypertension -The blood pressure is good today and will be no change in treatment  No orders of the defined types were placed in this encounter.   Patient Instructions  We will get lab work today and if necessary order an ultrasound of the abdomen or CT scan once these results are back The patient should take Mucinex twice daily maximum strength blue and white in color with a large glass of water for cough and congestion for 2 weeks We will discuss this with the pharmacy so that they can prepare her medication for her to take She should continue to drink plenty of fluids and stay well hydrated   Arrie Senate MD

## 2015-06-17 ENCOUNTER — Telehealth: Payer: Self-pay | Admitting: Family Medicine

## 2015-06-17 LAB — CBC WITH DIFFERENTIAL/PLATELET
BASOS: 1 %
Basophils Absolute: 0.1 10*3/uL (ref 0.0–0.2)
EOS (ABSOLUTE): 0.2 10*3/uL (ref 0.0–0.4)
EOS: 3 %
HEMATOCRIT: 35.2 % (ref 34.0–46.6)
HEMOGLOBIN: 11.4 g/dL (ref 11.1–15.9)
IMMATURE GRANS (ABS): 0 10*3/uL (ref 0.0–0.1)
Immature Granulocytes: 0 %
LYMPHS: 23 %
Lymphocytes Absolute: 1.4 10*3/uL (ref 0.7–3.1)
MCH: 29.8 pg (ref 26.6–33.0)
MCHC: 32.4 g/dL (ref 31.5–35.7)
MCV: 92 fL (ref 79–97)
MONOCYTES: 11 %
Monocytes Absolute: 0.6 10*3/uL (ref 0.1–0.9)
NEUTROS ABS: 3.6 10*3/uL (ref 1.4–7.0)
Neutrophils: 62 %
Platelets: 427 10*3/uL — ABNORMAL HIGH (ref 150–379)
RBC: 3.83 x10E6/uL (ref 3.77–5.28)
RDW: 14.6 % (ref 12.3–15.4)
WBC: 5.9 10*3/uL (ref 3.4–10.8)

## 2015-06-17 LAB — HEPATIC FUNCTION PANEL
ALK PHOS: 107 IU/L (ref 39–117)
ALT: 9 IU/L (ref 0–32)
AST: 15 IU/L (ref 0–40)
Albumin: 4.3 g/dL (ref 3.5–4.7)
BILIRUBIN, DIRECT: 0.12 mg/dL (ref 0.00–0.40)
Bilirubin Total: 0.4 mg/dL (ref 0.0–1.2)
TOTAL PROTEIN: 6.7 g/dL (ref 6.0–8.5)

## 2015-06-17 LAB — BMP8+EGFR
BUN/Creatinine Ratio: 13 (ref 11–26)
BUN: 19 mg/dL (ref 8–27)
CALCIUM: 10.5 mg/dL — AB (ref 8.7–10.3)
CO2: 25 mmol/L (ref 18–29)
Chloride: 101 mmol/L (ref 97–106)
Creatinine, Ser: 1.41 mg/dL — ABNORMAL HIGH (ref 0.57–1.00)
GFR, EST AFRICAN AMERICAN: 39 mL/min/{1.73_m2} — AB (ref >59)
GFR, EST NON AFRICAN AMERICAN: 34 mL/min/{1.73_m2} — AB (ref >59)
Glucose: 72 mg/dL (ref 65–99)
Potassium: 4.9 mmol/L (ref 3.5–5.2)
Sodium: 142 mmol/L (ref 136–144)

## 2015-06-17 NOTE — Telephone Encounter (Signed)
Family will take mucinex to [facility] and they will pre pack it for her and bring it out tomorrow

## 2015-06-20 ENCOUNTER — Other Ambulatory Visit: Payer: Self-pay | Admitting: Family Medicine

## 2015-06-27 ENCOUNTER — Other Ambulatory Visit: Payer: Self-pay | Admitting: Family Medicine

## 2015-06-30 ENCOUNTER — Encounter: Payer: Self-pay | Admitting: Family Medicine

## 2015-06-30 ENCOUNTER — Ambulatory Visit (INDEPENDENT_AMBULATORY_CARE_PROVIDER_SITE_OTHER): Payer: Medicare Other | Admitting: Family Medicine

## 2015-06-30 VITALS — BP 168/85 | HR 72 | Temp 97.0°F | Ht 66.0 in | Wt 173.0 lb

## 2015-06-30 DIAGNOSIS — R05 Cough: Secondary | ICD-10-CM

## 2015-06-30 DIAGNOSIS — R198 Other specified symptoms and signs involving the digestive system and abdomen: Secondary | ICD-10-CM

## 2015-06-30 DIAGNOSIS — R748 Abnormal levels of other serum enzymes: Secondary | ICD-10-CM | POA: Diagnosis not present

## 2015-06-30 DIAGNOSIS — R7989 Other specified abnormal findings of blood chemistry: Secondary | ICD-10-CM

## 2015-06-30 DIAGNOSIS — R059 Cough, unspecified: Secondary | ICD-10-CM

## 2015-06-30 NOTE — Patient Instructions (Signed)
Patient should continue with her Mucinex twice daily with a large glass of water She should drink plenty of fluids and stay well hydrated

## 2015-06-30 NOTE — Progress Notes (Signed)
Subjective:    Patient ID: Traci Wheeler, female    DOB: August 02, 1927, 81 y.o.   MRN: [id number]  HPI Patient here today for follow up on cough and abdominal pain. She states that both are some better. Recent lab work done was reviewed with the patient. Her creatinine was actually improved recently and her hemoglobin was good with a normal white blood cell count. The patient is in good spirits today and although her creatinine was elevated on the last blood draw it was improved from the time before. She essentially has no complaints today and says that she is coughing less. She reviewed her recent diet with [place] and everything seems to be stable.     Patient Active Problem List   Diagnosis Date Noted  . Malaise and fatigue 10/01/2013  . Hypertension 12/14/2012  . Hyperlipemia 12/14/2012  . Dementia 12/11/2012  . Depression 10/04/2012  . ALLERGIC RHINITIS 07/19/2007  . ASTHMA 07/19/2007  . ESOPHAGEAL REFLUX 07/19/2007   Outpatient Encounter Prescriptions as of 06/30/2015  Medication Sig  . albuterol (PROVENTIL HFA;VENTOLIN HFA) 108 (90 BASE) MCG/ACT inhaler Inhale 2 puffs into the lungs every 6 (six) hours as needed for wheezing.  Marland Kitchen amLODipine (NORVASC) 10 MG tablet TAKE (1) TABLET DAILY IN THE MORNING.  Marland Kitchen buPROPion (WELLBUTRIN XL) 300 MG 24 hr tablet Take 1 tablet (300 mg total) by mouth daily.  . busPIRone (BUSPAR) 15 MG tablet Take 1 tablet (15 mg total) by mouth every morning.  . DULoxetine (CYMBALTA) 60 MG capsule TAKE (1) CAPSULE DAILY  . lamoTRIgine (LAMICTAL) 25 MG tablet TAKE  (1)  TABLET TWICE A DAY.  Marland Kitchen levothyroxine (SYNTHROID, LEVOTHROID) 50 MCG tablet Take 1 tablet (50 mcg total) by mouth daily.  Marland Kitchen LORazepam (ATIVAN) 0.5 MG tablet TAKE ONE TABLET AT BEDTIME  . Memantine HCl-Donepezil HCl (NAMZARIC) 28-10 MG CP24 Take 1 capsule by mouth every evening.  . mirtazapine (REMERON) 30 MG tablet TAKE ONE TABLET AT BEDTIME  . NAMZARIC 28-10 MG CP24 Take 1 capsule by mouth every  evening.  . ondansetron (ZOFRAN ODT) 4 MG disintegrating tablet Take 1 tablet (4 mg total) by mouth every 8 (eight) hours as needed for nausea or vomiting.  . pantoprazole (PROTONIX) 40 MG tablet TAKE 1 TABLET ONCE A DAY  . SALINE NASAL MIST NA Place 1 spray into the nose daily as needed (for congestion).  . simvastatin (ZOCOR) 20 MG tablet TAKE ONE TABLET AT BEDTIME   No facility-administered encounter medications on file as of 06/30/2015.      Review of Systems  Constitutional: Negative.   HENT: Negative.   Eyes: Negative.   Respiratory: Positive for cough (is getting better).   Cardiovascular: Negative.   Gastrointestinal: Positive for abdominal pain (at times - some better).  Endocrine: Negative.   Genitourinary: Negative.   Musculoskeletal: Negative.   Skin: Negative.   Allergic/Immunologic: Negative.   Neurological: Negative.   Hematological: Negative.   Psychiatric/Behavioral: Negative.        Objective:   Physical Exam  Constitutional: She is oriented to person, place, and time. She appears well-developed and well-nourished. No distress.  HENT:  Head: Normocephalic and atraumatic.  Right Ear: External ear normal.  Left Ear: External ear normal.  Nose: Nose normal.  Mouth/Throat: Oropharynx is clear and moist.  Eyes: Conjunctivae and EOM are normal. Pupils are equal, round, and reactive to light. Right eye exhibits no discharge. Left eye exhibits no discharge. No scleral icterus.  Neck: Normal range of motion.  Neck supple. No thyromegaly present.  No adenopathy  Cardiovascular: Normal rate, regular rhythm and normal heart sounds.   No murmur heard. Pulmonary/Chest: Effort normal and breath sounds normal. No respiratory distress. She has no wheezes. She has no rales. She exhibits no tenderness.  The lungs are clear with no wheezes  Abdominal: Soft. Bowel sounds are normal. She exhibits no mass. There is no tenderness. There is no rebound and no guarding.  No  abdominal tenderness or masses and she still has a large umbilical hernia.  Musculoskeletal: Normal range of motion. She exhibits no edema.  Lymphadenopathy:    She has no cervical adenopathy.  Neurological: She is alert and oriented to person, place, and time.  Skin: Skin is warm and dry. No rash noted.  Psychiatric: She has a normal mood and affect. Her behavior is normal. Judgment and thought content normal.  The patient's memory remains impaired but no worse than usual.  Nursing note and vitals reviewed.    BP 168/85 mmHg  Pulse 72  Temp(Src) 97 F (36.1 C) (Oral)  Ht 5' 6" (1.676 m)  Wt 173 lb (78.472 kg)  BMI 27.94 kg/m2      Assessment & Plan:  1. Creatinine elevation -We will recheck the BMP today for stability purposes and in fact the most recent being he had a creatinine that was lower than the one before - BMP8+EGFR  2. Cough -She will continue to take the Mucinex and try to drink plenty of fluids  3. Abdominal fullness -She has no complaints with this today.  No orders of the defined types were placed in this encounter.   Patient Instructions  Patient should continue with her Mucinex twice daily with a large glass of water She should drink plenty of fluids and stay well hydrated   Arrie Senate MD

## 2015-07-01 ENCOUNTER — Other Ambulatory Visit: Payer: Self-pay | Admitting: Family Medicine

## 2015-07-01 LAB — BMP8+EGFR
BUN/Creatinine Ratio: 15 (ref 11–26)
BUN: 19 mg/dL (ref 8–27)
CALCIUM: 10.5 mg/dL — AB (ref 8.7–10.3)
CHLORIDE: 99 mmol/L (ref 96–106)
CO2: 26 mmol/L (ref 18–29)
Creatinine, Ser: 1.31 mg/dL — ABNORMAL HIGH (ref 0.57–1.00)
GFR calc Af Amer: 43 mL/min/{1.73_m2} — ABNORMAL LOW (ref >59)
GFR calc non Af Amer: 37 mL/min/{1.73_m2} — ABNORMAL LOW (ref >59)
GLUCOSE: 62 mg/dL — AB (ref 65–99)
Potassium: 5 mmol/L (ref 3.5–5.2)
Sodium: 144 mmol/L (ref 134–144)

## 2015-07-08 ENCOUNTER — Ambulatory Visit: Payer: Medicare Other | Admitting: Family Medicine

## 2015-07-21 ENCOUNTER — Other Ambulatory Visit: Payer: Self-pay | Admitting: Family Medicine

## 2015-07-22 NOTE — Telephone Encounter (Signed)
Last seen 06/30/15 DWM  If approved route to nurse to call into [facility]

## 2015-07-28 ENCOUNTER — Other Ambulatory Visit: Payer: Self-pay | Admitting: Family Medicine

## 2015-08-15 ENCOUNTER — Other Ambulatory Visit: Payer: Self-pay | Admitting: Family Medicine

## 2015-08-22 ENCOUNTER — Other Ambulatory Visit: Payer: Self-pay | Admitting: Family Medicine

## 2015-09-04 ENCOUNTER — Other Ambulatory Visit: Payer: Self-pay | Admitting: Pharmacist

## 2015-09-04 MED ORDER — MEMANTINE HCL-DONEPEZIL HCL ER 28-10 MG PO CP24: 1.0000 | ORAL_CAPSULE | Freq: Every evening | ORAL | Status: DC

## 2015-09-08 ENCOUNTER — Other Ambulatory Visit: Payer: Self-pay | Admitting: Family Medicine

## 2015-09-19 ENCOUNTER — Other Ambulatory Visit: Payer: Self-pay | Admitting: Family Medicine

## 2015-09-22 NOTE — Telephone Encounter (Signed)
Last filled 08/22/15, last seen 06/23/15. Call in at [place]

## 2015-09-23 ENCOUNTER — Other Ambulatory Visit: Payer: Self-pay | Admitting: Family Medicine

## 2015-10-10 ENCOUNTER — Telehealth: Payer: Self-pay | Admitting: Pharmacist

## 2015-10-10 NOTE — Telephone Encounter (Signed)
Called patient's POA / caregiver to find out how she is doing. Found out that her husband is in nursing home in [place],  Traci Wheeler is working with [facility] DSS to get Traci Wheeler qualified for [employer] and there are plans for him to start in nursing home.  Traci Wheeler is also working to get Traci Wheeler in a facitility as well but this  Has proven more difficult.   There is a caregiver - Traci Wheeler that goes by daily to check on Traci Wheeler and she stays several hours on Saturdays.  We have sent a referral to [facility] to make sure patient is taking meds as prescribes and not having other problems.   Traci Wheeler also has app 10/23/2015 with Dr Laurance Flatten.

## 2015-10-16 ENCOUNTER — Other Ambulatory Visit: Payer: Self-pay | Admitting: Family Medicine

## 2015-10-20 ENCOUNTER — Other Ambulatory Visit: Payer: Self-pay | Admitting: *Deleted

## 2015-10-20 MED ORDER — SIMVASTATIN 20 MG PO TABS: 20.0000 mg | ORAL_TABLET | Freq: Every day | ORAL | Status: DC

## 2015-10-22 ENCOUNTER — Encounter (INDEPENDENT_AMBULATORY_CARE_PROVIDER_SITE_OTHER): Payer: Self-pay

## 2015-10-22 ENCOUNTER — Encounter: Payer: Self-pay | Admitting: Family Medicine

## 2015-10-22 ENCOUNTER — Ambulatory Visit (INDEPENDENT_AMBULATORY_CARE_PROVIDER_SITE_OTHER): Payer: Medicare Other | Admitting: Family Medicine

## 2015-10-22 VITALS — BP 151/80 | HR 74 | Temp 97.0°F | Ht 66.0 in | Wt 174.0 lb

## 2015-10-22 DIAGNOSIS — M545 Low back pain, unspecified: Secondary | ICD-10-CM

## 2015-10-22 DIAGNOSIS — Z1211 Encounter for screening for malignant neoplasm of colon: Secondary | ICD-10-CM | POA: Diagnosis not present

## 2015-10-22 DIAGNOSIS — F329 Major depressive disorder, single episode, unspecified: Secondary | ICD-10-CM | POA: Diagnosis not present

## 2015-10-22 DIAGNOSIS — E559 Vitamin D deficiency, unspecified: Secondary | ICD-10-CM

## 2015-10-22 DIAGNOSIS — F0390 Unspecified dementia without behavioral disturbance: Secondary | ICD-10-CM | POA: Diagnosis not present

## 2015-10-22 DIAGNOSIS — E785 Hyperlipidemia, unspecified: Secondary | ICD-10-CM | POA: Diagnosis not present

## 2015-10-22 DIAGNOSIS — I10 Essential (primary) hypertension: Secondary | ICD-10-CM | POA: Diagnosis not present

## 2015-10-22 DIAGNOSIS — E039 Hypothyroidism, unspecified: Secondary | ICD-10-CM

## 2015-10-22 DIAGNOSIS — F32A Depression, unspecified: Secondary | ICD-10-CM

## 2015-10-22 NOTE — Progress Notes (Signed)
Subjective:    Patient ID: Traci Wheeler, female    DOB: July 16, 1927, 82 y.o.   MRN: [id number]  HPI Pt here for follow up and management of chronic medical problems which includes hyperlipidemia and hypertension. She is taking medications regularly. The patient today complains of back pain and questionable constipation. She is indefinite need of home health assistance. She has depression and dementia. Her husband is now in the nursing home permanently. The patient lives by herself. She will be given an FOBT to return and will get lab work done today. Her weight is stable. The patient denies any chest pain or shortness of breath. She says that her asthma has been under good control. She does have some constipation but no trouble swallowing heartburn indigestion nausea vomiting diarrhea or blood in the stool or black tarry bowel movements. She is passing her water without problems. She does describe some low back pain and we have discussed with her the extra strength Tylenol twice daily would be helpful with this and would not be of any harm to her. We also discussed with her the importance of getting good walking activity and she should have assistance with this. Because she lives by herself with asked that her caregiver get a Lifeline for her to wear at all times at home.    Patient Active Problem List   Diagnosis Date Noted  . Malaise and fatigue 10/01/2013  . Hypertension 12/14/2012  . Hyperlipemia 12/14/2012  . Dementia 12/11/2012  . Depression 10/04/2012  . ALLERGIC RHINITIS 07/19/2007  . ASTHMA 07/19/2007  . ESOPHAGEAL REFLUX 07/19/2007   Outpatient Encounter Prescriptions as of 10/22/2015  Medication Sig  . albuterol (PROVENTIL HFA;VENTOLIN HFA) 108 (90 BASE) MCG/ACT inhaler Inhale 2 puffs into the lungs every 6 (six) hours as needed for wheezing.  Marland Kitchen amLODipine (NORVASC) 10 MG tablet TAKE (1) TABLET DAILY IN THE MORNING.  Marland Kitchen buPROPion (WELLBUTRIN XL) 300 MG 24 hr tablet TAKE 1 TABLET  DAILY  . busPIRone (BUSPAR) 15 MG tablet Take 1 tablet (15 mg total) by mouth every morning.  . DULoxetine (CYMBALTA) 60 MG capsule TAKE (1) CAPSULE DAILY  . lamoTRIgine (LAMICTAL) 25 MG tablet TAKE  (1)  TABLET TWICE A DAY.  Marland Kitchen levothyroxine (SYNTHROID, LEVOTHROID) 50 MCG tablet TAKE 1 TABLET DAILY  . LORazepam (ATIVAN) 0.5 MG tablet TAKE ONE TABLET AT BEDTIME  . mirtazapine (REMERON) 30 MG tablet TAKE ONE TABLET AT BEDTIME  . NAMZARIC 28-10 MG CP24 Take 1 capsule by mouth every evening.  . ondansetron (ZOFRAN ODT) 4 MG disintegrating tablet Take 1 tablet (4 mg total) by mouth every 8 (eight) hours as needed for nausea or vomiting.  . pantoprazole (PROTONIX) 40 MG tablet TAKE 1 TABLET ONCE A DAY  . SALINE NASAL MIST NA Place 1 spray into the nose daily as needed (for congestion).  . simvastatin (ZOCOR) 20 MG tablet Take 1 tablet (20 mg total) by mouth at bedtime.   No facility-administered encounter medications on file as of 10/22/2015.     Review of Systems  Constitutional: Negative.   HENT: Negative.   Eyes: Negative.   Respiratory: Negative.   Cardiovascular: Negative.   Gastrointestinal: Positive for constipation.  Endocrine: Negative.   Genitourinary: Negative.   Musculoskeletal: Positive for back pain.  Skin: Negative.   Allergic/Immunologic: Negative.   Neurological: Negative.   Hematological: Negative.   Psychiatric/Behavioral: Negative.        Objective:   Physical Exam  Constitutional: She appears well-developed and well-nourished.  No distress.  HENT:  Head: Normocephalic and atraumatic.  Right Ear: External ear normal.  Left Ear: External ear normal.  Nose: Nose normal.  Mouth/Throat: Oropharynx is clear and moist.  Eyes: Conjunctivae and EOM are normal. Pupils are equal, round, and reactive to light. Right eye exhibits no discharge. Left eye exhibits no discharge. No scleral icterus.  Neck: Normal range of motion. Neck supple. No thyromegaly present.    Cardiovascular: Normal rate, regular rhythm, normal heart sounds and intact distal pulses.   No murmur heard. Heart is regular at 84/m  Pulmonary/Chest: Effort normal and breath sounds normal. No respiratory distress. She has no wheezes. She has no rales. She exhibits no tenderness.  There is no wheezing and there are good breath sounds bilaterally  Abdominal: Soft. Bowel sounds are normal. She exhibits no mass. There is no tenderness. There is no rebound and no guarding.  The patient has a large umbilical hernia which is unchanged from past visits.  Musculoskeletal: Normal range of motion. She exhibits no edema or tenderness.  Lymphadenopathy:    She has no cervical adenopathy.  Neurological: She is alert. She has normal reflexes. No cranial nerve deficit.  The patient's memory continues to be diminished but appears to be stable from past visits and maybe even better today because she seems to be less stressed since her husband is in a long term care facility.  Skin: Skin is warm and dry. No rash noted.  Psychiatric: She has a normal mood and affect. Her behavior is normal. Thought content normal.  Nursing note and vitals reviewed.  BP 151/80 mmHg  Pulse 74  Temp(Src) 97 F (36.1 C) (Oral)  Ht '5\' 6"'$  (1.676 m)  Wt 174 lb (78.926 kg)  BMI 28.10 kg/m2        Assessment & Plan:  1. Essential hypertension -The patient's blood pressure is elevated today and we will see if we can get home health to help monitor this more closely but I am reluctant to increase the medication anymore due to the fact that she lives by herself and I would not want to drop too low and she is also somewhat stressed because she is living by herself and her husband is no longer with her. - CBC with Differential/Platelet - BMP8+EGFR - Hepatic function panel  2. Dementia, without behavioral disturbance -She will continue with her current medicine and everything seems to be stable with her dementia. - CBC with  Differential/Platelet  3. Hyperlipemia -Continue with current treatment pending results of lab work - CBC with Differential/Platelet - Lipid panel  4. Vitamin D deficiency -Continue with current treatment pending results of lab work - CBC with Differential/Platelet - VITAMIN D 25 Hydroxy (Vit-D Deficiency, Fractures)  5. Screen for colon cancer - Fecal occult blood, imunochemical; Future - CBC with Differential/Platelet  6. Hypothyroidism, unspecified hypothyroidism type -Continue with current treatment pending results of lab work - Thyroid Panel With TSH  7. Depression -This actually seems better than in the past and they're really no change in any of her medications for her depression and anxiety  8. Low back pain -Take extra strength Tylenol one twice daily as needed for this. -Home physical therapy could help the patient with back strengthening  No orders of the defined types were placed in this encounter.   Patient Instructions                       Medicare Annual Wellness Visit  [facility] and the medical providers at [facility] strive to bring you the best medical care.  In doing so we not only want to address your current medical conditions and concerns but also to detect new conditions early and prevent illness, disease and health-related problems.    Medicare offers a yearly Wellness Visit which allows our clinical staff to assess your need for preventative services including immunizations, lifestyle education, counseling to decrease risk of preventable diseases and screening for fall risk and other medical concerns.    This visit is provided free of charge (no copay) for all Medicare recipients. The clinical pharmacists at [facility] have begun to conduct these Wellness Visits which will also include a thorough review of all your medications.    As you primary medical provider recommend that you make an appointment for  your Annual Wellness Visit if you have not done so already this year.  You may set up this appointment before you leave today or you may call back [phone]) and schedule an appointment.  Please make sure when you call that you mention that you are scheduling your Annual Wellness Visit with the clinical pharmacist so that the appointment may be made for the proper length of time.     Continue current medications. Continue good therapeutic lifestyle changes which include good diet and exercise. Fall precautions discussed with patient. If an FOBT was given today- please return it to our front desk. If you are over 41 years old - you may need Prevnar 4 or the adult Pneumonia vaccine.  **Flu shots are available--- please call and schedule a FLU-CLINIC appointment**  After your visit with [place] today you will receive a survey in the mail or online from [employer] regarding your care with [place]. Please take a moment to fill this out. Your feedback is very important to [place] as you can help [place] better understand your patient needs as well as improve your experience and satisfaction. WE CARE ABOUT YOU!!!   We will speak with our home health person in the office and see what we can do to get more home health services for this patient because she lives by herself and has a memory impairment The patient should continue to be careful and do not put herself at risk for falling She should use a Lifeline in case she needs help and no one is near her. She should drink plenty of fluids and stay well hydrated The family should make sure that her medications are prepared for her on a regular basis and that she takes them regularly The family should also make sure that she is eating healthy meals Because of generalized weakness she needs to be careful not put herself at risk for falling and she needs to exercise with assistance   Arrie Senate MD   .

## 2015-10-22 NOTE — Patient Instructions (Addendum)
Medicare Annual Wellness Visit  [facility] and the medical providers at [facility] strive to bring you the best medical care.  In doing so we not only want to address your current medical conditions and concerns but also to detect new conditions early and prevent illness, disease and health-related problems.    Medicare offers a yearly Wellness Visit which allows our clinical staff to assess your need for preventative services including immunizations, lifestyle education, counseling to decrease risk of preventable diseases and screening for fall risk and other medical concerns.    This visit is provided free of charge (no copay) for all Medicare recipients. The clinical pharmacists at [facility] have begun to conduct these Wellness Visits which will also include a thorough review of all your medications.    As you primary medical provider recommend that you make an appointment for your Annual Wellness Visit if you have not done so already this year.  You may set up this appointment before you leave today or you may call back [phone] and schedule an appointment.  Please make sure when you call that you mention that you are scheduling your Annual Wellness Visit with the clinical pharmacist so that the appointment may be made for the proper length of time.     Continue current medications. Continue good therapeutic lifestyle changes which include good diet and exercise. Fall precautions discussed with patient. If an FOBT was given today- please return it to our front desk. If you are over 23 years old - you may need Prevnar 27 or the adult Pneumonia vaccine.  **Flu shots are available--- please call and schedule a FLU-CLINIC appointment**  After your visit with [place] today you will receive a survey in the mail or online from [employer] regarding your care with [place]. Please take a moment to fill this out. Your feedback is very  important to [place] as you can help [place] better understand your patient needs as well as improve your experience and satisfaction. WE CARE ABOUT YOU!!!   We will speak with our home health person in the office and see what we can do to get more home health services for this patient because she lives by herself and has a memory impairment The patient should continue to be careful and do not put herself at risk for falling She should use a Lifeline in case she needs help and no one is near her. She should drink plenty of fluids and stay well hydrated The family should make sure that her medications are prepared for her on a regular basis and that she takes them regularly The family should also make sure that she is eating healthy meals Because of generalized weakness she needs to be careful not put herself at risk for falling and she needs to exercise with assistance

## 2015-10-23 LAB — CBC WITH DIFFERENTIAL/PLATELET
BASOS ABS: 0.1 10*3/uL (ref 0.0–0.2)
Basos: 1 %
EOS (ABSOLUTE): 0.3 10*3/uL (ref 0.0–0.4)
Eos: 4 %
HEMATOCRIT: 39.9 % (ref 34.0–46.6)
HEMOGLOBIN: 13 g/dL (ref 11.1–15.9)
Immature Grans (Abs): 0 10*3/uL (ref 0.0–0.1)
Immature Granulocytes: 0 %
LYMPHS ABS: 1.7 10*3/uL (ref 0.7–3.1)
LYMPHS: 27 %
MCH: 29.6 pg (ref 26.6–33.0)
MCHC: 32.6 g/dL (ref 31.5–35.7)
MCV: 91 fL (ref 79–97)
Monocytes Absolute: 0.5 10*3/uL (ref 0.1–0.9)
Monocytes: 8 %
NEUTROS ABS: 3.6 10*3/uL (ref 1.4–7.0)
Neutrophils: 60 %
Platelets: 455 10*3/uL — ABNORMAL HIGH (ref 150–379)
RBC: 4.39 x10E6/uL (ref 3.77–5.28)
RDW: 14.8 % (ref 12.3–15.4)
WBC: 6.1 10*3/uL (ref 3.4–10.8)

## 2015-10-23 LAB — LIPID PANEL
CHOL/HDL RATIO: 3.6 ratio (ref 0.0–4.4)
Cholesterol, Total: 215 mg/dL — ABNORMAL HIGH (ref 100–199)
HDL: 59 mg/dL (ref >39)
LDL Calculated: 114 mg/dL — ABNORMAL HIGH (ref 0–99)
Triglycerides: 211 mg/dL — ABNORMAL HIGH (ref 0–149)
VLDL Cholesterol Cal: 42 mg/dL — ABNORMAL HIGH (ref 5–40)

## 2015-10-23 LAB — VITAMIN D 25 HYDROXY (VIT D DEFICIENCY, FRACTURES): Vit D, 25-Hydroxy: 34 ng/mL (ref 30.0–100.0)

## 2015-10-23 LAB — BMP8+EGFR
BUN / CREAT RATIO: 14 (ref 12–28)
BUN: 18 mg/dL (ref 8–27)
CO2: 28 mmol/L (ref 18–29)
CREATININE: 1.28 mg/dL — AB (ref 0.57–1.00)
Calcium: 10.3 mg/dL (ref 8.7–10.3)
Chloride: 98 mmol/L (ref 96–106)
GFR, EST AFRICAN AMERICAN: 44 mL/min/{1.73_m2} — AB (ref >59)
GFR, EST NON AFRICAN AMERICAN: 38 mL/min/{1.73_m2} — AB (ref >59)
Glucose: 77 mg/dL (ref 65–99)
Potassium: 5 mmol/L (ref 3.5–5.2)
SODIUM: 144 mmol/L (ref 134–144)

## 2015-10-23 LAB — HEPATIC FUNCTION PANEL
ALBUMIN: 4.5 g/dL (ref 3.5–4.7)
ALK PHOS: 111 IU/L (ref 39–117)
ALT: 11 IU/L (ref 0–32)
AST: 16 IU/L (ref 0–40)
BILIRUBIN TOTAL: 0.3 mg/dL (ref 0.0–1.2)
Bilirubin, Direct: 0.09 mg/dL (ref 0.00–0.40)
Total Protein: 6.8 g/dL (ref 6.0–8.5)

## 2015-10-23 LAB — THYROID PANEL WITH TSH
Free Thyroxine Index: 2.4 (ref 1.2–4.9)
T3 Uptake Ratio: 26 % (ref 24–39)
T4 TOTAL: 9.3 ug/dL (ref 4.5–12.0)
TSH: 3.4 u[IU]/mL (ref 0.450–4.500)

## 2015-11-13 ENCOUNTER — Other Ambulatory Visit: Payer: Self-pay | Admitting: Family Medicine

## 2015-11-19 ENCOUNTER — Other Ambulatory Visit: Payer: Self-pay | Admitting: Family Medicine

## 2015-11-21 ENCOUNTER — Other Ambulatory Visit: Payer: Medicare Other

## 2015-11-21 DIAGNOSIS — Z1211 Encounter for screening for malignant neoplasm of colon: Secondary | ICD-10-CM

## 2015-11-24 ENCOUNTER — Encounter: Payer: Self-pay | Admitting: Family Medicine

## 2015-11-26 ENCOUNTER — Encounter: Payer: Self-pay | Admitting: Family Medicine

## 2015-11-27 LAB — FECAL OCCULT BLOOD, IMMUNOCHEMICAL: Fecal Occult Bld: NEGATIVE

## 2015-11-28 ENCOUNTER — Other Ambulatory Visit: Payer: Self-pay | Admitting: Family Medicine

## 2015-12-01 ENCOUNTER — Encounter: Payer: Self-pay | Admitting: Family Medicine

## 2015-12-09 ENCOUNTER — Ambulatory Visit (INDEPENDENT_AMBULATORY_CARE_PROVIDER_SITE_OTHER): Payer: Medicare Other | Admitting: Family Medicine

## 2015-12-09 ENCOUNTER — Encounter: Payer: Self-pay | Admitting: Family Medicine

## 2015-12-09 VITALS — BP 145/73 | HR 79 | Temp 97.8°F | Ht 66.0 in | Wt 177.0 lb

## 2015-12-09 DIAGNOSIS — J069 Acute upper respiratory infection, unspecified: Secondary | ICD-10-CM

## 2015-12-09 MED ORDER — BENZONATATE 100 MG PO CAPS: 100.0000 mg | ORAL_CAPSULE | Freq: Two times a day (BID) | ORAL | Status: DC | PRN

## 2015-12-09 NOTE — Progress Notes (Signed)
   Subjective:    Patient ID: Traci Wheeler, female    DOB: April 19, 1928, 82 y.o.   MRN: [id number]  HPI  Patient here today for cough and congestion that started 2-3 days ago. S there is no chest pain. She denies sinus congestion or drainage but she is not a very good historian given her dementia. he has had no fever or chills. The cough is largely nonproductive.     Patient Active Problem List   Diagnosis Date Noted  . Malaise and fatigue 10/01/2013  . Hypertension 12/14/2012  . Hyperlipemia 12/14/2012  . Dementia 12/11/2012  . Depression 10/04/2012  . ALLERGIC RHINITIS 07/19/2007  . ASTHMA 07/19/2007  . ESOPHAGEAL REFLUX 07/19/2007   Outpatient Encounter Prescriptions as of 12/09/2015  Medication Sig  . albuterol (PROVENTIL HFA;VENTOLIN HFA) 108 (90 BASE) MCG/ACT inhaler Inhale 2 puffs into the lungs every 6 (six) hours as needed for wheezing.  Marland Kitchen amLODipine (NORVASC) 10 MG tablet TAKE (1) TABLET DAILY IN THE MORNING.  Marland Kitchen buPROPion (WELLBUTRIN XL) 300 MG 24 hr tablet TAKE 1 TABLET DAILY  . busPIRone (BUSPAR) 15 MG tablet Take 1 tablet (15 mg total) by mouth every morning.  . DULoxetine (CYMBALTA) 60 MG capsule TAKE (1) CAPSULE DAILY  . lamoTRIgine (LAMICTAL) 25 MG tablet TAKE  (1)  TABLET TWICE A DAY.  Marland Kitchen levothyroxine (SYNTHROID, LEVOTHROID) 50 MCG tablet TAKE 1 TABLET DAILY  . LORazepam (ATIVAN) 0.5 MG tablet TAKE ONE TABLET AT BEDTIME  . mirtazapine (REMERON) 30 MG tablet TAKE ONE TABLET AT BEDTIME  . NAMZARIC 28-10 MG CP24 Take 1 capsule by mouth every evening.  . pantoprazole (PROTONIX) 40 MG tablet TAKE 1 TABLET ONCE A DAY  . SALINE NASAL MIST NA Place 1 spray into the nose daily as needed (for congestion).  . simvastatin (ZOCOR) 20 MG tablet Take 1 tablet (20 mg total) by mouth at bedtime.  . [DISCONTINUED] ondansetron (ZOFRAN ODT) 4 MG disintegrating tablet Take 1 tablet (4 mg total) by mouth every 8 (eight) hours as needed for nausea or vomiting.   No  facility-administered encounter medications on file as of 12/09/2015.     Review of Systems  Constitutional: Negative.  Negative for fever.  HENT: Positive for congestion and voice change.   Eyes: Negative.   Respiratory: Positive for cough.   Cardiovascular: Negative.   Gastrointestinal: Negative.   Endocrine: Negative.   Genitourinary: Negative.   Musculoskeletal: Negative.   Skin: Negative.   Allergic/Immunologic: Negative.   Neurological: Negative.   Hematological: Negative.   Psychiatric/Behavioral: Negative.        Objective:   Physical Exam  Constitutional: She appears well-developed and well-nourished.  Cardiovascular: Normal rate and regular rhythm.   Pulmonary/Chest: Effort normal and breath sounds normal. She has no wheezes. She has no rales.  Neurological: She is alert.  Psychiatric: She has a normal mood and affect.   BP 145/73 mmHg  Pulse 79  Temp(Src) 97.8 F (36.6 C) (Oral)  Ht 5\' 6"  (1.676 m)  Wt 177 lb (80.287 kg)  BMI 28.58 kg/m2        Assessment & Plan:  1. Acute upper respiratory infection I do not hear any bronchitis or pneumonia on auscultation and clinically she does not appear ill or toxic. I think this is more of an upper respiratory infection. Note that she is allergic to codeine so will use Tessalon Perles for symptomatic relief of her cough  Wardell Honour MD

## 2015-12-13 ENCOUNTER — Other Ambulatory Visit: Payer: Self-pay | Admitting: Family Medicine

## 2015-12-15 ENCOUNTER — Telehealth: Payer: Self-pay | Admitting: Family Medicine

## 2015-12-15 ENCOUNTER — Encounter: Payer: Self-pay | Admitting: *Deleted

## 2015-12-15 NOTE — Telephone Encounter (Signed)
Spoke with pt and she kept making herself cough and was saying she couldn't cough anything up. I asked the pt if she felt like she was congested or if she felt like she had something she needed to cough up and pt stated no but she wanted to make sure she would be able to cough something up if needed. Advised pt to increase fluids and to call back with any questions. Pt voiced understanding.

## 2015-12-19 ENCOUNTER — Other Ambulatory Visit: Payer: Self-pay | Admitting: Family Medicine

## 2015-12-19 NOTE — Telephone Encounter (Signed)
Last seen 10/22/15 Last filled 09/23/15

## 2015-12-25 ENCOUNTER — Telehealth: Payer: Self-pay | Admitting: Family Medicine

## 2015-12-25 ENCOUNTER — Other Ambulatory Visit: Payer: Self-pay | Admitting: *Deleted

## 2015-12-25 MED ORDER — BENZONATATE 100 MG PO CAPS: 100.0000 mg | ORAL_CAPSULE | Freq: Two times a day (BID) | ORAL | Status: DC | PRN

## 2015-12-25 NOTE — Telephone Encounter (Signed)
It is okay to refill [employer] and take as directed

## 2015-12-25 NOTE — Telephone Encounter (Signed)
Pt notified of refill RX sent into the pharmacy Okayed per Dr Laurance Flatten

## 2015-12-25 NOTE — Progress Notes (Signed)
RX sent into pharmacy Ok per Dr Laurance Flatten

## 2015-12-25 NOTE — Telephone Encounter (Signed)
Please review and advise.

## 2016-01-17 ENCOUNTER — Other Ambulatory Visit: Payer: Self-pay | Admitting: Family Medicine

## 2016-02-17 ENCOUNTER — Other Ambulatory Visit: Payer: Self-pay | Admitting: Family Medicine

## 2016-02-20 ENCOUNTER — Other Ambulatory Visit: Payer: Self-pay | Admitting: Family Medicine

## 2016-02-27 ENCOUNTER — Other Ambulatory Visit: Payer: Self-pay | Admitting: Family Medicine

## 2016-03-04 ENCOUNTER — Ambulatory Visit: Payer: Medicare Other | Admitting: Family Medicine

## 2016-03-05 IMAGING — CT CT ABD-PELV W/O CM
2 of 4 series · 16 of 46 positions shown, 18 images · non-contrast
Comparison: None.

CLINICAL DATA: Nausea, not eating or drinking, malleolus, fatigue,
gas, 15 lb weight loss, elevated lipase

EXAM:
CT ABDOMEN AND PELVIS WITHOUT CONTRAST
TECHNIQUE: Multidetector CT imaging of the abdomen and pelvis was performed
following the standard protocol without IV contrast.

[Series 2: abdomen/pelvis w/o contrast · axial · non-contrast · 0.59mm/px · z∈[-464,-79]mm · 13 of 85 slices shown, 15 images]
[im 4/85  soft-tissue]
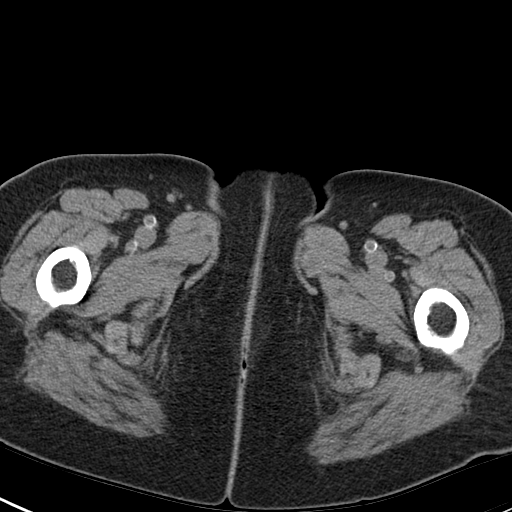
[im 4/85  bone]
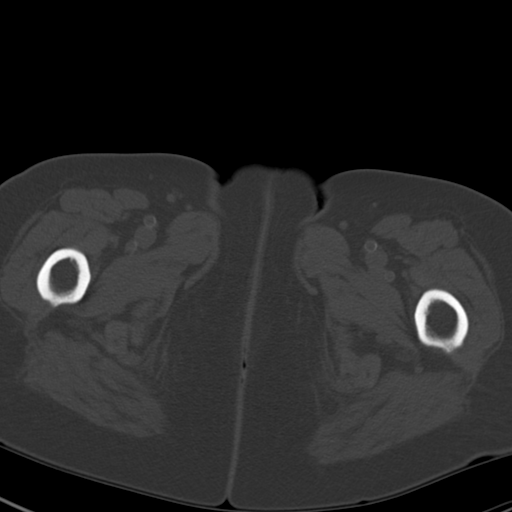
[im 11/85  soft-tissue]
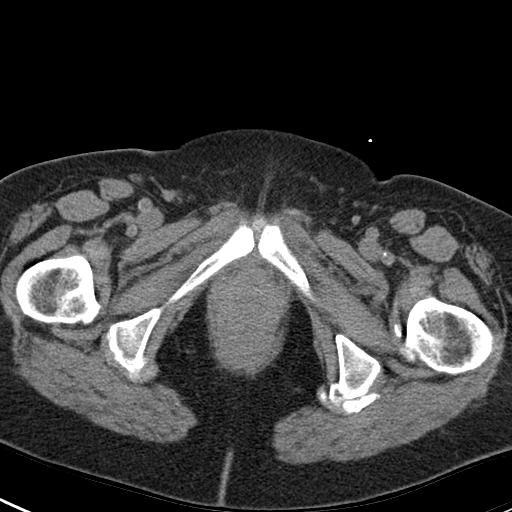
[im 18/85  soft-tissue]
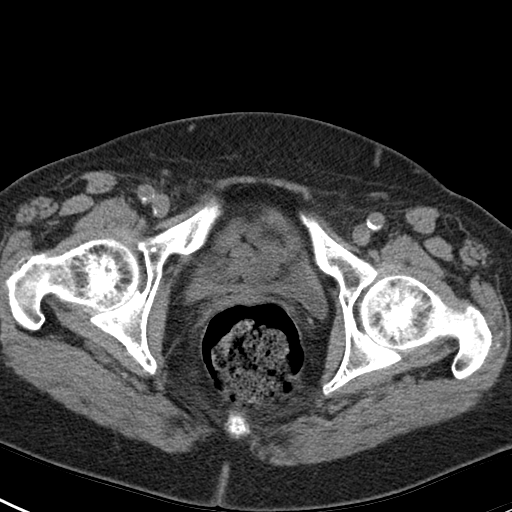
[im 25/85  soft-tissue]
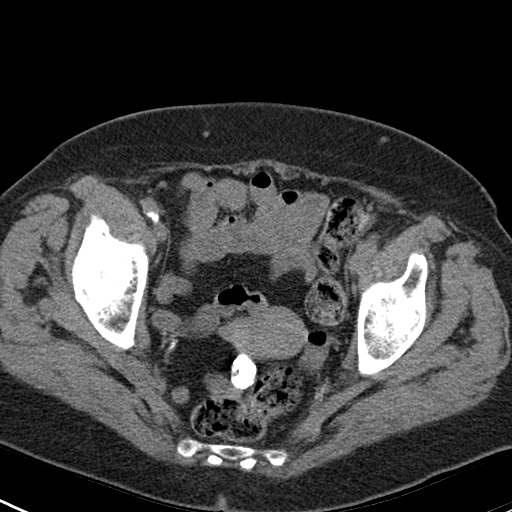
[im 29/85  soft-tissue]
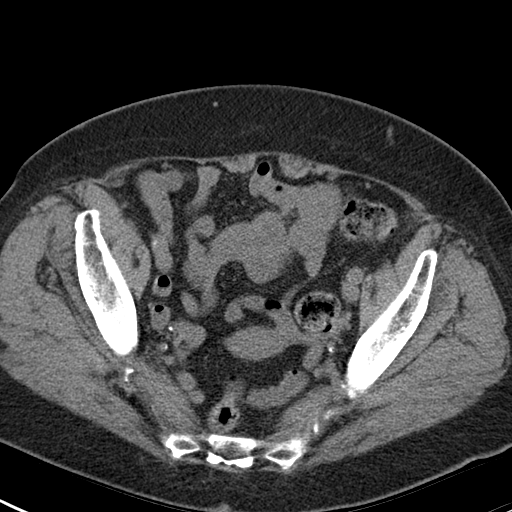
[im 36/85  soft-tissue]
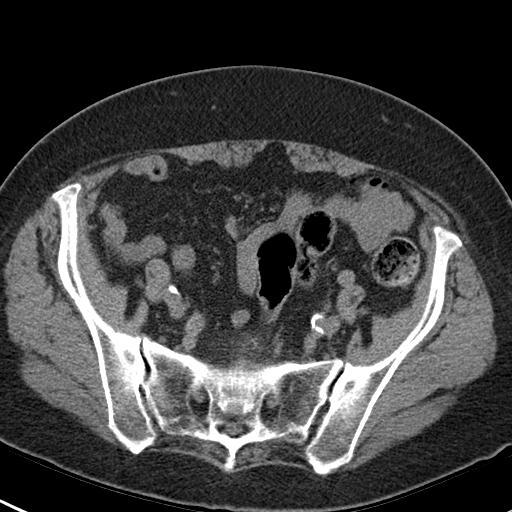
[im 43/85  soft-tissue]
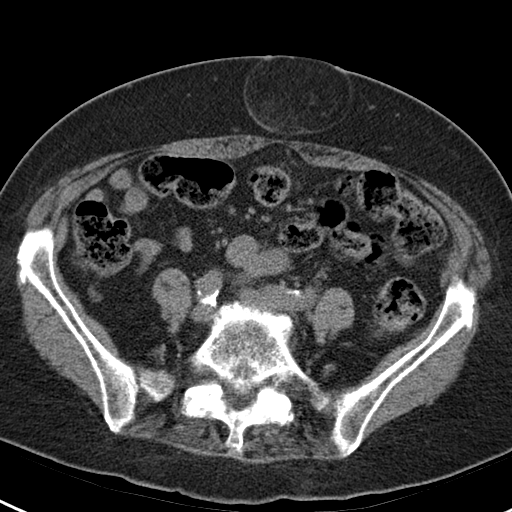
[im 50/85  soft-tissue]
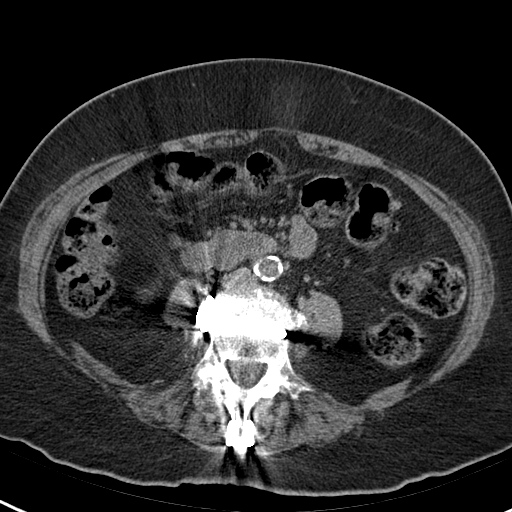
[im 57/85  soft-tissue]
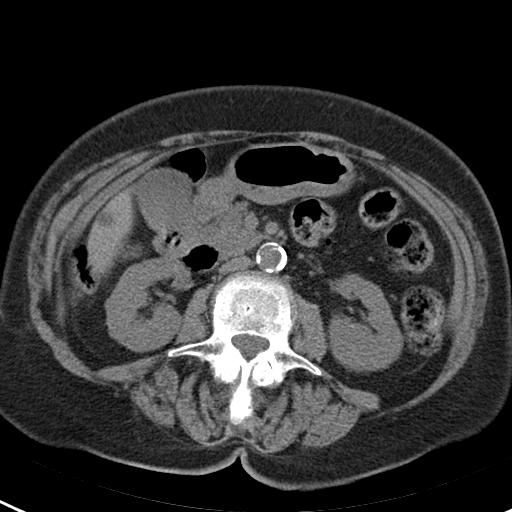
[im 57/85  bone]
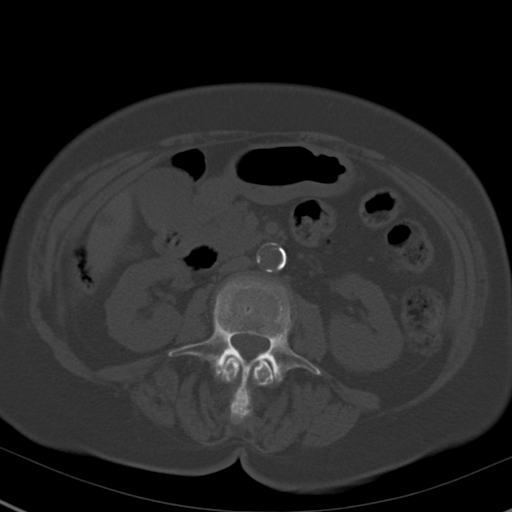
[im 60/85  soft-tissue]
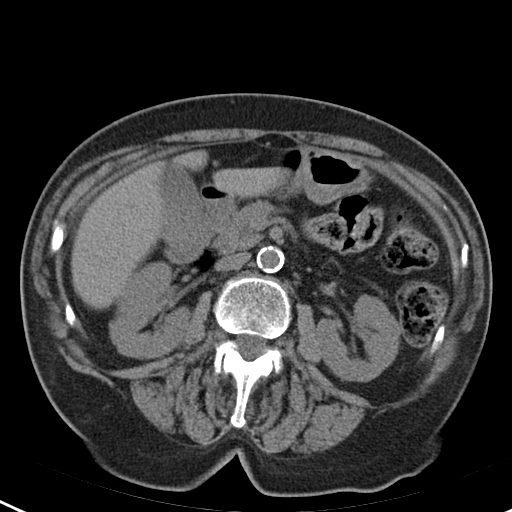
[im 67/85  soft-tissue]
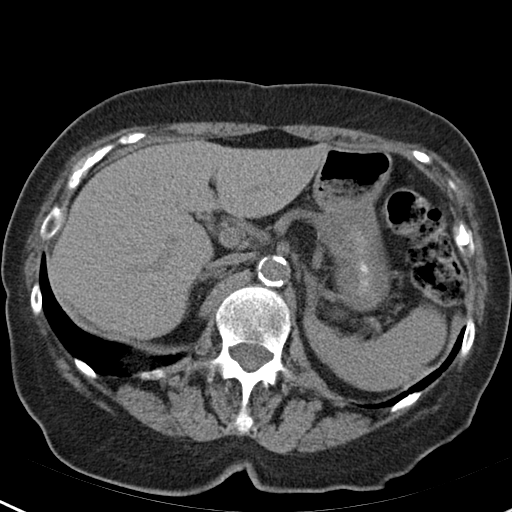
[im 74/85  soft-tissue]
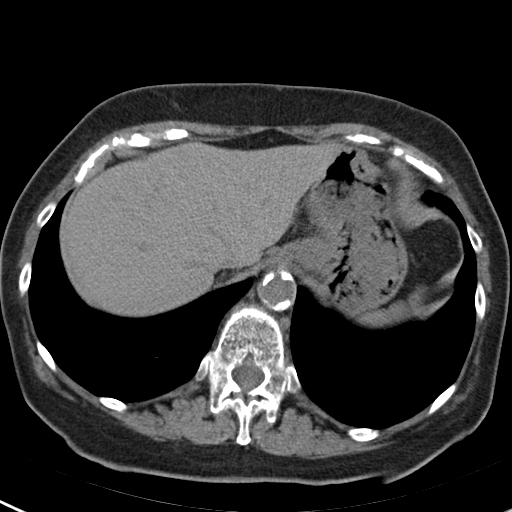
[im 81/85  soft-tissue]
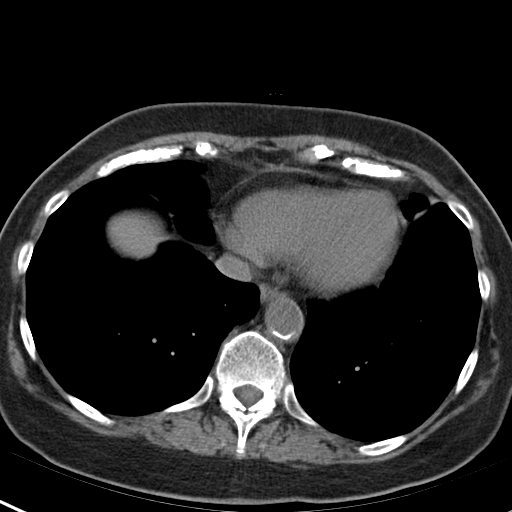

[Series 4: mpr cor (id) · coronal · 0.63mm/px · 3 of 91 slices shown]
[im 31/91  soft-tissue]
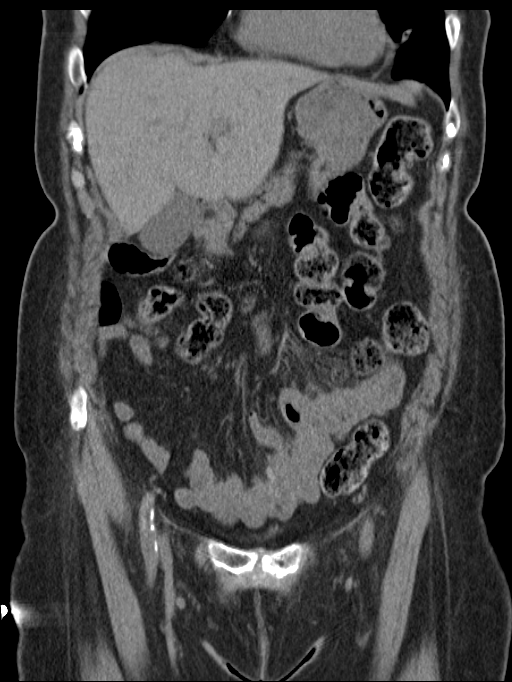
[im 41/91  soft-tissue]
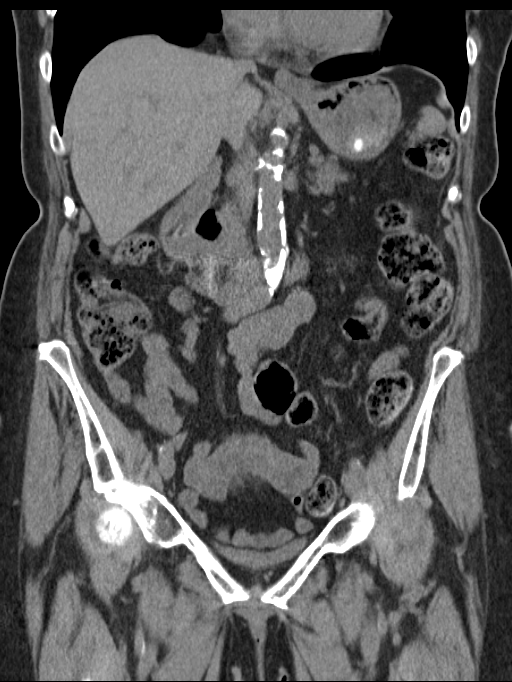
[im 51/91  soft-tissue]
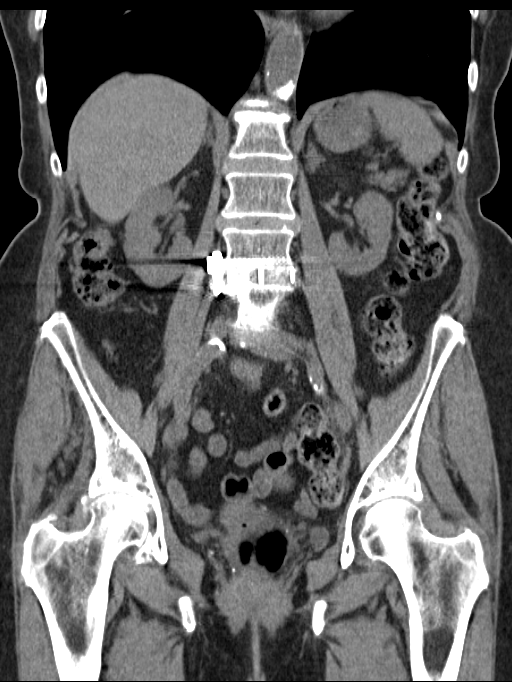

[16 of 46 positions shown; findings below may reference images not displayed]

FINDINGS: The lung bases are clear. There is a right Bochdalek's hernia.

There is a nonobstructing 3 mm left renal calculus. No obstructive
uropathy. No perinephric stranding is seen. The kidneys are
symmetric in size without evidence for exophytic mass. The bladder
is decompressed. There is a calcified right posterior uterine mass
most consistent with a fibroid.

The liver demonstrates no focal abnormality. The gallbladder is
unremarkable. The spleen demonstrates no focal abnormality. The
adrenal glands and pancreas are normal.

The unopacified stomach, duodenum, small intestine and large
intestine are unremarkable, but evaluation is limited by lack of
oral contrast. There is a fat containing umbilical hernia. There is
no pneumoperitoneum, pneumatosis, or portal venous gas. There is no
abdominal or pelvic free fluid. There is no lymphadenopathy.

The abdominal aorta is normal in caliber with atherosclerosis. There
is peripheral vascular atherosclerotic disease.

The osseous structures are unremarkable.Right lateral anterior
lumbar interbody fusion at L3-4 without failure or complication.
Posterior fusion of the L3-4 spinous processes. Degenerative disc
disease with disc height loss at L4-5 and L5-S1 with severe facet
arthropathy at L4-5. Mild degenerative changes of bilateral SI
joints.
IMPRESSION: 1. No acute abdominal or pelvic pathology.
2. Nonobstructing left nephrolithiasis.
3. Back containing umbilical hernia.

## 2016-03-10 ENCOUNTER — Ambulatory Visit (INDEPENDENT_AMBULATORY_CARE_PROVIDER_SITE_OTHER): Payer: Medicare Other

## 2016-03-10 ENCOUNTER — Encounter: Payer: Self-pay | Admitting: Family Medicine

## 2016-03-10 ENCOUNTER — Ambulatory Visit (INDEPENDENT_AMBULATORY_CARE_PROVIDER_SITE_OTHER): Payer: Medicare Other | Admitting: Family Medicine

## 2016-03-10 VITALS — BP 146/76 | HR 74 | Temp 97.3°F | Ht 66.0 in | Wt 179.0 lb

## 2016-03-10 DIAGNOSIS — M545 Low back pain, unspecified: Secondary | ICD-10-CM

## 2016-03-10 DIAGNOSIS — E785 Hyperlipidemia, unspecified: Secondary | ICD-10-CM

## 2016-03-10 DIAGNOSIS — IMO0002 Reserved for concepts with insufficient information to code with codable children: Secondary | ICD-10-CM

## 2016-03-10 DIAGNOSIS — E039 Hypothyroidism, unspecified: Secondary | ICD-10-CM

## 2016-03-10 DIAGNOSIS — E559 Vitamin D deficiency, unspecified: Secondary | ICD-10-CM | POA: Diagnosis not present

## 2016-03-10 DIAGNOSIS — I10 Essential (primary) hypertension: Secondary | ICD-10-CM | POA: Diagnosis not present

## 2016-03-10 DIAGNOSIS — R748 Abnormal levels of other serum enzymes: Secondary | ICD-10-CM

## 2016-03-10 DIAGNOSIS — F0390 Unspecified dementia without behavioral disturbance: Secondary | ICD-10-CM

## 2016-03-10 DIAGNOSIS — R7989 Other specified abnormal findings of blood chemistry: Secondary | ICD-10-CM

## 2016-03-10 NOTE — Progress Notes (Addendum)
Subjective:    Patient ID: Traci Wheeler, female    DOB: 03/24/1928, 82 y.o.   MRN: [id number]  HPI Pt here for follow up and management of chronic medical problems which includes hypothyroid and hyperlipidemia. She is taking medications regularly.The patient comes to the visit today for follow-up. She has a memory impairment and her husband is now in a long-term nursing facility. She is unable to drive and is totally dependent upon caregivers taking her to see her husband and assisting her at home with her eating etc. She does complain of some back pain today and is due to get a chest x-ray and lab work. The patient has a caregiver that stays with her son on the weekends and assures me and the practice that she is eating properly and having more supervision at home. The patient seems to be in good spirits today is smiling and laughing and of course she has to go visit her husband to 3 times a week and has someone to take her there to visit him at the [facility] in [place]. She denies any chest pain or shortness of breath. She does have a Lifeline and she fell recently in the yard and it alert a people to come and help her get up. We instructed her only to take Tylenol for back pain as needed and the caregiver that was with her today understands that. She denies any trouble with her stomach as far as nausea vomiting diarrhea blood in the stool or black tarry bowel movements. She is passing her water without problems.      Patient Active Problem List   Diagnosis Date Noted  . Malaise and fatigue 10/01/2013  . Hypertension 12/14/2012  . Hyperlipemia 12/14/2012  . Dementia 12/11/2012  . Depression 10/04/2012  . ALLERGIC RHINITIS 07/19/2007  . ASTHMA 07/19/2007  . ESOPHAGEAL REFLUX 07/19/2007   Outpatient Encounter Prescriptions as of 03/10/2016  Medication Sig  . albuterol (PROVENTIL HFA;VENTOLIN HFA) 108 (90 BASE) MCG/ACT inhaler Inhale 2 puffs into the lungs every 6 (six) hours as  needed for wheezing.  Marland Kitchen amLODipine (NORVASC) 10 MG tablet TAKE (1) TABLET DAILY IN THE MORNING.  . benzonatate (TESSALON) 100 MG capsule Take 1 capsule (100 mg total) by mouth 2 (two) times daily as needed for cough.  Marland Kitchen buPROPion (WELLBUTRIN XL) 300 MG 24 hr tablet TAKE 1 TABLET DAILY  . busPIRone (BUSPAR) 15 MG tablet Take 1 tablet (15 mg total) by mouth every morning.  . DULoxetine (CYMBALTA) 60 MG capsule TAKE (1) CAPSULE DAILY  . lamoTRIgine (LAMICTAL) 25 MG tablet TAKE  (1)  TABLET TWICE A DAY.  Marland Kitchen levothyroxine (SYNTHROID, LEVOTHROID) 50 MCG tablet TAKE 1 TABLET DAILY  . LORazepam (ATIVAN) 0.5 MG tablet TAKE ONE TABLET AT BEDTIME  . mirtazapine (REMERON) 30 MG tablet TAKE ONE TABLET AT BEDTIME  . NAMZARIC 28-10 MG CP24 Take 1 capsule by mouth every evening.  . pantoprazole (PROTONIX) 40 MG tablet TAKE 1 TABLET ONCE A DAY  . SALINE NASAL MIST NA Place 1 spray into the nose daily as needed (for congestion).  . simvastatin (ZOCOR) 20 MG tablet Take 1 tablet (20 mg total) by mouth at bedtime.   No facility-administered encounter medications on file as of 03/10/2016.       Review of Systems  Constitutional: Negative.   HENT: Negative.   Eyes: Negative.   Respiratory: Negative.   Cardiovascular: Negative.   Gastrointestinal: Negative.   Endocrine: Negative.   Genitourinary: Negative.  Musculoskeletal: Positive for back pain.  Skin: Negative.   Allergic/Immunologic: Negative.   Neurological: Negative.   Hematological: Negative.   Psychiatric/Behavioral: Negative.        Objective:   Physical Exam  Constitutional: She is oriented to person, place, and time. She appears well-developed and well-nourished.  The patient was pleasant and alert during the entire visit and talked about her husband and herself living by herself without crying and being emotional  HENT:  Head: Normocephalic and atraumatic.  Right Ear: External ear normal.  Left Ear: External ear normal.  Nose: Nose  normal.  Mouth/Throat: Oropharynx is clear and moist.  Eyes: Conjunctivae and EOM are normal. Pupils are equal, round, and reactive to light. Right eye exhibits no discharge. Left eye exhibits no discharge. No scleral icterus.  Neck: Normal range of motion. Neck supple. No thyromegaly present.  No bruits or thyromegaly  Cardiovascular: Normal rate, regular rhythm and normal heart sounds.   No murmur heard. Pulmonary/Chest: Effort normal and breath sounds normal. No respiratory distress. She has no wheezes. She has no rales.  Abdominal: Soft. Bowel sounds are normal. She exhibits no mass. There is no tenderness. There is no rebound and no guarding.  The patient has a large umbilical hernia that has been present for a long time and is not causing her any problems.  Musculoskeletal: Normal range of motion. She exhibits no edema.  Lymphadenopathy:    She has no cervical adenopathy.  Neurological: She is alert and oriented to person, place, and time. She has normal reflexes. No cranial nerve deficit.  Skin: Skin is warm and dry. No rash noted.  Psychiatric: She has a normal mood and affect. Her behavior is normal. Judgment and thought content normal.  The patient's mood was positive. She asked me the same question on a couple of times but her memory deficit appears to be stable with her medication.  Nursing note and vitals reviewed.  BP (!) 146/76 (BP Location: Right Arm)   Pulse 74   Temp 97.3 F (36.3 C) (Oral)   Ht _0  (1.676 m)   Wt 179 lb (81.2 kg)   BMI 28.89 kg/m    [facility] reading (PRIMARY) by  Dr.Moore-chest x-ray with results pending                                       Assessment & Plan:  1. Essential hypertension -The blood pressure is good today and she will continue with current treatment - DG Chest 2 View; Future - BMP8+EGFR - CBC with Differential/Platelet - Hepatic function panel  2. Hyperlipemia -She will continue with simvastatin and with as aggressive  therapeutic lifestyle changes as possible - DG Chest 2 View; Future - Lipid panel - CBC with Differential/Platelet  3. Vitamin D deficiency -Continue with vitamin D replacement pending results of lab work - CBC with Differential/Platelet - VITAMIN D 25 Hydroxy (Vit-D Deficiency, Fractures)  4. Hypothyroidism, unspecified hypothyroidism type -Continue with thyroid replacement pending results of lab work - CBC with Differential/Platelet  5. Dementia, without behavioral disturbance -Continue with Namenda and Aricept and dementia seems to be stable.  6. Creatinine elevation -She has no edema and she is passing her water without problems  7. Midline low back pain without sciatica -She will take Tylenol as needed for pain  No orders of the defined types were placed in this encounter.  Patient Instructions  Medicare Annual Wellness Visit  [employer] and the medical providers at [facility] strive to bring you the best medical care.  In doing so we not only want to address your current medical conditions and concerns but also to detect new conditions early and prevent illness, disease and health-related problems.    Medicare offers a yearly Wellness Visit which allows our clinical staff to assess your need for preventative services including immunizations, lifestyle education, counseling to decrease risk of preventable diseases and screening for fall risk and other medical concerns.    This visit is provided free of charge (no copay) for all Medicare recipients. The clinical pharmacists at [facility] have begun to conduct these Wellness Visits which will also include a thorough review of all your medications.    As you primary medical provider recommend that you make an appointment for your Annual Wellness Visit if you have not done so already this year.  You may set up this appointment before you leave today or you may  call back ([phone]) and schedule an appointment.  Please make sure when you call that you mention that you are scheduling your Annual Wellness Visit with the clinical pharmacist so that the appointment may be made for the proper length of time.     Continue current medications. Continue good therapeutic lifestyle changes which include good diet and exercise. Fall precautions discussed with patient. If an FOBT was given today- please return it to our front desk. If you are over 63 years old - you may need Prevnar 59 or the adult Pneumonia vaccine.   After your visit with [place] today you will receive a survey in the mail or online from [employer] regarding your care with [place]. Please take a moment to fill this out. Your feedback is very important to [place] as you can help [place] better understand your patient needs as well as improve your experience and satisfaction. WE CARE ABOUT YOU!!!   Stay is active as possible Walk when possible with assistance Try not to be in a hurry so you will not fall Don't forget to get your flu shot in October    Arrie Senate MD

## 2016-03-10 NOTE — Patient Instructions (Addendum)
Medicare Annual Wellness Visit  [facility] and the medical providers at [facility] strive to bring you the best medical care.  In doing so we not only want to address your current medical conditions and concerns but also to detect new conditions early and prevent illness, disease and health-related problems.    Medicare offers a yearly Wellness Visit which allows our clinical staff to assess your need for preventative services including immunizations, lifestyle education, counseling to decrease risk of preventable diseases and screening for fall risk and other medical concerns.    This visit is provided free of charge (no copay) for all Medicare recipients. The clinical pharmacists at [facility] have begun to conduct these Wellness Visits which will also include a thorough review of all your medications.    As you primary medical provider recommend that you make an appointment for your Annual Wellness Visit if you have not done so already this year.  You may set up this appointment before you leave today or you may call back [phone] and schedule an appointment.  Please make sure when you call that you mention that you are scheduling your Annual Wellness Visit with the clinical pharmacist so that the appointment may be made for the proper length of time.     Continue current medications. Continue good therapeutic lifestyle changes which include good diet and exercise. Fall precautions discussed with patient. If an FOBT was given today- please return it to our front desk. If you are over 61 years old - you may need Prevnar 36 or the adult Pneumonia vaccine.   After your visit with [place] today you will receive a survey in the mail or online from [employer] regarding your care with [place]. Please take a moment to fill this out. Your feedback is very important to [place] as you can help [place] better understand your patient needs as well as  improve your experience and satisfaction. WE CARE ABOUT YOU!!!   Stay is active as possible Walk when possible with assistance Try not to be in a hurry so you will not fall Don't forget to get your flu shot in October

## 2016-03-11 LAB — CBC WITH DIFFERENTIAL/PLATELET
BASOS ABS: 0.1 10*3/uL (ref 0.0–0.2)
Basos: 1 %
EOS (ABSOLUTE): 0.4 10*3/uL (ref 0.0–0.4)
Eos: 5 %
Hematocrit: 40.1 % (ref 34.0–46.6)
Hemoglobin: 13.3 g/dL (ref 11.1–15.9)
IMMATURE GRANS (ABS): 0 10*3/uL (ref 0.0–0.1)
Immature Granulocytes: 0 %
LYMPHS: 32 %
Lymphocytes Absolute: 2.6 10*3/uL (ref 0.7–3.1)
MCH: 29.6 pg (ref 26.6–33.0)
MCHC: 33.2 g/dL (ref 31.5–35.7)
MCV: 89 fL (ref 79–97)
Monocytes Absolute: 0.8 10*3/uL (ref 0.1–0.9)
Monocytes: 10 %
NEUTROS ABS: 4.2 10*3/uL (ref 1.4–7.0)
NEUTROS PCT: 52 %
PLATELETS: 445 10*3/uL — AB (ref 150–379)
RBC: 4.5 x10E6/uL (ref 3.77–5.28)
RDW: 15.2 % (ref 12.3–15.4)
WBC: 8 10*3/uL (ref 3.4–10.8)

## 2016-03-11 LAB — LIPID PANEL
CHOL/HDL RATIO: 3.8 ratio (ref 0.0–4.4)
Cholesterol, Total: 203 mg/dL — ABNORMAL HIGH (ref 100–199)
HDL: 53 mg/dL (ref >39)
LDL CALC: 81 mg/dL (ref 0–99)
Triglycerides: 344 mg/dL — ABNORMAL HIGH (ref 0–149)
VLDL Cholesterol Cal: 69 mg/dL — ABNORMAL HIGH (ref 5–40)

## 2016-03-11 LAB — BMP8+EGFR
BUN/Creatinine Ratio: 15 (ref 12–28)
BUN: 20 mg/dL (ref 8–27)
CALCIUM: 10.3 mg/dL (ref 8.7–10.3)
CHLORIDE: 99 mmol/L (ref 96–106)
CO2: 29 mmol/L (ref 18–29)
Creatinine, Ser: 1.32 mg/dL — ABNORMAL HIGH (ref 0.57–1.00)
GFR calc non Af Amer: 37 mL/min/{1.73_m2} — ABNORMAL LOW (ref >59)
GFR, EST AFRICAN AMERICAN: 42 mL/min/{1.73_m2} — AB (ref >59)
GLUCOSE: 86 mg/dL (ref 65–99)
POTASSIUM: 5.2 mmol/L (ref 3.5–5.2)
Sodium: 142 mmol/L (ref 134–144)

## 2016-03-11 LAB — VITAMIN D 25 HYDROXY (VIT D DEFICIENCY, FRACTURES): VIT D 25 HYDROXY: 31 ng/mL (ref 30.0–100.0)

## 2016-03-11 LAB — HEPATIC FUNCTION PANEL
ALBUMIN: 4.6 g/dL (ref 3.5–4.7)
ALT: 8 IU/L (ref 0–32)
AST: 16 IU/L (ref 0–40)
Alkaline Phosphatase: 115 IU/L (ref 39–117)
BILIRUBIN, DIRECT: 0.08 mg/dL (ref 0.00–0.40)
Bilirubin Total: 0.2 mg/dL (ref 0.0–1.2)
TOTAL PROTEIN: 7.3 g/dL (ref 6.0–8.5)

## 2016-03-20 ENCOUNTER — Other Ambulatory Visit: Payer: Self-pay | Admitting: Family Medicine

## 2016-03-22 ENCOUNTER — Other Ambulatory Visit: Payer: Self-pay | Admitting: Family Medicine

## 2016-03-31 ENCOUNTER — Other Ambulatory Visit: Payer: Self-pay | Admitting: Family Medicine

## 2016-04-08 ENCOUNTER — Other Ambulatory Visit: Payer: Self-pay | Admitting: Family Medicine

## 2016-04-12 ENCOUNTER — Telehealth: Payer: Self-pay | Admitting: Pharmacist

## 2016-04-12 NOTE — Telephone Encounter (Signed)
Samples for Namzeric 28mg  daily (only has titrateion pack but pharmacy to mix so that patient gets 28mg  daily) sent to pharmacy for patient to use in med boxes.

## 2016-04-20 ENCOUNTER — Other Ambulatory Visit: Payer: Self-pay | Admitting: Family Medicine

## 2016-04-20 NOTE — Telephone Encounter (Signed)
Last filled 03/20/16. Call in at [facility]

## 2016-04-21 ENCOUNTER — Other Ambulatory Visit: Payer: Self-pay | Admitting: Family Medicine

## 2016-04-23 ENCOUNTER — Ambulatory Visit (INDEPENDENT_AMBULATORY_CARE_PROVIDER_SITE_OTHER): Payer: Medicare Other

## 2016-04-23 DIAGNOSIS — Z23 Encounter for immunization: Secondary | ICD-10-CM

## 2016-04-26 ENCOUNTER — Other Ambulatory Visit: Payer: Self-pay | Admitting: Family Medicine

## 2016-04-30 ENCOUNTER — Other Ambulatory Visit: Payer: Self-pay | Admitting: Family Medicine

## 2016-05-08 ENCOUNTER — Encounter: Payer: Self-pay | Admitting: Physician Assistant

## 2016-05-08 ENCOUNTER — Ambulatory Visit (INDEPENDENT_AMBULATORY_CARE_PROVIDER_SITE_OTHER): Payer: Medicare Other | Admitting: Physician Assistant

## 2016-05-08 VITALS — BP 155/70 | HR 70 | Temp 97.0°F | Resp 18 | Ht 66.0 in | Wt 182.0 lb

## 2016-05-08 DIAGNOSIS — R399 Unspecified symptoms and signs involving the genitourinary system: Secondary | ICD-10-CM

## 2016-05-08 DIAGNOSIS — N3000 Acute cystitis without hematuria: Secondary | ICD-10-CM

## 2016-05-08 MED ORDER — NITROFURANTOIN MONOHYD MACRO 100 MG PO CAPS: 100.0000 mg | ORAL_CAPSULE | Freq: Two times a day (BID) | ORAL | 0 refills | Status: DC

## 2016-05-08 NOTE — Progress Notes (Signed)
BP (!) 155/70   Pulse 70   Temp 97 F (36.1 C) (Oral)   Resp 18   Ht 5\' 6"  (1.676 m)   Wt 182 lb (82.6 kg)   SpO2 100%   BMI 29.38 kg/m    Subjective:    Patient ID: Traci Wheeler, female    DOB: [birth year], 82 y.o.   MRN: [id number]  HPI: Traci Wheeler is a 82 y.o. female presenting on 05/08/2016 for Generalized Body Aches; Nausea; and Chills  Traci Wheeler, Alzheimer's dementia patient, comes in today with her caregiver. She has had this 24 hours of "not feeling right". She is unable to describe any specific symptom. She has had some body aches chills and did feel nausea last night. She did not vomit. She has not ran a fever. She did get an extra dose of name Z Randall Hiss through her prepackaged medications. This pharmacy used [employer] 14/10 2 tablets for her dosing versus her normal 28/10 one daily. She has been on this medication for a long time and the slight increase in Aricept should not really cause this much symptomology. She has been on it long enough to not have sensitivity to the Aricept. She has known urinary tract infections. Denies any upper respiratory infection symptoms. The patient's husband also died just 2 days ago. The caregiver reports that she has had added confusion.  Relevant past medical, surgical, family and social history reviewed and updated as indicated. Allergies and medications reviewed and updated.  Past Medical History:  Diagnosis Date  . Asthma   . Colon polyps   . Dementia   . Depression   . Fibromyalgia   . Gastric polyps   . GERD (gastroesophageal reflux disease)   . Hiatal hernia   . Hyperlipidemia   . Hypertension   . Postmenopausal HRT (hormone replacement therapy)   . RBBB (right bundle branch block)     Past Surgical History:  Procedure Laterality Date  . LUMBAR SPINE SURGERY     Disc    Review of Systems  Constitutional: Negative.  Negative for fatigue.  HENT: Negative.  Negative for congestion, sinus pressure and sore  throat.   Eyes: Negative.   Respiratory: Negative.   Cardiovascular: Negative.   Gastrointestinal: Negative.   Genitourinary: Positive for difficulty urinating and dysuria. Negative for flank pain and urgency.  Psychiatric/Behavioral: Positive for confusion.      Medication List       Accurate as of 05/08/16  9:51 AM. Always use your most recent med list.          albuterol 108 (90 Base) MCG/ACT inhaler Commonly known as:  PROVENTIL HFA;VENTOLIN HFA Inhale 2 puffs into the lungs every 6 (six) hours as needed for wheezing.   amLODipine 10 MG tablet Commonly known as:  NORVASC TAKE (1) TABLET DAILY IN THE MORNING.   buPROPion 300 MG 24 hr tablet Commonly known as:  WELLBUTRIN XL TAKE 1 TABLET DAILY   busPIRone 15 MG tablet Commonly known as:  BUSPAR TAKE 1 TABLET EVERY MORNING   DULoxetine 60 MG capsule Commonly known as:  CYMBALTA TAKE (1) CAPSULE DAILY   lamoTRIgine 25 MG tablet Commonly known as:  LAMICTAL TAKE  (1)  TABLET TWICE A DAY.   levothyroxine 50 MCG tablet Commonly known as:  SYNTHROID, LEVOTHROID TAKE 1 TABLET DAILY   LORazepam 0.5 MG tablet Commonly known as:  ATIVAN TAKE ONE TABLET AT BEDTIME   mirtazapine 30 MG tablet Commonly known as:  REMERON TAKE ONE TABLET AT BEDTIME   NAMZARIC 28-10 MG Cp24 Generic drug:  Memantine HCl-Donepezil HCl Take 1 capsule by mouth every evening.   nitrofurantoin (macrocrystal-monohydrate) 100 MG capsule Commonly known as:  MACROBID Take 1 capsule (100 mg total) by mouth 2 (two) times daily. 1 po BId   pantoprazole 40 MG tablet Commonly known as:  PROTONIX TAKE 1 TABLET ONCE A DAY   SALINE NASAL MIST NA Place 1 spray into the nose daily as needed (for congestion).   simvastatin 20 MG tablet Commonly known as:  ZOCOR TAKE ONE TABLET AT BEDTIME          Objective:    BP (!) 155/70   Pulse 70   Temp 97 F (36.1 C) (Oral)   Resp 18   Ht 5\' 6"  (1.676 m)   Wt 182 lb (82.6 kg)   SpO2 100%    BMI 29.38 kg/m   Allergies  Allergen Reactions  . Aspirin Nausea And Vomiting  . Codeine Phosphate     REACTION: unspecified  . Darvocet [Propoxyphene N-Acetaminophen]   . Glucosamine Forte [Nutritional Supplements]   . Naproxen     REACTION: unspecified  . Nsaids   . Sulfamethoxazole     REACTION: unspecified  . Sulfur Dioxide Itching    Physical Exam  Constitutional: She is oriented to person, place, and time. She appears well-developed and well-nourished.  HENT:  Head: Normocephalic and atraumatic.  Eyes: Conjunctivae are normal. Pupils are equal, round, and reactive to light.  Cardiovascular: Normal rate, regular rhythm, normal heart sounds and intact distal pulses.   Pulmonary/Chest: Effort normal and breath sounds normal.  Abdominal: Soft. Bowel sounds are normal. She exhibits no distension and no mass. There is tenderness in the suprapubic area. There is no rebound, no guarding and no CVA tenderness.  Neurological: She is alert and oriented to person, place, and time. She has normal reflexes.  Skin: Skin is warm and dry. No rash noted.  Psychiatric: She has a normal mood and affect. Her behavior is normal. Judgment and thought content normal.        Assessment & Plan:   1. UTI symptoms - Urinalysis  2. Acute cystitis without hematuria macrobid 100 mg 1 BID  Continue all other maintenance medications as listed above.  Follow up plan: Prn worsening or non resolution of symtoms  Orders Placed This Encounter  Procedures  . Urinalysis    Educational handout given for uti  Terald Sleeper PA-C [facility] [address]  [place], [place] [address] [phone]   05/08/2016, 9:51 AM

## 2016-05-08 NOTE — Patient Instructions (Signed)

## 2016-05-10 LAB — URINALYSIS
BILIRUBIN UA: NEGATIVE
Glucose, UA: NEGATIVE
Ketones, UA: NEGATIVE
Leukocytes, UA: NEGATIVE
NITRITE UA: NEGATIVE
PH UA: 8.5 — AB (ref 5.0–7.5)
RBC UA: NEGATIVE
SPEC GRAV UA: 1.015 (ref 1.005–1.030)
Urobilinogen, Ur: 0.2 mg/dL (ref 0.2–1.0)

## 2016-05-11 ENCOUNTER — Telehealth: Payer: Self-pay | Admitting: Family Medicine

## 2016-05-11 NOTE — Telephone Encounter (Signed)
Yes it is okay to switch to generic Namenda and Aricept

## 2016-05-11 NOTE — Telephone Encounter (Signed)
Pharmacy aware that medication can be changed

## 2016-05-17 ENCOUNTER — Other Ambulatory Visit: Payer: Self-pay | Admitting: Family Medicine

## 2016-06-14 ENCOUNTER — Other Ambulatory Visit: Payer: Self-pay | Admitting: Family Medicine

## 2016-06-14 NOTE — Telephone Encounter (Signed)
Patient last seen 03/10/16, please advise and route to pools

## 2016-06-17 ENCOUNTER — Other Ambulatory Visit: Payer: Self-pay | Admitting: Family Medicine

## 2016-06-21 ENCOUNTER — Other Ambulatory Visit: Payer: Self-pay | Admitting: Family Medicine

## 2016-07-14 ENCOUNTER — Encounter: Payer: Self-pay | Admitting: Family Medicine

## 2016-07-14 ENCOUNTER — Ambulatory Visit (INDEPENDENT_AMBULATORY_CARE_PROVIDER_SITE_OTHER): Payer: Medicare Other | Admitting: Family Medicine

## 2016-07-14 ENCOUNTER — Ambulatory Visit (INDEPENDENT_AMBULATORY_CARE_PROVIDER_SITE_OTHER): Payer: Medicare Other

## 2016-07-14 VITALS — BP 172/85 | HR 71 | Temp 97.3°F | Ht 66.0 in | Wt 178.0 lb

## 2016-07-14 DIAGNOSIS — E559 Vitamin D deficiency, unspecified: Secondary | ICD-10-CM

## 2016-07-14 DIAGNOSIS — M5440 Lumbago with sciatica, unspecified side: Secondary | ICD-10-CM | POA: Diagnosis not present

## 2016-07-14 DIAGNOSIS — M545 Low back pain, unspecified: Secondary | ICD-10-CM

## 2016-07-14 DIAGNOSIS — E7800 Pure hypercholesterolemia, unspecified: Secondary | ICD-10-CM | POA: Diagnosis not present

## 2016-07-14 DIAGNOSIS — I10 Essential (primary) hypertension: Secondary | ICD-10-CM | POA: Diagnosis not present

## 2016-07-14 DIAGNOSIS — G8929 Other chronic pain: Secondary | ICD-10-CM | POA: Diagnosis not present

## 2016-07-14 LAB — URINALYSIS, COMPLETE
Bilirubin, UA: NEGATIVE
Glucose, UA: NEGATIVE
Ketones, UA: NEGATIVE
NITRITE UA: NEGATIVE
PH UA: 7 (ref 5.0–7.5)
Protein, UA: NEGATIVE
RBC, UA: NEGATIVE
Specific Gravity, UA: 1.01 (ref 1.005–1.030)
UUROB: 0.2 mg/dL (ref 0.2–1.0)

## 2016-07-14 LAB — MICROSCOPIC EXAMINATION
BACTERIA UA: NONE SEEN
RENAL EPITHEL UA: NONE SEEN /HPF

## 2016-07-14 NOTE — Progress Notes (Signed)
Subjective:    Patient ID: Traci Wheeler, female    DOB: 03/30/1928, 83 y.o.   MRN: [id number]  HPI Pt here for follow up and management of chronic medical problems which includes hyperlipidemia and hypertension. She is taking medications regularly.The patient has lost her husband in the past few months. She seems to be in pretty good spirits today. Her blood pressure has been up at times and she does complain of some low back pain. She has had several surgeries on her back. She has not had any recent injury or fall. The caregiver, Chanda Busing is with her today and she is with her most days of the week. Patient denies any chest pain or shortness of breath anymore than usual. Her bowels are moving regularly she has no complaints with her stomach as she has had in the past. She has not seen any blood in the stool or had any black tarry bowel movements. She is passing her water without problems.    Patient Active Problem List   Diagnosis Date Noted  . Malaise and fatigue 10/01/2013  . Hypertension 12/14/2012  . Hyperlipemia 12/14/2012  . Dementia 12/11/2012  . Depression 10/04/2012  . ALLERGIC RHINITIS 07/19/2007  . ASTHMA 07/19/2007  . ESOPHAGEAL REFLUX 07/19/2007   Outpatient Encounter Prescriptions as of 07/14/2016  Medication Sig  . albuterol (PROVENTIL HFA;VENTOLIN HFA) 108 (90 BASE) MCG/ACT inhaler Inhale 2 puffs into the lungs every 6 (six) hours as needed for wheezing.  Marland Kitchen amLODipine (NORVASC) 10 MG tablet TAKE (1) TABLET DAILY IN THE MORNING.  Marland Kitchen buPROPion (WELLBUTRIN XL) 300 MG 24 hr tablet TAKE 1 TABLET DAILY  . busPIRone (BUSPAR) 15 MG tablet TAKE 1 TABLET EVERY MORNING  . DULoxetine (CYMBALTA) 60 MG capsule TAKE (1) CAPSULE DAILY  . lamoTRIgine (LAMICTAL) 25 MG tablet TAKE  (1)  TABLET TWICE A DAY.  Marland Kitchen levothyroxine (SYNTHROID, LEVOTHROID) 50 MCG tablet TAKE 1 TABLET DAILY  . LORazepam (ATIVAN) 0.5 MG tablet TAKE ONE TABLET AT BEDTIME  . mirtazapine (REMERON) 30 MG tablet  TAKE ONE TABLET AT BEDTIME  . NAMZARIC 28-10 MG CP24 Take 1 capsule by mouth every evening.  . nitrofurantoin, macrocrystal-monohydrate, (MACROBID) 100 MG capsule Take 1 capsule (100 mg total) by mouth 2 (two) times daily. 1 po BId  . pantoprazole (PROTONIX) 40 MG tablet TAKE 1 TABLET ONCE A DAY  . SALINE NASAL MIST NA Place 1 spray into the nose daily as needed (for congestion).  . simvastatin (ZOCOR) 20 MG tablet TAKE ONE TABLET AT BEDTIME   No facility-administered encounter medications on file as of 07/14/2016.       Review of Systems  Constitutional: Negative.   HENT: Negative.   Eyes: Negative.   Respiratory: Negative.   Cardiovascular: Negative.   Gastrointestinal: Negative.   Endocrine: Negative.   Genitourinary: Negative.   Musculoskeletal: Positive for back pain (low ).  Skin: Negative.   Allergic/Immunologic: Negative.   Neurological: Negative.   Hematological: Negative.   Psychiatric/Behavioral: Negative.        Objective:   Physical Exam  Constitutional: She is oriented to person, place, and time. She appears well-developed and well-nourished. No distress.  The patient is pleasant and relaxed. We did not discuss the loss of her husband and she seemed to be in pretty good spirits.  HENT:  Head: Normocephalic and atraumatic.  Right Ear: External ear normal.  Left Ear: External ear normal.  Nose: Nose normal.  Mouth/Throat: Oropharynx is clear and moist. No oropharyngeal  exudate.  Eyes: Conjunctivae and EOM are normal. Pupils are equal, round, and reactive to light. Right eye exhibits no discharge. Left eye exhibits no discharge. No scleral icterus.  Neck: Normal range of motion. Neck supple. No thyromegaly present.  No bruits thyromegaly or anterior cervical adenopathy  Cardiovascular: Normal rate and regular rhythm.   No murmur heard. Pulmonary/Chest: Effort normal and breath sounds normal. No respiratory distress. She has no wheezes. She has no rales.    Abdominal: Soft. Bowel sounds are normal. She exhibits no mass. There is no tenderness. There is no rebound and no guarding.  The patient has a large umbilical hernia unchanged from the past. There is no liver or spleen enlargement and no masses palpable.  Musculoskeletal: Normal range of motion. She exhibits no edema or tenderness.  The patient did have some discomfort with laying down on the table and sitting up from the table. She also had some discomfort in the back with leg raising bilaterally.  Lymphadenopathy:    She has no cervical adenopathy.  Neurological: She is alert and oriented to person, place, and time. She has normal reflexes. No cranial nerve deficit.  Skin: Skin is warm and dry. No rash noted.  Psychiatric: She has a normal mood and affect. Her behavior is normal. Thought content normal.  Nursing note and vitals reviewed.  BP (!) 172/85 (BP Location: Right Arm)   Pulse 71   Temp 97.3 F (36.3 C) (Oral)   Ht '5\' 6"'$  (1.676 m)   Wt 178 lb (80.7 kg)   BMI 28.73 kg/m    Repeat blood pressure by the nurse LS spine films with results pending===     Assessment & Plan:  1. Essential hypertension -The blood pressure was elevated today. - BMP8+EGFR - CBC with Differential/Platelet - Hepatic function panel  2. Vitamin D deficiency -Continue current treatment pending results of lab work - CBC with Differential/Platelet - VITAMIN D 25 Hydroxy (Vit-D Deficiency, Fractures)  3. Pure hypercholesterolemia -Continue current treatment pending results of lab work - CBC with Differential/Platelet - Lipid panel  4. Bilateral low back pain with sciatica, sciatica laterality unspecified, unspecified chronicity -Take extra strength Tylenol as needed for pain and use warm wet compresses to the low back 20 minutes 3 or 4 times daily -Try to increase walking activity if possible - DG Lumbar Spine 2-3 Views; Future - Urinalysis, Complete - Urine culture  5. Chronic midline low  back pain without sciatica -Moist heat and Tylenol as needed for pain  No orders of the defined types were placed in this encounter.  Patient Instructions                       Medicare Annual Wellness Visit  [facility] and the medical providers at [facility] strive to bring you the best medical care.  In doing so we not only want to address your current medical conditions and concerns but also to detect new conditions early and prevent illness, disease and health-related problems.    Medicare offers a yearly Wellness Visit which allows our clinical staff to assess your need for preventative services including immunizations, lifestyle education, counseling to decrease risk of preventable diseases and screening for fall risk and other medical concerns.    This visit is provided free of charge (no copay) for all Medicare recipients. The clinical pharmacists at [facility] have begun to conduct these Wellness Visits which will also include a thorough review  of all your medications.    As you primary medical provider recommend that you make an appointment for your Annual Wellness Visit if you have not done so already this year.  You may set up this appointment before you leave today or you may call back ([phone]) and schedule an appointment.  Please make sure when you call that you mention that you are scheduling your Annual Wellness Visit with the clinical pharmacist so that the appointment may be made for the proper length of time.     Continue current medications. Continue good therapeutic lifestyle changes which include good diet and exercise. Fall precautions discussed with patient. If an FOBT was given today- please return it to our front desk. If you are over 43 years old - you may need Prevnar 11 or the adult Pneumonia vaccine.  **Flu shots are available--- please call and schedule a FLU-CLINIC appointment**  After your visit with [place] today  you will receive a survey in the mail or online from [employer] regarding your care with [place]. Please take a moment to fill this out. Your feedback is very important to [place] as you can help [place] better understand your patient needs as well as improve your experience and satisfaction. WE CARE ABOUT YOU!!!   Warm wet compresses 20 minutes 3 or 4 times daily Regular walking is good for the back We will call with the x-ray results as soon as these results become available In the meantime take extra strength Tylenol as needed for pain Continue to drink plenty of fluids and stay well hydrated Monitor blood pressure readings at home if possible  Arrie Senate MD

## 2016-07-14 NOTE — Patient Instructions (Addendum)
Medicare Annual Wellness Visit  [facility] and the medical providers at [facility] strive to bring you the best medical care.  In doing so we not only want to address your current medical conditions and concerns but also to detect new conditions early and prevent illness, disease and health-related problems.    Medicare offers a yearly Wellness Visit which allows our clinical staff to assess your need for preventative services including immunizations, lifestyle education, counseling to decrease risk of preventable diseases and screening for fall risk and other medical concerns.    This visit is provided free of charge (no copay) for all Medicare recipients. The clinical pharmacists at [facility] have begun to conduct these Wellness Visits which will also include a thorough review of all your medications.    As you primary medical provider recommend that you make an appointment for your Annual Wellness Visit if you have not done so already this year.  You may set up this appointment before you leave today or you may call back [phone] and schedule an appointment.  Please make sure when you call that you mention that you are scheduling your Annual Wellness Visit with the clinical pharmacist so that the appointment may be made for the proper length of time.     Continue current medications. Continue good therapeutic lifestyle changes which include good diet and exercise. Fall precautions discussed with patient. If an FOBT was given today- please return it to our front desk. If you are over 67 years old - you may need Prevnar 78 or the adult Pneumonia vaccine.  **Flu shots are available--- please call and schedule a FLU-CLINIC appointment**  After your visit with [place] today you will receive a survey in the mail or online from [employer] regarding your care with [place]. Please take a moment to fill this out. Your feedback is very  important to [place] as you can help [place] better understand your patient needs as well as improve your experience and satisfaction. WE CARE ABOUT YOU!!!   Warm wet compresses 20 minutes 3 or 4 times daily Regular walking is good for the back We will call with the x-ray results as soon as these results become available In the meantime take extra strength Tylenol as needed for pain Continue to drink plenty of fluids and stay well hydrated Monitor blood pressure readings at home if possible

## 2016-07-15 LAB — BMP8+EGFR
BUN/Creatinine Ratio: 12 (ref 12–28)
BUN: 17 mg/dL (ref 8–27)
CO2: 28 mmol/L (ref 18–29)
CREATININE: 1.42 mg/dL — AB (ref 0.57–1.00)
Calcium: 10.3 mg/dL (ref 8.7–10.3)
Chloride: 97 mmol/L (ref 96–106)
GFR calc Af Amer: 39 mL/min/{1.73_m2} — ABNORMAL LOW (ref >59)
GFR calc non Af Amer: 33 mL/min/{1.73_m2} — ABNORMAL LOW (ref >59)
GLUCOSE: 94 mg/dL (ref 65–99)
Potassium: 5.5 mmol/L — ABNORMAL HIGH (ref 3.5–5.2)
SODIUM: 140 mmol/L (ref 134–144)

## 2016-07-15 LAB — CBC WITH DIFFERENTIAL/PLATELET
Basophils Absolute: 0.1 10*3/uL (ref 0.0–0.2)
Basos: 1 %
EOS (ABSOLUTE): 0.3 10*3/uL (ref 0.0–0.4)
EOS: 4 %
HEMATOCRIT: 38.9 % (ref 34.0–46.6)
Hemoglobin: 13.2 g/dL (ref 11.1–15.9)
IMMATURE GRANULOCYTES: 0 %
Immature Grans (Abs): 0 10*3/uL (ref 0.0–0.1)
Lymphocytes Absolute: 2.5 10*3/uL (ref 0.7–3.1)
Lymphs: 35 %
MCH: 30.4 pg (ref 26.6–33.0)
MCHC: 33.9 g/dL (ref 31.5–35.7)
MCV: 90 fL (ref 79–97)
MONOS ABS: 0.7 10*3/uL (ref 0.1–0.9)
Monocytes: 10 %
NEUTROS PCT: 50 %
Neutrophils Absolute: 3.7 10*3/uL (ref 1.4–7.0)
Platelets: 428 10*3/uL — ABNORMAL HIGH (ref 150–379)
RBC: 4.34 x10E6/uL (ref 3.77–5.28)
RDW: 14.6 % (ref 12.3–15.4)
WBC: 7.3 10*3/uL (ref 3.4–10.8)

## 2016-07-15 LAB — LIPID PANEL
CHOL/HDL RATIO: 3.7 ratio (ref 0.0–4.4)
Cholesterol, Total: 202 mg/dL — ABNORMAL HIGH (ref 100–199)
HDL: 55 mg/dL (ref >39)
LDL CALC: 83 mg/dL (ref 0–99)
TRIGLYCERIDES: 320 mg/dL — AB (ref 0–149)
VLDL CHOLESTEROL CAL: 64 mg/dL — AB (ref 5–40)

## 2016-07-15 LAB — HEPATIC FUNCTION PANEL
ALBUMIN: 4.6 g/dL (ref 3.5–4.7)
ALT: 10 IU/L (ref 0–32)
AST: 17 IU/L (ref 0–40)
Alkaline Phosphatase: 104 IU/L (ref 39–117)
BILIRUBIN TOTAL: 0.3 mg/dL (ref 0.0–1.2)
Bilirubin, Direct: 0.09 mg/dL (ref 0.00–0.40)
TOTAL PROTEIN: 7.1 g/dL (ref 6.0–8.5)

## 2016-07-15 LAB — VITAMIN D 25 HYDROXY (VIT D DEFICIENCY, FRACTURES): Vit D, 25-Hydroxy: 37 ng/mL (ref 30.0–100.0)

## 2016-07-16 ENCOUNTER — Other Ambulatory Visit: Payer: Self-pay | Admitting: Family Medicine

## 2016-07-16 NOTE — Telephone Encounter (Signed)
Not sure if pt should be on both Aricept and Namenda.

## 2016-07-17 LAB — URINE CULTURE

## 2016-07-19 ENCOUNTER — Other Ambulatory Visit: Payer: Self-pay | Admitting: Family Medicine

## 2016-07-26 ENCOUNTER — Other Ambulatory Visit: Payer: Medicare Other

## 2016-07-26 DIAGNOSIS — E875 Hyperkalemia: Secondary | ICD-10-CM

## 2016-07-26 LAB — BMP8+EGFR
BUN/Creatinine Ratio: 9 — ABNORMAL LOW (ref 12–28)
BUN: 12 mg/dL (ref 8–27)
CALCIUM: 9.8 mg/dL (ref 8.7–10.3)
CHLORIDE: 102 mmol/L (ref 96–106)
CO2: 27 mmol/L (ref 18–29)
Creatinine, Ser: 1.4 mg/dL — ABNORMAL HIGH (ref 0.57–1.00)
GFR calc non Af Amer: 34 mL/min/{1.73_m2} — ABNORMAL LOW (ref >59)
GFR, EST AFRICAN AMERICAN: 39 mL/min/{1.73_m2} — AB (ref >59)
Glucose: 83 mg/dL (ref 65–99)
POTASSIUM: 4.3 mmol/L (ref 3.5–5.2)
Sodium: 146 mmol/L — ABNORMAL HIGH (ref 134–144)

## 2016-07-30 ENCOUNTER — Other Ambulatory Visit: Payer: Self-pay | Admitting: Family Medicine

## 2016-08-16 ENCOUNTER — Other Ambulatory Visit: Payer: Self-pay | Admitting: Family Medicine

## 2016-08-21 ENCOUNTER — Other Ambulatory Visit: Payer: Self-pay | Admitting: Family Medicine

## 2016-08-21 DIAGNOSIS — J069 Acute upper respiratory infection, unspecified: Secondary | ICD-10-CM | POA: Diagnosis not present

## 2016-09-06 ENCOUNTER — Other Ambulatory Visit: Payer: Self-pay | Admitting: Family Medicine

## 2016-09-14 ENCOUNTER — Other Ambulatory Visit: Payer: Self-pay | Admitting: Family Medicine

## 2016-10-04 ENCOUNTER — Other Ambulatory Visit: Payer: Self-pay | Admitting: Family Medicine

## 2016-10-05 ENCOUNTER — Other Ambulatory Visit: Payer: Self-pay | Admitting: Family Medicine

## 2016-10-11 ENCOUNTER — Other Ambulatory Visit: Payer: Self-pay | Admitting: Family Medicine

## 2016-10-12 NOTE — Telephone Encounter (Signed)
rx called into pharmacy

## 2016-10-12 NOTE — Telephone Encounter (Signed)
Please call in lorazepam with 1 refills 

## 2016-10-13 ENCOUNTER — Encounter: Payer: Self-pay | Admitting: *Deleted

## 2016-10-18 ENCOUNTER — Other Ambulatory Visit: Payer: Self-pay | Admitting: Family Medicine

## 2016-10-25 ENCOUNTER — Other Ambulatory Visit: Payer: Self-pay | Admitting: Family Medicine

## 2016-11-09 ENCOUNTER — Ambulatory Visit: Payer: Medicare Other | Admitting: Family Medicine

## 2016-11-10 ENCOUNTER — Other Ambulatory Visit: Payer: Self-pay | Admitting: Family Medicine

## 2016-11-12 ENCOUNTER — Ambulatory Visit: Payer: Medicare Other | Admitting: Family Medicine

## 2016-11-15 ENCOUNTER — Other Ambulatory Visit: Payer: Self-pay | Admitting: Family Medicine

## 2016-11-18 ENCOUNTER — Ambulatory Visit: Payer: Medicare Other | Admitting: Family Medicine

## 2016-11-19 ENCOUNTER — Other Ambulatory Visit: Payer: Self-pay | Admitting: Family Medicine

## 2016-11-22 ENCOUNTER — Other Ambulatory Visit: Payer: Self-pay | Admitting: Family Medicine

## 2016-12-09 ENCOUNTER — Encounter: Payer: Self-pay | Admitting: Family Medicine

## 2016-12-09 ENCOUNTER — Ambulatory Visit (INDEPENDENT_AMBULATORY_CARE_PROVIDER_SITE_OTHER): Payer: Medicare Other | Admitting: Family Medicine

## 2016-12-09 VITALS — BP 162/80 | HR 70 | Temp 97.3°F | Ht 66.0 in | Wt 181.0 lb

## 2016-12-09 DIAGNOSIS — E039 Hypothyroidism, unspecified: Secondary | ICD-10-CM | POA: Diagnosis not present

## 2016-12-09 DIAGNOSIS — I10 Essential (primary) hypertension: Secondary | ICD-10-CM

## 2016-12-09 DIAGNOSIS — M545 Low back pain, unspecified: Secondary | ICD-10-CM

## 2016-12-09 DIAGNOSIS — E559 Vitamin D deficiency, unspecified: Secondary | ICD-10-CM

## 2016-12-09 DIAGNOSIS — F0150 Vascular dementia without behavioral disturbance: Secondary | ICD-10-CM

## 2016-12-09 DIAGNOSIS — F418 Other specified anxiety disorders: Secondary | ICD-10-CM

## 2016-12-09 DIAGNOSIS — E7800 Pure hypercholesterolemia, unspecified: Secondary | ICD-10-CM | POA: Diagnosis not present

## 2016-12-09 DIAGNOSIS — G8929 Other chronic pain: Secondary | ICD-10-CM | POA: Diagnosis not present

## 2016-12-09 NOTE — Progress Notes (Signed)
Subjective:    Patient ID: Traci Wheeler, female    DOB: Jan 28, 1928, 83 y.o.   MRN: [id number]  HPI  Pt here for follow up and management of chronic medical problems which includes hyperlipidemia and hypertension. She is taking medication regularly.The patient comes with her caregiver to the office today. She has care off and on during the day. They monitor her and she asked he seems to be very stable and in very good spirits other than her complaints with her ongoing back pain which she's had for years. She denies any chest pain or shortness of breath. She denies any problems with swallowing heartburn indigestion nausea vomiting diarrhea or blood in the stool and has a good appetite. She is passing her water without problems. She is in good spirits today.    Patient Active Problem List   Diagnosis Date Noted  . Malaise and fatigue 10/01/2013  . Hypertension 12/14/2012  . Hyperlipemia 12/14/2012  . Dementia 12/11/2012  . Depression 10/04/2012  . ALLERGIC RHINITIS 07/19/2007  . ASTHMA 07/19/2007  . ESOPHAGEAL REFLUX 07/19/2007   Outpatient Encounter Prescriptions as of 12/09/2016  Medication Sig  . albuterol (PROVENTIL HFA;VENTOLIN HFA) 108 (90 BASE) MCG/ACT inhaler Inhale 2 puffs into the lungs every 6 (six) hours as needed for wheezing.  Marland Kitchen amLODipine (NORVASC) 10 MG tablet TAKE (1) TABLET DAILY IN THE MORNING.  Marland Kitchen buPROPion (WELLBUTRIN XL) 300 MG 24 hr tablet TAKE 1 TABLET DAILY  . busPIRone (BUSPAR) 15 MG tablet TAKE 1 TABLET EVERY MORNING  . donepezil (ARICEPT) 10 MG tablet TAKE ONE TABLET AT BEDTIME  . DULoxetine (CYMBALTA) 60 MG capsule TAKE (1) CAPSULE DAILY  . lamoTRIgine (LAMICTAL) 25 MG tablet TAKE  (1)  TABLET TWICE A DAY.  Marland Kitchen levothyroxine (SYNTHROID, LEVOTHROID) 50 MCG tablet TAKE 1 TABLET DAILY  . LORazepam (ATIVAN) 0.5 MG tablet TAKE ONE TABLET AT BEDTIME  . memantine (NAMENDA) 10 MG tablet TAKE (1) TABLET TWICE A DAY.  . mirtazapine (REMERON) 30 MG tablet TAKE ONE  TABLET AT BEDTIME  . pantoprazole (PROTONIX) 40 MG tablet TAKE 1 TABLET ONCE A DAY  . SALINE NASAL MIST NA Place 1 spray into the nose daily as needed (for congestion).  . simvastatin (ZOCOR) 20 MG tablet TAKE ONE TABLET AT BEDTIME  . [DISCONTINUED] NAMZARIC 28-10 MG CP24 Take 1 capsule by mouth every evening.  . [DISCONTINUED] nitrofurantoin, macrocrystal-monohydrate, (MACROBID) 100 MG capsule Take 1 capsule (100 mg total) by mouth 2 (two) times daily. 1 po BId   No facility-administered encounter medications on file as of 12/09/2016.       Review of Systems  Constitutional: Negative.   HENT: Negative.   Eyes: Negative.   Respiratory: Negative.   Cardiovascular: Negative.   Gastrointestinal: Negative.   Endocrine: Negative.   Genitourinary: Negative.   Musculoskeletal: Negative.   Skin: Negative.   Allergic/Immunologic: Negative.   Neurological: Negative.   Hematological: Negative.   Psychiatric/Behavioral: Negative.        Objective:   Physical Exam  Constitutional: She is oriented to person, place, and time. She appears well-developed and well-nourished. No distress.  The patient is in good spirits somewhat hard of hearing but responding appropriately to questions asked of her.  HENT:  Head: Normocephalic and atraumatic.  Right Ear: External ear normal.  Left Ear: External ear normal.  Nose: Nose normal.  Mouth/Throat: Oropharynx is clear and moist.  Eyes: Conjunctivae and EOM are normal. Pupils are equal, round, and reactive to light. Right eye  exhibits no discharge. Left eye exhibits no discharge. No scleral icterus.  Neck: Normal range of motion. Neck supple. No thyromegaly present.  No bruits thyromegaly or anterior cervical adenopathy  Cardiovascular: Normal rate, regular rhythm, normal heart sounds and intact distal pulses.   No murmur heard. The heart was regular today at 72/m  Pulmonary/Chest: Effort normal and breath sounds normal. No respiratory distress. She  has no wheezes. She has no rales.  Clear anteriorly and posteriorly and no wheezes  Abdominal: Soft. Bowel sounds are normal. She exhibits no mass. There is no tenderness. There is no rebound and no guarding.  Large umbilical hernia that she is had for years and is not giving her any problems. No liver or spleen enlargement. No bruits and no masses palpable.  Musculoskeletal: Normal range of motion. She exhibits no edema.  Lymphadenopathy:    She has no cervical adenopathy.  Neurological: She is alert and oriented to person, place, and time.  Skin: Skin is warm and dry. No rash noted.  Psychiatric: She has a normal mood and affect. Her behavior is normal. Judgment and thought content normal.  The patient has some diminished memory but acted appropriately and responded appropriately to questions asked of her and questions asked of her caregiver.  Nursing note and vitals reviewed.  BP (!) 162/79 (BP Location: Left Arm)   Pulse 70   Temp 97.3 F (36.3 C) (Oral)   Ht 5' 6"  (1.676 m)   Wt 181 lb (82.1 kg)   BMI 29.21 kg/m    Repeat blood pressure 162/80 right arm sitting large cuff     Assessment & Plan:  1. Vitamin D deficiency -Continue current treatment pending results of lab work - CBC with Differential/Platelet - VITAMIN D 25 Hydroxy (Vit-D Deficiency, Fractures)  2. Essential hypertension -The blood pressure was elevated on 2 different occasions today. The caregiver will get some readings from home and bring these readings by with the patient and a couple of weeks and have her blood pressure rechecked here at that time. - BMP8+EGFR - CBC with Differential/Platelet - Hepatic function panel  3. Pure hypercholesterolemia -Continue aggressive therapeutic lifestyle changes and simvastatin pending results of lab work - CBC with Differential/Platelet - Lipid panel  4. Vascular dementia without behavioral disturbance -This seems to be stable. The patient seems to be stable and on  a routine at home that is working out well for her with her caregivers and the fact that she is provided food an oversight. -Continue with Aricept and Namenda. -Continue with her antidepressant and antianxiety medicines. - CBC with Differential/Platelet  5. Depression with anxiety -This also seems good and she will continue with her Cymbalta and Lamictal - CBC with Differential/Platelet  6. Hypothyroidism, unspecified type -Continue current therapy pending results of lab work - CBC with Differential/Platelet - Thyroid Panel With TSH  7. Chronic midline low back pain without sciatica -This is an ongoing problem and she will continue with her current treatment.  Patient Instructions                       Medicare Annual Wellness Visit  [facility] and the medical providers at [facility] strive to bring you the best medical care.  In doing so we not only want to address your current medical conditions and concerns but also to detect new conditions early and prevent illness, disease and health-related problems.    Medicare offers a yearly Wellness Visit which  allows our clinical staff to assess your need for preventative services including immunizations, lifestyle education, counseling to decrease risk of preventable diseases and screening for fall risk and other medical concerns.    This visit is provided free of charge (no copay) for all Medicare recipients. The clinical pharmacists at [facility] have begun to conduct these Wellness Visits which will also include a thorough review of all your medications.    As you primary medical provider recommend that you make an appointment for your Annual Wellness Visit if you have not done so already this year.  You may set up this appointment before you leave today or you may call back ([phone]) and schedule an appointment.  Please make sure when you call that you mention that you are scheduling your  Annual Wellness Visit with the clinical pharmacist so that the appointment may be made for the proper length of time.     Continue current medications. Continue good therapeutic lifestyle changes which include good diet and exercise. Fall precautions discussed with patient. If an FOBT was given today- please return it to our front desk. If you are over 31 years old - you may need Prevnar 33 or the adult Pneumonia vaccine.  **Flu shots are available--- please call and schedule a FLU-CLINIC appointment**  After your visit with [place] today you will receive a survey in the mail or online from [employer] regarding your care with [place]. Please take a moment to fill this out. Your feedback is very important to [place] as you can help [place] better understand your patient needs as well as improve your experience and satisfaction. WE CARE ABOUT YOU!!!   Stay active and drink plenty of fluids Always be careful and did not put yourself at risk for falling Take medication regularly Continue with current medications  Arrie Senate MD

## 2016-12-09 NOTE — Patient Instructions (Addendum)
Medicare Annual Wellness Visit  [facility] and the medical providers at [facility] strive to bring you the best medical care.  In doing so we not only want to address your current medical conditions and concerns but also to detect new conditions early and prevent illness, disease and health-related problems.    Medicare offers a yearly Wellness Visit which allows our clinical staff to assess your need for preventative services including immunizations, lifestyle education, counseling to decrease risk of preventable diseases and screening for fall risk and other medical concerns.    This visit is provided free of charge (no copay) for all Medicare recipients. The clinical pharmacists at [facility] have begun to conduct these Wellness Visits which will also include a thorough review of all your medications.    As you primary medical provider recommend that you make an appointment for your Annual Wellness Visit if you have not done so already this year.  You may set up this appointment before you leave today or you may call back [phone]) and schedule an appointment.  Please make sure when you call that you mention that you are scheduling your Annual Wellness Visit with the clinical pharmacist so that the appointment may be made for the proper length of time.     Continue current medications. Continue good therapeutic lifestyle changes which include good diet and exercise. Fall precautions discussed with patient. If an FOBT was given today- please return it to our front desk. If you are over 20 years old - you may need Prevnar 22 or the adult Pneumonia vaccine.  **Flu shots are available--- please call and schedule a FLU-CLINIC appointment**  After your visit with [place] today you will receive a survey in the mail or online from [employer] regarding your care with [place]. Please take a moment to fill this out. Your feedback is very  important to [place] as you can help [place] better understand your patient needs as well as improve your experience and satisfaction. WE CARE ABOUT YOU!!!   Stay active and drink plenty of fluids Always be careful and did not put yourself at risk for falling Take medication regularly Continue with current medications Come by the office in the next couple weeks and let the nurse checked the blood pressure and bring blood pressure readings from home At that time we may need to increase some of her blood pressure medication but we will wait until we see those readings and recheck readings here before we do that.

## 2016-12-10 ENCOUNTER — Encounter: Payer: Self-pay | Admitting: Family Medicine

## 2016-12-10 DIAGNOSIS — D75839 Thrombocytosis, unspecified: Secondary | ICD-10-CM | POA: Insufficient documentation

## 2016-12-10 DIAGNOSIS — D473 Essential (hemorrhagic) thrombocythemia: Secondary | ICD-10-CM | POA: Insufficient documentation

## 2016-12-10 LAB — HEPATIC FUNCTION PANEL
ALK PHOS: 111 IU/L (ref 39–117)
ALT: 9 IU/L (ref 0–32)
AST: 15 IU/L (ref 0–40)
Albumin: 4.2 g/dL (ref 3.5–4.7)
Bilirubin Total: 0.2 mg/dL (ref 0.0–1.2)
Bilirubin, Direct: 0.09 mg/dL (ref 0.00–0.40)
TOTAL PROTEIN: 7 g/dL (ref 6.0–8.5)

## 2016-12-10 LAB — BMP8+EGFR
BUN / CREAT RATIO: 8 — AB (ref 12–28)
BUN: 11 mg/dL (ref 8–27)
CHLORIDE: 99 mmol/L (ref 96–106)
CO2: 27 mmol/L (ref 18–29)
Calcium: 10.2 mg/dL (ref 8.7–10.3)
Creatinine, Ser: 1.32 mg/dL — ABNORMAL HIGH (ref 0.57–1.00)
GFR calc Af Amer: 42 mL/min/{1.73_m2} — ABNORMAL LOW (ref >59)
GFR calc non Af Amer: 37 mL/min/{1.73_m2} — ABNORMAL LOW (ref >59)
GLUCOSE: 95 mg/dL (ref 65–99)
POTASSIUM: 4.6 mmol/L (ref 3.5–5.2)
Sodium: 143 mmol/L (ref 134–144)

## 2016-12-10 LAB — CBC WITH DIFFERENTIAL/PLATELET
BASOS ABS: 0 10*3/uL (ref 0.0–0.2)
Basos: 1 %
EOS (ABSOLUTE): 0.4 10*3/uL (ref 0.0–0.4)
Eos: 5 %
Hematocrit: 39.5 % (ref 34.0–46.6)
Hemoglobin: 12.6 g/dL (ref 11.1–15.9)
Immature Grans (Abs): 0 10*3/uL (ref 0.0–0.1)
Immature Granulocytes: 0 %
LYMPHS ABS: 2 10*3/uL (ref 0.7–3.1)
Lymphs: 27 %
MCH: 29.6 pg (ref 26.6–33.0)
MCHC: 31.9 g/dL (ref 31.5–35.7)
MCV: 93 fL (ref 79–97)
MONOCYTES: 11 %
MONOS ABS: 0.8 10*3/uL (ref 0.1–0.9)
NEUTROS ABS: 4.1 10*3/uL (ref 1.4–7.0)
Neutrophils: 56 %
PLATELETS: 403 10*3/uL — AB (ref 150–379)
RBC: 4.25 x10E6/uL (ref 3.77–5.28)
RDW: 14.5 % (ref 12.3–15.4)
WBC: 7.3 10*3/uL (ref 3.4–10.8)

## 2016-12-10 LAB — LIPID PANEL
CHOLESTEROL TOTAL: 188 mg/dL (ref 100–199)
Chol/HDL Ratio: 3.8 ratio (ref 0.0–4.4)
HDL: 49 mg/dL (ref >39)
LDL Calculated: 70 mg/dL (ref 0–99)
TRIGLYCERIDES: 344 mg/dL — AB (ref 0–149)
VLDL Cholesterol Cal: 69 mg/dL — ABNORMAL HIGH (ref 5–40)

## 2016-12-10 LAB — THYROID PANEL WITH TSH
FREE THYROXINE INDEX: 2 (ref 1.2–4.9)
T3 Uptake Ratio: 24 % (ref 24–39)
T4 TOTAL: 8.5 ug/dL (ref 4.5–12.0)
TSH: 2.48 u[IU]/mL (ref 0.450–4.500)

## 2016-12-10 LAB — VITAMIN D 25 HYDROXY (VIT D DEFICIENCY, FRACTURES): Vit D, 25-Hydroxy: 26.2 ng/mL — ABNORMAL LOW (ref 30.0–100.0)

## 2016-12-14 ENCOUNTER — Other Ambulatory Visit: Payer: Self-pay | Admitting: Family Medicine

## 2016-12-16 NOTE — Telephone Encounter (Signed)
Rx called in to pharmacy. 

## 2016-12-16 NOTE — Telephone Encounter (Signed)
Ativan last filled 11/15/16, last seen 12/09/16. Call in

## 2016-12-16 NOTE — Telephone Encounter (Signed)
Please call in lorazepam with 0 refills 

## 2016-12-27 ENCOUNTER — Other Ambulatory Visit: Payer: Self-pay | Admitting: Family Medicine

## 2016-12-29 ENCOUNTER — Ambulatory Visit (INDEPENDENT_AMBULATORY_CARE_PROVIDER_SITE_OTHER): Payer: Medicare Other

## 2016-12-29 VITALS — BP 163/82 | HR 73

## 2016-12-29 DIAGNOSIS — I10 Essential (primary) hypertension: Secondary | ICD-10-CM

## 2016-12-29 NOTE — Progress Notes (Signed)
Add HCTZ 12.5 mg #30 one daily Recheck BMP in 2 weeks and another blood pressure by the nurse in 2 weeks

## 2016-12-29 NOTE — Progress Notes (Signed)
Patient presents to the office today with caretakers for a BP check. Brought a blood pressure flow sheet with her with BPs from home that all seem a little elevated. BP today was 163/82 with a pulse of 73. Will let MD review readings and advise.

## 2017-01-17 ENCOUNTER — Other Ambulatory Visit: Payer: Self-pay | Admitting: Family Medicine

## 2017-01-17 ENCOUNTER — Other Ambulatory Visit: Payer: Self-pay | Admitting: Nurse Practitioner

## 2017-01-18 NOTE — Telephone Encounter (Signed)
Last seen 12/09/16  DWM  IF approved route to nurse to call into [facility]

## 2017-01-18 NOTE — Telephone Encounter (Signed)
Please call in lorazepam with 1 refills 

## 2017-01-18 NOTE — Telephone Encounter (Signed)
Rx called to pharmacy

## 2017-01-21 ENCOUNTER — Other Ambulatory Visit: Payer: Self-pay | Admitting: Family Medicine

## 2017-02-04 ENCOUNTER — Other Ambulatory Visit: Payer: Self-pay | Admitting: Family Medicine

## 2017-02-14 ENCOUNTER — Ambulatory Visit (INDEPENDENT_AMBULATORY_CARE_PROVIDER_SITE_OTHER): Payer: Medicare Other | Admitting: Family

## 2017-02-14 ENCOUNTER — Encounter: Payer: Self-pay | Admitting: Family

## 2017-02-14 VITALS — BP 162/80 | HR 78 | Temp 97.3°F | Ht 66.0 in | Wt 181.0 lb

## 2017-02-14 DIAGNOSIS — R61 Generalized hyperhidrosis: Secondary | ICD-10-CM

## 2017-02-14 DIAGNOSIS — K5900 Constipation, unspecified: Secondary | ICD-10-CM

## 2017-02-14 MED ORDER — POLYETHYLENE GLYCOL 3350 17 GM/SCOOP PO POWD: 17.0000 g | Freq: Every day | ORAL | 1 refills | Status: AC

## 2017-02-14 NOTE — Patient Instructions (Signed)

## 2017-02-14 NOTE — Progress Notes (Signed)
   Subjective:    Patient ID: Traci Wheeler, female    DOB: 1927-08-11, 83 y.o.   MRN: [id number]  HPI Pt presents to the office today with "night sweats" for a week. Pt has dementia and is a poor historian, pt's nephew is present and neighbor who see's pt daily.   Pt's neighbor states "every time she has a bowel movement she breaks out in a sweat and says she is going to die".    Review of Systems  Constitutional: Positive for chills.  Psychiatric/Behavioral: Positive for confusion. The patient is not nervous/anxious.   All other systems reviewed and are negative.      Objective:   Physical Exam  Constitutional: She is oriented to person, place, and time. She appears well-developed and well-nourished. No distress.  HENT:  Head: Normocephalic.  Eyes: Pupils are equal, round, and reactive to light.  Neck: Normal range of motion. Neck supple. No thyromegaly present.  Cardiovascular: Normal rate, regular rhythm, normal heart sounds and intact distal pulses.   No murmur heard. Pulmonary/Chest: Effort normal and breath sounds normal. No respiratory distress. She has no wheezes.  Abdominal: Soft. Bowel sounds are normal. She exhibits no distension. There is no tenderness.  Musculoskeletal: Normal range of motion. She exhibits no edema or tenderness.  Neurological: She is alert and oriented to person, place, and time.  Skin: Skin is warm and dry.  Psychiatric: She has a normal mood and affect. Her behavior is normal. Judgment and thought content normal. Cognition and memory are impaired. She exhibits abnormal recent memory and abnormal remote memory.  Vitals reviewed.    BP (!) 162/80   Pulse 78   Temp (!) 97.3 F (36.3 C) (Oral)   Ht 5' 6" (1.676 m)   Wt 181 lb (82.1 kg)   BMI 29.21 kg/m      Assessment & Plan:  1. Night sweat - Urinalysis, Complete - Urine Culture - BMP8+EGFR - CBC with Differential/Platelet  2. Constipation, unspecified constipation type -  BMP8+EGFR - CBC with Differential/Platelet - polyethylene glycol powder (GLYCOLAX/MIRALAX) powder; Take 17 g by mouth daily.  Dispense: 3350 g; Refill: 1  Urine pending  Labs pending Force fluids Start Miralax daily or every other day Encourage health diet RTO prn   Evelina Dun, FNP

## 2017-02-15 DIAGNOSIS — R61 Generalized hyperhidrosis: Secondary | ICD-10-CM | POA: Diagnosis not present

## 2017-02-15 LAB — MICROSCOPIC EXAMINATION
Epithelial Cells (non renal): >10 /hpf — AB (ref 0–10)
RBC MICROSCOPIC, UA: NONE SEEN /HPF (ref 0–?)

## 2017-02-15 LAB — CBC WITH DIFFERENTIAL/PLATELET
BASOS ABS: 0.1 10*3/uL (ref 0.0–0.2)
Basos: 1 %
EOS (ABSOLUTE): 0.2 10*3/uL (ref 0.0–0.4)
Eos: 2 %
HEMOGLOBIN: 13 g/dL (ref 11.1–15.9)
Hematocrit: 40.9 % (ref 34.0–46.6)
Immature Grans (Abs): 0 10*3/uL (ref 0.0–0.1)
Immature Granulocytes: 0 %
LYMPHS ABS: 2 10*3/uL (ref 0.7–3.1)
Lymphs: 25 %
MCH: 29.5 pg (ref 26.6–33.0)
MCHC: 31.8 g/dL (ref 31.5–35.7)
MCV: 93 fL (ref 79–97)
MONOCYTES: 8 %
Monocytes Absolute: 0.7 10*3/uL (ref 0.1–0.9)
NEUTROS ABS: 5.2 10*3/uL (ref 1.4–7.0)
NEUTROS PCT: 64 %
PLATELETS: 474 10*3/uL — AB (ref 150–379)
RBC: 4.4 x10E6/uL (ref 3.77–5.28)
RDW: 14.1 % (ref 12.3–15.4)
WBC: 8.1 10*3/uL (ref 3.4–10.8)

## 2017-02-15 LAB — BMP8+EGFR
BUN / CREAT RATIO: 9 — AB (ref 12–28)
BUN: 16 mg/dL (ref 8–27)
CO2: 24 mmol/L (ref 20–29)
CREATININE: 1.74 mg/dL — AB (ref 0.57–1.00)
Calcium: 10.3 mg/dL (ref 8.7–10.3)
Chloride: 103 mmol/L (ref 96–106)
GFR calc Af Amer: 30 mL/min/{1.73_m2} — ABNORMAL LOW (ref >59)
GFR calc non Af Amer: 26 mL/min/{1.73_m2} — ABNORMAL LOW (ref >59)
GLUCOSE: 81 mg/dL (ref 65–99)
Potassium: 4.5 mmol/L (ref 3.5–5.2)
Sodium: 142 mmol/L (ref 134–144)

## 2017-02-15 LAB — URINALYSIS, COMPLETE
Bilirubin, UA: NEGATIVE
Glucose, UA: NEGATIVE
Leukocytes, UA: NEGATIVE
Nitrite, UA: NEGATIVE
PH UA: 7 (ref 5.0–7.5)
RBC, UA: NEGATIVE
Specific Gravity, UA: 1.015 (ref 1.005–1.030)
UUROB: 0.2 mg/dL (ref 0.2–1.0)

## 2017-02-16 LAB — URINE CULTURE

## 2017-02-17 ENCOUNTER — Telehealth: Payer: Self-pay | Admitting: Family Medicine

## 2017-02-17 NOTE — Telephone Encounter (Signed)
Aware.  Results on lab work should be available later today.

## 2017-03-16 ENCOUNTER — Other Ambulatory Visit: Payer: Self-pay | Admitting: Nurse Practitioner

## 2017-03-21 ENCOUNTER — Other Ambulatory Visit: Payer: Self-pay | Admitting: Nurse Practitioner

## 2017-03-28 ENCOUNTER — Other Ambulatory Visit: Payer: Self-pay | Admitting: Nurse Practitioner

## 2017-03-28 ENCOUNTER — Other Ambulatory Visit: Payer: Self-pay | Admitting: Family Medicine

## 2017-03-31 DIAGNOSIS — M532X7 Spinal instabilities, lumbosacral region: Secondary | ICD-10-CM | POA: Diagnosis not present

## 2017-04-08 DIAGNOSIS — M545 Low back pain: Secondary | ICD-10-CM | POA: Diagnosis not present

## 2017-04-08 DIAGNOSIS — M6281 Muscle weakness (generalized): Secondary | ICD-10-CM | POA: Diagnosis not present

## 2017-04-13 ENCOUNTER — Encounter: Payer: Self-pay | Admitting: Family Medicine

## 2017-04-13 ENCOUNTER — Ambulatory Visit (INDEPENDENT_AMBULATORY_CARE_PROVIDER_SITE_OTHER): Payer: Medicare Other | Admitting: Family Medicine

## 2017-04-13 ENCOUNTER — Ambulatory Visit (INDEPENDENT_AMBULATORY_CARE_PROVIDER_SITE_OTHER): Payer: Medicare Other

## 2017-04-13 ENCOUNTER — Ambulatory Visit: Payer: Medicare Other | Admitting: Family Medicine

## 2017-04-13 VITALS — BP 159/84 | HR 71 | Temp 97.4°F | Ht 66.0 in | Wt 181.0 lb

## 2017-04-13 DIAGNOSIS — Z780 Asymptomatic menopausal state: Secondary | ICD-10-CM

## 2017-04-13 DIAGNOSIS — E7800 Pure hypercholesterolemia, unspecified: Secondary | ICD-10-CM | POA: Diagnosis not present

## 2017-04-13 DIAGNOSIS — Z13820 Encounter for screening for osteoporosis: Secondary | ICD-10-CM

## 2017-04-13 DIAGNOSIS — E559 Vitamin D deficiency, unspecified: Secondary | ICD-10-CM | POA: Diagnosis not present

## 2017-04-13 DIAGNOSIS — M4716 Other spondylosis with myelopathy, lumbar region: Secondary | ICD-10-CM

## 2017-04-13 DIAGNOSIS — Z23 Encounter for immunization: Secondary | ICD-10-CM | POA: Diagnosis not present

## 2017-04-13 DIAGNOSIS — E039 Hypothyroidism, unspecified: Secondary | ICD-10-CM | POA: Diagnosis not present

## 2017-04-13 DIAGNOSIS — I10 Essential (primary) hypertension: Secondary | ICD-10-CM

## 2017-04-13 DIAGNOSIS — F0150 Vascular dementia without behavioral disturbance: Secondary | ICD-10-CM

## 2017-04-13 DIAGNOSIS — M4310 Spondylolisthesis, site unspecified: Secondary | ICD-10-CM

## 2017-04-13 NOTE — Patient Instructions (Addendum)
Medicare Annual Wellness Visit  [facility] and the medical providers at [facility] strive to bring you the best medical care.  In doing so we not only want to address your current medical conditions and concerns but also to detect new conditions early and prevent illness, disease and health-related problems.    Medicare offers a yearly Wellness Visit which allows our clinical staff to assess your need for preventative services including immunizations, lifestyle education, counseling to decrease risk of preventable diseases and screening for fall risk and other medical concerns.    This visit is provided free of charge (no copay) for all Medicare recipients. The clinical pharmacists at [facility] have begun to conduct these Wellness Visits which will also include a thorough review of all your medications.    As you primary medical provider recommend that you make an appointment for your Annual Wellness Visit if you have not done so already this year.  You may set up this appointment before you leave today or you may call back ([phone]) and schedule an appointment.  Please make sure when you call that you mention that you are scheduling your Annual Wellness Visit with the clinical pharmacist so that the appointment may be made for the proper length of time.      Continue current medications. Continue good therapeutic lifestyle changes which include good diet and exercise. Fall precautions discussed with patient. If an FOBT was given today- please return it to our front desk. If you are over 55 years old - you may need Prevnar 78 or the adult Pneumonia vaccine.  **Flu shots are available--- please call and schedule a FLU-CLINIC appointment**  After your visit with [place] today you will receive a survey in the mail or online from [employer] regarding your care with [place]. Please take a moment to fill this out. Your feedback is very  important to [place] as you can help [place] better understand your patient needs as well as improve your experience and satisfaction. WE CARE ABOUT YOU!!!   The patient will continue to take extra strength Tylenol for her back pain as needed. She should make all efforts at keeping herself from falling She should avoid climbing and wear safe shoes without heels. If the patient and her caregiver decide to go see the orthopedic surgeon, they will call us back and we will make arrangements for them to see Dr. Nelva Bush with [facility] orthopedics They should take a copy of the plain films report that was done back in January of this year with him to that visit. The patient should take only extra strength Tylenol if needed for pain. We will call with the results of the lab work as soon as these results become available

## 2017-04-13 NOTE — Progress Notes (Signed)
Subjective:    Patient ID: Traci Wheeler, female    DOB: 16-Jan-1928, 83 y.o.   MRN: [id number]  HPI Pt here for follow up and management of chronic medical problems which includes hyperlipidemia and hypertension. She is taking medication regularly.The patient comes today with her caregiver. She continues to complain of ongoing back pain. She will go ahead and get her flu shot today. A mammogram has been scheduled. She will get her DEXA scan today is it is due and will be given an FOBT to return. She will also get her lab work today. The patient had lumbar spine films in January of this year which showed no change and the anterolisthesis of L3 on L4 and degenerative disc disease changes at L3 and 4 L4 and 5 and L5 and S1. This patient has a history of hyperlipidemia hypertension and depression and hiatal hernia. She also has her ongoing issues with dementia an elevated platelet count. The patient's caregiver comes with her to the visit today. She comes in the home about 5 every day make sure that she has food to eat. The patient denies any chest pain or shortness of breath. She denies any trouble with her stomach including nausea vomiting diarrhea blood in the stool or black tarry bowel movements and is passing her water without problems. Her only problem is her ongoing back pain and the notes from the x-rays done in January this year were reviewed above. The caregiver does not seem to think it is that much of an issue but the patient thinks it is a big issue for her. They will have a further discussion about this and with the patient's nephew and if they make a decision, we would be glad to do a referral to the orthopedic surgeon in [facility] for further evaluation. They will discuss this and get back in touch with [place].   Patient Active Problem List   Diagnosis Date Noted  . Thrombocytosis ([facility]) 12/10/2016  . Malaise and fatigue 10/01/2013  . Hypertension 12/14/2012  . Hyperlipemia 12/14/2012  .  Dementia 12/11/2012  . Depression 10/04/2012  . ALLERGIC RHINITIS 07/19/2007  . ASTHMA 07/19/2007  . ESOPHAGEAL REFLUX 07/19/2007   Outpatient Encounter Prescriptions as of 04/13/2017  Medication Sig  . albuterol (PROVENTIL HFA;VENTOLIN HFA) 108 (90 BASE) MCG/ACT inhaler Inhale 2 puffs into the lungs every 6 (six) hours as needed for wheezing.  Marland Kitchen amLODipine (NORVASC) 10 MG tablet TAKE (1) TABLET DAILY IN THE MORNING.  Marland Kitchen buPROPion (WELLBUTRIN XL) 300 MG 24 hr tablet TAKE 1 TABLET DAILY  . busPIRone (BUSPAR) 15 MG tablet TAKE 1 TABLET EVERY MORNING  . donepezil (ARICEPT) 10 MG tablet TAKE ONE TABLET AT BEDTIME  . DULoxetine (CYMBALTA) 60 MG capsule TAKE (1) CAPSULE DAILY  . lamoTRIgine (LAMICTAL) 25 MG tablet TAKE  (1)  TABLET TWICE A DAY.  Marland Kitchen levothyroxine (SYNTHROID, LEVOTHROID) 50 MCG tablet TAKE 1 TABLET DAILY  . LORazepam (ATIVAN) 0.5 MG tablet TAKE ONE TABLET AT BEDTIME  . memantine (NAMENDA) 10 MG tablet TAKE (1) TABLET TWICE A DAY.  . mirtazapine (REMERON) 30 MG tablet TAKE ONE TABLET AT BEDTIME  . pantoprazole (PROTONIX) 40 MG tablet TAKE 1 TABLET ONCE A DAY  . polyethylene glycol powder (GLYCOLAX/MIRALAX) powder Take 17 g by mouth daily.  Marland Kitchen SALINE NASAL MIST NA Place 1 spray into the nose daily as needed (for congestion).  . simvastatin (ZOCOR) 20 MG tablet TAKE ONE TABLET AT BEDTIME   No facility-administered encounter medications on  file as of 04/13/2017.      Review of Systems  Constitutional: Negative.   HENT: Negative.   Eyes: Negative.   Respiratory: Negative.   Cardiovascular: Negative.   Gastrointestinal: Negative.   Endocrine: Negative.   Genitourinary: Negative.   Musculoskeletal: Positive for back pain (on-going back pain).  Skin: Negative.   Allergic/Immunologic: Negative.   Neurological: Negative.   Hematological: Negative.   Psychiatric/Behavioral: Negative.        Objective:   Physical Exam  Constitutional: She is oriented to person, place, and time.  She appears well-developed and well-nourished. No distress.  Patient is pleasant and appears to be happy and smiling.  HENT:  Head: Normocephalic and atraumatic.  Right Ear: External ear normal.  Left Ear: External ear normal.  Nose: Nose normal.  Mouth/Throat: Oropharynx is clear and moist. No oropharyngeal exudate.  Ear canals are clear of wax.  Eyes: Pupils are equal, round, and reactive to light. Conjunctivae and EOM are normal. Right eye exhibits no discharge. Left eye exhibits no discharge. No scleral icterus.  Neck: Normal range of motion. Neck supple. No thyromegaly present.  No bruits or thyromegaly  Cardiovascular: Normal rate, regular rhythm, normal heart sounds and intact distal pulses.   No murmur heard. The heart is regular at 72/m  Pulmonary/Chest: Effort normal and breath sounds normal. No respiratory distress. She has no wheezes. She has no rales. She exhibits no tenderness.  Clear anteriorly and posteriorly and no wheezing  Abdominal: Soft. Bowel sounds are normal. She exhibits no mass. There is no tenderness. There is no rebound and no guarding.  The patient has her long-standing umbilical hernia. There is no liver or spleen enlargement no masses and no bruits noted.  Genitourinary:  Genitourinary Comments: Both breasts were checked and no masses were palpable and no axillary adenopathy was palpable.  Musculoskeletal: She exhibits no edema.  The patient has trouble laying down and sitting up because of her arthritis in her back and her anterolisthesis of L3 on L4.  Lymphadenopathy:    She has no cervical adenopathy.  Neurological: She is oriented to person, place, and time. She has normal reflexes. No cranial nerve deficit.  The patient's memory is poor but stable compared to recent visits.  Skin: Skin is warm and dry. No rash noted.  Psychiatric: She has a normal mood and affect. Her behavior is normal. Thought content normal.  The patient's memory is not good and she  does relate her complaints fairly well.  Nursing note and vitals reviewed.  BP (!) 159/84   Pulse 71   Temp (!) 97.4 F (36.3 C) (Oral)   Ht 5' 6"  (1.676 m)   Wt 181 lb (82.1 kg)   BMI 29.21 kg/m         Assessment & Plan:  1. Essential hypertension -The blood pressure is slightly elevated on the systolic side today but there will be no change in treatment based on her age and senility and dementia. - BMP8+EGFR - CBC with Differential/Platelet - Hepatic function panel  2. Vitamin D deficiency -Continue current treatment pending results of lab work - CBC with Differential/Platelet - VITAMIN D 25 Hydroxy (Vit-D Deficiency, Fractures)  3. Pure hypercholesterolemia -Continue current treatment pending results of lab work - CBC with Differential/Platelet - Lipid panel  4. Hypothyroidism, unspecified type -Continue current treatment - CBC with Differential/Platelet  5. Vascular dementia without behavioral disturbance -The patient appears to be stable regarding her dementia - CBC with Differential/Platelet  6. Osteoarthritis  of lumbar spine with myelopathy -She does complain with back pain. Hassan Rowan, the caregiver that comes with her today we'll discuss this with the patient's nephew about any further workup for this problem. - CBC with Differential/Platelet  7. Anterolisthesis -She has anterolisthesis of L3 on L4. She also has a lot of arthritic changes in her low back based on the x-rays from early this year. It is certainly possible that an injection could help release some of her pain. If the family decides that they would like for [place] to make a referral there will call back and speak to my nurse and we will make this referral to [facility] sports medicine and orthopedics. - DG [facility] DEXA; Future - CBC with Differential/Platelet  8. Screening for osteoporosis - DG [facility] DEXA; Future - CBC with Differential/Platelet  9. Postmenopausal - DG [facility] DEXA; Future - CBC with  Differential/Platelet  10. Encounter for immunization - Flu vaccine HIGH DOSE PF  Patient Instructions                       Medicare Annual Wellness Visit  [facility] and the medical providers at [facility] strive to bring you the best medical care.  In doing so we not only want to address your current medical conditions and concerns but also to detect new conditions early and prevent illness, disease and health-related problems.    Medicare offers a yearly Wellness Visit which allows our clinical staff to assess your need for preventative services including immunizations, lifestyle education, counseling to decrease risk of preventable diseases and screening for fall risk and other medical concerns.    This visit is provided free of charge (no copay) for all Medicare recipients. The clinical pharmacists at [facility] have begun to conduct these Wellness Visits which will also include a thorough review of all your medications.    As you primary medical provider recommend that you make an appointment for your Annual Wellness Visit if you have not done so already this year.  You may set up this appointment before you leave today or you may call back ([phone]) and schedule an appointment.  Please make sure when you call that you mention that you are scheduling your Annual Wellness Visit with the clinical pharmacist so that the appointment may be made for the proper length of time.      Continue current medications. Continue good therapeutic lifestyle changes which include good diet and exercise. Fall precautions discussed with patient. If an FOBT was given today- please return it to our front desk. If you are over 49 years old - you may need Prevnar 34 or the adult Pneumonia vaccine.  **Flu shots are available--- please call and schedule a FLU-CLINIC appointment**  After your visit with [place] today you will receive a survey in the mail or online  from [employer] regarding your care with [place]. Please take a moment to fill this out. Your feedback is very important to [place] as you can help [place] better understand your patient needs as well as improve your experience and satisfaction. WE CARE ABOUT YOU!!!   The patient will continue to take extra strength Tylenol for her back pain as needed. She should make all efforts at keeping herself from falling She should avoid climbing and wear safe shoes without heels. If the patient and her caregiver decide to go see the orthopedic surgeon, they will call us back and we will make arrangements for  them to see Dr. Nelva Bush with [facility] orthopedics They should take a copy of the plain films report that was done back in January of this year with him to that visit. The patient should take only extra strength Tylenol if needed for pain. We will call with the results of the lab work as soon as these results become available    Arrie Senate MD

## 2017-04-14 LAB — CBC WITH DIFFERENTIAL/PLATELET
Basophils Absolute: 0.1 10*3/uL (ref 0.0–0.2)
Basos: 1 %
EOS (ABSOLUTE): 0.2 10*3/uL (ref 0.0–0.4)
Eos: 3 %
Hematocrit: 39 % (ref 34.0–46.6)
Hemoglobin: 12.6 g/dL (ref 11.1–15.9)
Immature Grans (Abs): 0 10*3/uL (ref 0.0–0.1)
Immature Granulocytes: 0 %
Lymphocytes Absolute: 2.1 10*3/uL (ref 0.7–3.1)
Lymphs: 33 %
MCH: 29.8 pg (ref 26.6–33.0)
MCHC: 32.3 g/dL (ref 31.5–35.7)
MCV: 92 fL (ref 79–97)
Monocytes Absolute: 0.6 10*3/uL (ref 0.1–0.9)
Monocytes: 10 %
Neutrophils Absolute: 3.5 10*3/uL (ref 1.4–7.0)
Neutrophils: 53 %
Platelets: 411 10*3/uL — ABNORMAL HIGH (ref 150–379)
RBC: 4.23 x10E6/uL (ref 3.77–5.28)
RDW: 14.9 % (ref 12.3–15.4)
WBC: 6.4 10*3/uL (ref 3.4–10.8)

## 2017-04-14 LAB — BMP8+EGFR
BUN/Creatinine Ratio: 10 — ABNORMAL LOW (ref 12–28)
BUN: 15 mg/dL (ref 8–27)
CO2: 25 mmol/L (ref 20–29)
Calcium: 10 mg/dL (ref 8.7–10.3)
Chloride: 100 mmol/L (ref 96–106)
Creatinine, Ser: 1.46 mg/dL — ABNORMAL HIGH (ref 0.57–1.00)
GFR calc Af Amer: 37 mL/min/{1.73_m2} — ABNORMAL LOW
GFR calc non Af Amer: 32 mL/min/{1.73_m2} — ABNORMAL LOW
Glucose: 98 mg/dL (ref 65–99)
Potassium: 4.2 mmol/L (ref 3.5–5.2)
Sodium: 143 mmol/L (ref 134–144)

## 2017-04-14 LAB — LIPID PANEL
Chol/HDL Ratio: 3.3 ratio (ref 0.0–4.4)
Cholesterol, Total: 174 mg/dL (ref 100–199)
HDL: 53 mg/dL
LDL Calculated: 79 mg/dL (ref 0–99)
Triglycerides: 209 mg/dL — ABNORMAL HIGH (ref 0–149)
VLDL Cholesterol Cal: 42 mg/dL — ABNORMAL HIGH (ref 5–40)

## 2017-04-14 LAB — HEPATIC FUNCTION PANEL
ALK PHOS: 113 IU/L (ref 39–117)
ALT: 6 IU/L (ref 0–32)
AST: 17 IU/L (ref 0–40)
Albumin: 4.6 g/dL (ref 3.5–4.7)
BILIRUBIN TOTAL: 0.2 mg/dL (ref 0.0–1.2)
BILIRUBIN, DIRECT: 0.09 mg/dL (ref 0.00–0.40)
Total Protein: 7 g/dL (ref 6.0–8.5)

## 2017-04-14 LAB — VITAMIN D 25 HYDROXY (VIT D DEFICIENCY, FRACTURES): Vit D, 25-Hydroxy: 29.7 ng/mL — ABNORMAL LOW (ref 30.0–100.0)

## 2017-04-15 ENCOUNTER — Other Ambulatory Visit: Payer: Self-pay | Admitting: Family Medicine

## 2017-04-23 ENCOUNTER — Other Ambulatory Visit: Payer: Self-pay | Admitting: Family Medicine

## 2017-05-13 ENCOUNTER — Other Ambulatory Visit: Payer: Self-pay | Admitting: Family Medicine

## 2017-05-20 ENCOUNTER — Other Ambulatory Visit: Payer: Self-pay | Admitting: Family Medicine

## 2017-05-24 NOTE — Progress Notes (Deleted)
Subjective: CC: feeling sick PCP: Chipper Herb, MD Traci Wheeler is a 83 y.o. female presenting to clinic today for:  1. ***  Allergies  Allergen Reactions  . Aspirin Nausea And Vomiting  . Codeine Phosphate     REACTION: unspecified  . Darvocet [Propoxyphene N-Acetaminophen]   . Glucosamine Forte [Nutritional Supplements]   . Naproxen     REACTION: unspecified  . Nsaids   . Sulfamethoxazole     REACTION: unspecified  . Sulfur Dioxide Itching   Past Medical History:  Diagnosis Date  . Asthma   . Colon polyps   . Dementia   . Depression   . Fibromyalgia   . Gastric polyps   . GERD (gastroesophageal reflux disease)   . Hiatal hernia   . Hyperlipidemia   . Hypertension   . Postmenopausal HRT (hormone replacement therapy)   . RBBB (right bundle branch block)    Family History  Problem Relation Age of Onset  . Stroke Sister        2 minor strokes  . Kidney disease Brother     Current Outpatient Medications:  .  albuterol (PROVENTIL HFA;VENTOLIN HFA) 108 (90 BASE) MCG/ACT inhaler, Inhale 2 puffs into the lungs every 6 (six) hours as needed for wheezing., Disp: 1 Inhaler, Rfl: 2 .  amLODipine (NORVASC) 10 MG tablet, TAKE (1) TABLET DAILY IN THE MORNING., Disp: 90 tablet, Rfl: 1 .  buPROPion (WELLBUTRIN XL) 300 MG 24 hr tablet, TAKE 1 TABLET DAILY, Disp: 30 tablet, Rfl: 1 .  busPIRone (BUSPAR) 15 MG tablet, TAKE 1 TABLET EVERY MORNING, Disp: 30 tablet, Rfl: 2 .  donepezil (ARICEPT) 10 MG tablet, TAKE ONE TABLET AT BEDTIME, Disp: 30 tablet, Rfl: 5 .  DULoxetine (CYMBALTA) 60 MG capsule, TAKE (1) CAPSULE DAILY, Disp: 30 capsule, Rfl: 2 .  lamoTRIgine (LAMICTAL) 25 MG tablet, TAKE  (1)  TABLET TWICE A DAY., Disp: 60 tablet, Rfl: 0 .  levothyroxine (SYNTHROID, LEVOTHROID) 50 MCG tablet, TAKE 1 TABLET DAILY, Disp: 90 tablet, Rfl: 2 .  LORazepam (ATIVAN) 0.5 MG tablet, TAKE ONE TABLET AT BEDTIME, Disp: 30 tablet, Rfl: 3 .  memantine (NAMENDA) 10 MG tablet, TAKE  (1) TABLET TWICE A DAY., Disp: 60 tablet, Rfl: 5 .  mirtazapine (REMERON) 30 MG tablet, TAKE ONE TABLET AT BEDTIME, Disp: 30 tablet, Rfl: 4 .  pantoprazole (PROTONIX) 40 MG tablet, TAKE 1 TABLET ONCE A DAY, Disp: 90 tablet, Rfl: 1 .  polyethylene glycol powder (GLYCOLAX/MIRALAX) powder, Take 17 g by mouth daily., Disp: 3350 g, Rfl: 1 .  SALINE NASAL MIST NA, Place 1 spray into the nose daily as needed (for congestion)., Disp: , Rfl:  .  simvastatin (ZOCOR) 20 MG tablet, TAKE ONE TABLET AT BEDTIME, Disp: 90 tablet, Rfl: 1  Social Hx: *** smoker.  Health Maintenance: *** Flu Vaccine needed: {YES/NO/WILD CARDS:18581}  Tdap Vaccine needed: {YES/NO/WILD CARDS:18581}  - every 34yrs - (<3 lifetime doses or unknown): all wounds -- look up need for Tetanus IG - (>=3 lifetime doses): clean/minor wound if >79yrs from previous; all other wounds if >26yrs from previous Zoster Vaccine needed: {YES/NO/WILD CARDS:18581} (those >50yo, once) Pneumonia Vaccine needed: {YES/NO/WILD CARDS:18581} (those w/ risk factors) - (<53yr) Both: Immunocompromised, cochlear implant, CSF leak, asplenic, sickle cell, Chronic Renal Failure - (<95yr) PPSV-23 only: Heart dz, lung disease, DM, tobacco abuse, alcoholism, cirrhosis/liver disease. - (>57yr): PPSV13 then PPSV23 in 6-12mths;  - (>35yr): repeat PPSV23 once if pt received prior to 81yo and 70yrs have passed  ROS: Per HPI  Objective: Office vital signs reviewed. There were no vitals taken for this visit.  Physical Examination:  General: Awake, alert, *** nourished, No acute distress HEENT: Normal    Neck: No masses palpated. No lymphadenopathy    Ears: Tympanic membranes intact, normal light reflex, no erythema, no bulging    Eyes: PERRLA, extraocular membranes intact, sclera ***    Nose: nasal turbinates moist, *** nasal discharge    Throat: moist mucus membranes, no erythema, *** tonsillar exudate.  Airway is patent Cardio: regular rate and rhythm, S1S2  heard, no murmurs appreciated Pulm: clear to auscultation bilaterally, no wheezes, rhonchi or rales; normal work of breathing on room air GI: soft, non-tender, non-distended, bowel sounds present x4, no hepatomegaly, no splenomegaly, no masses GU: external vaginal tissue ***, cervix ***, *** punctate lesions on cervix appreciated, *** discharge from cervical os, *** bleeding, *** cervical motion tenderness, *** abdominal/ adnexal masses Extremities: warm, well perfused, No edema, cyanosis or clubbing; +*** pulses bilaterally MSK: *** gait and *** station Skin: dry; intact; no rashes or lesions Neuro: *** Strength and light touch sensation grossly intact, *** DTRs ***/4  Assessment/ Plan: 83 y.o. female   ***  No orders of the defined types were placed in this encounter.  No orders of the defined types were placed in this encounter.    Janora Norlander, DO [place] [phone]

## 2017-05-25 ENCOUNTER — Ambulatory Visit: Payer: Medicare Other | Admitting: Family Medicine

## 2017-06-10 ENCOUNTER — Other Ambulatory Visit: Payer: Self-pay | Admitting: Family Medicine

## 2017-06-13 DIAGNOSIS — I6789 Other cerebrovascular disease: Secondary | ICD-10-CM | POA: Diagnosis not present

## 2017-06-14 ENCOUNTER — Telehealth: Payer: Self-pay | Admitting: Family Medicine

## 2017-06-14 NOTE — Telephone Encounter (Signed)
Aware she can do this

## 2017-06-15 ENCOUNTER — Ambulatory Visit: Payer: Medicare Other | Admitting: Family Medicine

## 2017-06-15 ENCOUNTER — Encounter: Payer: Self-pay | Admitting: Family Medicine

## 2017-06-15 VITALS — BP 154/90 | HR 80 | Temp 97.2°F | Ht 66.0 in | Wt 174.0 lb

## 2017-06-15 DIAGNOSIS — F419 Anxiety disorder, unspecified: Secondary | ICD-10-CM

## 2017-06-15 DIAGNOSIS — F0150 Vascular dementia without behavioral disturbance: Secondary | ICD-10-CM | POA: Diagnosis not present

## 2017-06-15 DIAGNOSIS — E559 Vitamin D deficiency, unspecified: Secondary | ICD-10-CM

## 2017-06-15 DIAGNOSIS — F5101 Primary insomnia: Secondary | ICD-10-CM

## 2017-06-15 DIAGNOSIS — D473 Essential (hemorrhagic) thrombocythemia: Secondary | ICD-10-CM

## 2017-06-15 DIAGNOSIS — D75839 Thrombocytosis, unspecified: Secondary | ICD-10-CM

## 2017-06-15 DIAGNOSIS — E039 Hypothyroidism, unspecified: Secondary | ICD-10-CM | POA: Diagnosis not present

## 2017-06-15 DIAGNOSIS — I10 Essential (primary) hypertension: Secondary | ICD-10-CM | POA: Diagnosis not present

## 2017-06-15 DIAGNOSIS — R410 Disorientation, unspecified: Secondary | ICD-10-CM

## 2017-06-15 LAB — URINALYSIS, COMPLETE
BILIRUBIN UA: NEGATIVE
Glucose, UA: NEGATIVE
Nitrite, UA: NEGATIVE
PH UA: 7 (ref 5.0–7.5)
PROTEIN UA: NEGATIVE
RBC, UA: NEGATIVE
SPEC GRAV UA: 1.015 (ref 1.005–1.030)
UUROB: 0.2 mg/dL (ref 0.2–1.0)

## 2017-06-15 LAB — MICROSCOPIC EXAMINATION

## 2017-06-15 NOTE — Patient Instructions (Signed)
Continue to drink plenty of fluids and stay well-hydrated Change tea to decaffeinated and use artificial sweetener like Stevia We will call with lab work results of urinalysis CBC BMP and thyroid as soon as these results become available We will consider at some point in the future if she remains agitated of increasing the BuSpar to twice daily instead of once daily in the morning and continuing with the Lorazepam at nighttime.  For now we will not make any changes in her medicines.

## 2017-06-15 NOTE — Progress Notes (Signed)
Subjective:    Patient ID: Traci Wheeler, female    DOB: Aug 23, 1927, 83 y.o.   MRN: [id number]  HPI  Patient here today for anxiety, more confusion and some chills.  Patient's nephew has sent me a fax letter ahead of time.  Was reviewed and will be scanned into the record.  He did not come with his aunt today because his wife has had a recent knee replacement.  His concern is that his aunt has become more agitated and the caregiver has been diagnosed with cancer.  She has been having more anxiety attacks.  The paramedics have been called to the home and nothing was found to be wrong.  The patient is 83 years old.  The patient's memory is not good.  There may be some phobias about being alone.  The patient comes with her caregiver today.  She is drinking plenty of fluids and is drinking to me that his caffeine loaded as well as this is sweetened tea.  We discussed this during the visit and she will try to get decaffeinated tea and will use Stevia or Splenda for this weakening instead of sugar.  This could be playing a role with her insomnia and not resting as well.  She denies any trouble with chest pain coughing shortness of breath trouble with her bowels moving or trouble with passing her water.  The caregiver is with her often and does not see anything any different than her being a little bit more agitated and not resting as well at night.  Please see note from her nephew [name].     Patient Active Problem List   Diagnosis Date Noted  . Thrombocytosis ([place]) 12/10/2016  . Malaise and fatigue 10/01/2013  . Hypertension 12/14/2012  . Hyperlipemia 12/14/2012  . Dementia 12/11/2012  . Depression 10/04/2012  . ALLERGIC RHINITIS 07/19/2007  . ASTHMA 07/19/2007  . ESOPHAGEAL REFLUX 07/19/2007   Outpatient Encounter Medications as of 06/15/2017  Medication Sig  . albuterol (PROVENTIL HFA;VENTOLIN HFA) 108 (90 BASE) MCG/ACT inhaler Inhale 2 puffs into the lungs every 6 (six) hours as  needed for wheezing.  Marland Kitchen amLODipine (NORVASC) 10 MG tablet TAKE (1) TABLET DAILY IN THE MORNING.  Marland Kitchen buPROPion (WELLBUTRIN XL) 300 MG 24 hr tablet TAKE 1 TABLET DAILY  . busPIRone (BUSPAR) 15 MG tablet TAKE 1 TABLET EVERY MORNING  . donepezil (ARICEPT) 10 MG tablet TAKE ONE TABLET AT BEDTIME  . DULoxetine (CYMBALTA) 60 MG capsule TAKE (1) CAPSULE DAILY  . lamoTRIgine (LAMICTAL) 25 MG tablet TAKE  (1)  TABLET TWICE A DAY.  Marland Kitchen levothyroxine (SYNTHROID, LEVOTHROID) 50 MCG tablet TAKE 1 TABLET DAILY  . LORazepam (ATIVAN) 0.5 MG tablet TAKE ONE TABLET AT BEDTIME  . memantine (NAMENDA) 10 MG tablet TAKE (1) TABLET TWICE A DAY.  . mirtazapine (REMERON) 30 MG tablet TAKE ONE TABLET AT BEDTIME  . pantoprazole (PROTONIX) 40 MG tablet TAKE 1 TABLET ONCE A DAY  . polyethylene glycol powder (GLYCOLAX/MIRALAX) powder Take 17 g by mouth daily.  Marland Kitchen SALINE NASAL MIST NA Place 1 spray into the nose daily as needed (for congestion).  . simvastatin (ZOCOR) 20 MG tablet TAKE ONE TABLET AT BEDTIME   No facility-administered encounter medications on file as of 06/15/2017.      Review of Systems  Constitutional: Positive for chills.  HENT: Negative.   Eyes: Negative.   Respiratory: Negative.   Cardiovascular: Negative.   Gastrointestinal: Negative.   Endocrine: Negative.   Genitourinary: Negative.  Musculoskeletal: Negative.   Skin: Negative.   Allergic/Immunologic: Negative.   Neurological: Negative.   Hematological: Negative.   Psychiatric/Behavioral: Positive for confusion. The patient is nervous/anxious.        Objective:   Physical Exam  Constitutional: She appears well-developed and well-nourished. No distress.  The patient is elderly and pleasant and does not hear well but not agitated.  HENT:  Head: Normocephalic and atraumatic.  Right Ear: External ear normal.  Left Ear: External ear normal.  Nose: Nose normal.  Mouth/Throat: Oropharynx is clear and moist. No oropharyngeal exudate.    Appears well-hydrated  Eyes: Conjunctivae and EOM are normal. Pupils are equal, round, and reactive to light. Right eye exhibits no discharge. Left eye exhibits no discharge. No scleral icterus.  Neck: Normal range of motion. Neck supple. No thyromegaly present.  No bruits thyromegaly or anterior cervical adenopathy  Cardiovascular: Normal rate, regular rhythm and normal heart sounds.  No murmur heard. Heart is regular at 72/min  Pulmonary/Chest: Effort normal and breath sounds normal. No respiratory distress. She has no wheezes. She has no rales.  Clear anteriorly and posteriorly  Abdominal: Soft. Bowel sounds are normal. She exhibits no mass. There is tenderness. There is no rebound and no guarding.  Large umbilical hernia and slight generalized abdominal tenderness without organ enlargement masses or bruits  Musculoskeletal: Normal range of motion. She exhibits no edema.  Lymphadenopathy:    She has no cervical adenopathy.  Neurological: She is alert.  Patient is obviously forgetful but seems to comprehend conversation in a short-term manner with appropriate responses to questions asked of her.  Skin: Skin is warm and dry. No rash noted.  Psychiatric: She has a normal mood and affect. Her behavior is normal. Judgment and thought content normal.  Nursing note and vitals reviewed.  BP (!) 154/90 (BP Location: Right Arm)   Pulse 80   Temp (!) 97.2 F (36.2 C) (Oral)   Ht _0  (1.676 m)   Wt 174 lb (78.9 kg)   BMI 28.08 kg/m         Assessment & Plan:  1. Confusion -Check basic lab work and especially urinalysis -Eliminate caffeine in the diet especially after lunch daily and make sure that her fluid intake is only decaffeinated. -This could be playing a role with her insomnia and her increased confusion and agitation because she is not resting as well at nighttime - BMP8+EGFR - CBC with Differential/Platelet - Urinalysis, Complete - Urine Culture - Thyroid Panel With  TSH  2. Anxiety -Reduce caffeine -May consider increasing BuSpar to twice daily if agitation and irritability continue but for now we will get basic lab work and urinalysis. - BMP8+EGFR - CBC with Differential/Platelet - Thyroid Panel With TSH  3. Primary insomnia -Reduce caffeine  4. Vitamin D deficiency -Continue with vitamin D replacement  5. Hypothyroidism, unspecified type -Thyroid profile  6. Thrombocytosis ([facility]) -CBC  7. Vascular dementia without behavioral disturbance -Continue with Namenda and Aricept  8. Essential hypertension -Blood pressure elevated today but no change in treatment  Patient Instructions  Continue to drink plenty of fluids and stay well-hydrated Change tea to decaffeinated and use artificial sweetener like Stevia We will call with lab work results of urinalysis CBC BMP and thyroid as soon as these results become available We will consider at some point in the future if she remains agitated of increasing the BuSpar to twice daily instead of once daily in the morning and continuing with the Lorazepam at nighttime.  For now we will not make any changes in her medicines.  Arrie Senate MD

## 2017-06-16 ENCOUNTER — Telehealth: Payer: Self-pay | Admitting: Family Medicine

## 2017-06-16 DIAGNOSIS — R69 Illness, unspecified: Secondary | ICD-10-CM | POA: Diagnosis not present

## 2017-06-16 LAB — BMP8+EGFR
BUN / CREAT RATIO: 11 — AB (ref 12–28)
BUN: 18 mg/dL (ref 8–27)
CHLORIDE: 103 mmol/L (ref 96–106)
CO2: 27 mmol/L (ref 20–29)
CREATININE: 1.63 mg/dL — AB (ref 0.57–1.00)
Calcium: 9.8 mg/dL (ref 8.7–10.3)
GFR calc Af Amer: 32 mL/min/{1.73_m2} — ABNORMAL LOW (ref >59)
GFR calc non Af Amer: 28 mL/min/{1.73_m2} — ABNORMAL LOW (ref >59)
GLUCOSE: 96 mg/dL (ref 65–99)
POTASSIUM: 4.3 mmol/L (ref 3.5–5.2)
SODIUM: 143 mmol/L (ref 134–144)

## 2017-06-16 LAB — CBC WITH DIFFERENTIAL/PLATELET
Basophils Absolute: 0.1 10*3/uL (ref 0.0–0.2)
Basos: 1 %
EOS (ABSOLUTE): 0.2 10*3/uL (ref 0.0–0.4)
EOS: 2 %
HEMATOCRIT: 38.4 % (ref 34.0–46.6)
Hemoglobin: 12.7 g/dL (ref 11.1–15.9)
Immature Grans (Abs): 0 10*3/uL (ref 0.0–0.1)
Immature Granulocytes: 0 %
LYMPHS ABS: 2.1 10*3/uL (ref 0.7–3.1)
Lymphs: 31 %
MCH: 31 pg (ref 26.6–33.0)
MCHC: 33.1 g/dL (ref 31.5–35.7)
MCV: 94 fL (ref 79–97)
MONOS ABS: 0.7 10*3/uL (ref 0.1–0.9)
Monocytes: 10 %
NEUTROS PCT: 56 %
Neutrophils Absolute: 3.7 10*3/uL (ref 1.4–7.0)
PLATELETS: 409 10*3/uL — AB (ref 150–379)
RBC: 4.1 x10E6/uL (ref 3.77–5.28)
RDW: 14.3 % (ref 12.3–15.4)
WBC: 6.7 10*3/uL (ref 3.4–10.8)

## 2017-06-16 LAB — THYROID PANEL WITH TSH
FREE THYROXINE INDEX: 1.9 (ref 1.2–4.9)
T3 UPTAKE RATIO: 24 % (ref 24–39)
T4, Total: 8.1 ug/dL (ref 4.5–12.0)
TSH: 1.5 u[IU]/mL (ref 0.450–4.500)

## 2017-06-17 LAB — URINE CULTURE

## 2017-06-17 NOTE — Telephone Encounter (Signed)
Traci Wheeler gave [place] results

## 2017-06-22 ENCOUNTER — Other Ambulatory Visit: Payer: Self-pay | Admitting: Family Medicine

## 2017-06-27 ENCOUNTER — Other Ambulatory Visit: Payer: Self-pay | Admitting: Family Medicine

## 2017-06-27 ENCOUNTER — Other Ambulatory Visit: Payer: Self-pay | Admitting: Physician Assistant

## 2017-07-11 DIAGNOSIS — H43813 Vitreous degeneration, bilateral: Secondary | ICD-10-CM | POA: Diagnosis not present

## 2017-07-11 DIAGNOSIS — H52223 Regular astigmatism, bilateral: Secondary | ICD-10-CM | POA: Diagnosis not present

## 2017-07-11 DIAGNOSIS — H26493 Other secondary cataract, bilateral: Secondary | ICD-10-CM | POA: Diagnosis not present

## 2017-07-11 DIAGNOSIS — H5203 Hypermetropia, bilateral: Secondary | ICD-10-CM | POA: Diagnosis not present

## 2017-07-18 ENCOUNTER — Other Ambulatory Visit: Payer: Self-pay | Admitting: Nurse Practitioner

## 2017-07-18 ENCOUNTER — Other Ambulatory Visit: Payer: Self-pay | Admitting: Family Medicine

## 2017-07-19 NOTE — Telephone Encounter (Signed)
Last seen 06/15/17  DWM

## 2017-07-22 ENCOUNTER — Other Ambulatory Visit: Payer: Self-pay | Admitting: Family Medicine

## 2017-07-23 ENCOUNTER — Ambulatory Visit: Payer: Medicare Other | Admitting: Nurse Practitioner

## 2017-07-23 ENCOUNTER — Encounter: Payer: Self-pay | Admitting: Nurse Practitioner

## 2017-07-23 VITALS — BP 155/84 | HR 66 | Temp 96.9°F | Ht 66.0 in | Wt 177.0 lb

## 2017-07-23 DIAGNOSIS — R6883 Chills (without fever): Secondary | ICD-10-CM

## 2017-07-23 NOTE — Progress Notes (Signed)
   Subjective:    Patient ID: STERLING UCCI, female    DOB: 12/03/1927, 84 y.o.   MRN: [id number]  HPI Patient brought in by friend. Patient is c/o chills and then hot. She has been doing this off and on for over a year. They have talked with Dr.Moore abiout this in the past and he has n ot been able  To determine anything that could be causing it.     Review of Systems  Constitutional: Positive for chills. Negative for appetite change.  HENT: Negative for congestion, ear discharge, sinus pain, sore throat and trouble swallowing.   Respiratory: Negative for cough.   Cardiovascular: Negative.   Gastrointestinal: Negative.   Neurological: Negative.   Psychiatric/Behavioral: Negative.        Objective:   Physical Exam  Constitutional: She appears well-developed and well-nourished. No distress.  HENT:  Right Ear: Hearing, tympanic membrane, external ear and ear canal normal.  Left Ear: Hearing, tympanic membrane, external ear and ear canal normal.  Nose: Nose normal.  Mouth/Throat: Uvula is midline, oropharynx is clear and moist and mucous membranes are normal.  Neck: Normal range of motion. Neck supple.  Cardiovascular: Normal rate and regular rhythm.  Pulmonary/Chest: Effort normal and breath sounds normal.  Neurological: She is alert.  Skin: Skin is warm.  Psychiatric: She has a normal mood and affect. Her behavior is normal. Judgment and thought content normal.   BP (!) 155/84 (BP Location: Right Arm, Cuff Size: Large)   Pulse 66   Temp (!) 96.9 F (36.1 C) (Oral)   Ht _0  (1.676 m)   Wt 177 lb (80.3 kg)   BMI 28.57 kg/m        Assessment & Plan:   1. Chills    Orders Placed This Encounter  Procedures  . Thyroid Panel With TSH  . CBC with Differential/Platelet  . CMP14+EGFR   Can find no physical reason for chills  Force fluids Follow up with dr. Mayra Neer, FNP

## 2017-07-24 LAB — CBC WITH DIFFERENTIAL/PLATELET
BASOS ABS: 0 10*3/uL (ref 0.0–0.2)
Basos: 0 %
EOS (ABSOLUTE): 0.2 10*3/uL (ref 0.0–0.4)
EOS: 3 %
HEMATOCRIT: 39.8 % (ref 34.0–46.6)
HEMOGLOBIN: 13.5 g/dL (ref 11.1–15.9)
IMMATURE GRANULOCYTES: 0 %
Immature Grans (Abs): 0 10*3/uL (ref 0.0–0.1)
LYMPHS ABS: 1.9 10*3/uL (ref 0.7–3.1)
Lymphs: 27 %
MCH: 30.5 pg (ref 26.6–33.0)
MCHC: 33.9 g/dL (ref 31.5–35.7)
MCV: 90 fL (ref 79–97)
MONOCYTES: 7 %
MONOS ABS: 0.5 10*3/uL (ref 0.1–0.9)
NEUTROS PCT: 63 %
Neutrophils Absolute: 4.3 10*3/uL (ref 1.4–7.0)
Platelets: 484 10*3/uL — ABNORMAL HIGH (ref 150–379)
RBC: 4.43 x10E6/uL (ref 3.77–5.28)
RDW: 14.1 % (ref 12.3–15.4)
WBC: 6.9 10*3/uL (ref 3.4–10.8)

## 2017-07-24 LAB — CMP14+EGFR
ALK PHOS: 111 IU/L (ref 39–117)
ALT: 9 IU/L (ref 0–32)
AST: 16 IU/L (ref 0–40)
Albumin/Globulin Ratio: 1.8 (ref 1.2–2.2)
Albumin: 4.6 g/dL (ref 3.5–4.7)
BUN/Creatinine Ratio: 11 — ABNORMAL LOW (ref 12–28)
BUN: 16 mg/dL (ref 8–27)
Bilirubin Total: 0.3 mg/dL (ref 0.0–1.2)
CALCIUM: 10.4 mg/dL — AB (ref 8.7–10.3)
CO2: 24 mmol/L (ref 20–29)
CREATININE: 1.41 mg/dL — AB (ref 0.57–1.00)
Chloride: 100 mmol/L (ref 96–106)
GFR calc Af Amer: 39 mL/min/{1.73_m2} — ABNORMAL LOW (ref >59)
GFR, EST NON AFRICAN AMERICAN: 34 mL/min/{1.73_m2} — AB (ref >59)
GLOBULIN, TOTAL: 2.6 g/dL (ref 1.5–4.5)
GLUCOSE: 102 mg/dL — AB (ref 65–99)
Potassium: 4.4 mmol/L (ref 3.5–5.2)
SODIUM: 142 mmol/L (ref 134–144)
Total Protein: 7.2 g/dL (ref 6.0–8.5)

## 2017-07-24 LAB — THYROID PANEL WITH TSH
FREE THYROXINE INDEX: 2 (ref 1.2–4.9)
T3 Uptake Ratio: 23 % — ABNORMAL LOW (ref 24–39)
T4, Total: 8.9 ug/dL (ref 4.5–12.0)
TSH: 3.69 u[IU]/mL (ref 0.450–4.500)

## 2017-07-26 ENCOUNTER — Telehealth: Payer: Self-pay | Admitting: Family Medicine

## 2017-08-11 DIAGNOSIS — H43813 Vitreous degeneration, bilateral: Secondary | ICD-10-CM | POA: Diagnosis not present

## 2017-08-11 DIAGNOSIS — H4720 Unspecified optic atrophy: Secondary | ICD-10-CM | POA: Diagnosis not present

## 2017-08-11 DIAGNOSIS — H26493 Other secondary cataract, bilateral: Secondary | ICD-10-CM | POA: Diagnosis not present

## 2017-08-11 DIAGNOSIS — H43393 Other vitreous opacities, bilateral: Secondary | ICD-10-CM | POA: Diagnosis not present

## 2017-08-24 ENCOUNTER — Ambulatory Visit: Payer: Medicare Other | Admitting: Family Medicine

## 2017-08-24 ENCOUNTER — Encounter: Payer: Self-pay | Admitting: Family Medicine

## 2017-08-24 VITALS — BP 161/82 | HR 69 | Temp 97.5°F | Ht 66.0 in | Wt 177.0 lb

## 2017-08-24 DIAGNOSIS — D75839 Thrombocytosis, unspecified: Secondary | ICD-10-CM

## 2017-08-24 DIAGNOSIS — E559 Vitamin D deficiency, unspecified: Secondary | ICD-10-CM

## 2017-08-24 DIAGNOSIS — I10 Essential (primary) hypertension: Secondary | ICD-10-CM

## 2017-08-24 DIAGNOSIS — E7800 Pure hypercholesterolemia, unspecified: Secondary | ICD-10-CM | POA: Diagnosis not present

## 2017-08-24 DIAGNOSIS — E039 Hypothyroidism, unspecified: Secondary | ICD-10-CM | POA: Diagnosis not present

## 2017-08-24 DIAGNOSIS — D473 Essential (hemorrhagic) thrombocythemia: Secondary | ICD-10-CM

## 2017-08-24 DIAGNOSIS — F0150 Vascular dementia without behavioral disturbance: Secondary | ICD-10-CM | POA: Diagnosis not present

## 2017-08-24 NOTE — Progress Notes (Signed)
Subjective:    Patient ID: Traci Wheeler, female    DOB: [birth year], 84 y.o.   MRN: [id number]  HPI Pt here for follow up and management of chronic medical problems which includes hypothyroid, hypertension and hyperlipidemia. She Is taking medication regularly.  The patient comes to the visit today with her caregiver.  She complains of nightmares and has chills and hot spells.  She will be given an FOBT to return.  Her initial vital signs have an elevated blood pressure 161/82.  Patient denies any chest pain or shortness of breath.  She denies any trouble with swallowing heartburn indigestion nausea vomiting diarrhea or blood in the stool.  She does continue to complain of arthritic pain in her back.  The caregiver says that she is taking her medicines correctly and seems to be eating healthily and that she comes by on a daily basis to check on her.    Patient Active Problem List   Diagnosis Date Noted  . Hypothyroidism 06/15/2017  . Anxiety 06/15/2017  . Confusion 06/15/2017  . Thrombocytosis ([place]) 12/10/2016  . Malaise and fatigue 10/01/2013  . Essential hypertension 12/14/2012  . Hyperlipemia 12/14/2012  . Dementia 12/11/2012  . Depression 10/04/2012  . ALLERGIC RHINITIS 07/19/2007  . ASTHMA 07/19/2007  . ESOPHAGEAL REFLUX 07/19/2007   Outpatient Encounter Medications as of 08/24/2017  Medication Sig  . albuterol (PROVENTIL HFA;VENTOLIN HFA) 108 (90 BASE) MCG/ACT inhaler Inhale 2 puffs into the lungs every 6 (six) hours as needed for wheezing.  Marland Kitchen amLODipine (NORVASC) 10 MG tablet TAKE (1) TABLET DAILY IN THE MORNING.  Marland Kitchen buPROPion (WELLBUTRIN XL) 300 MG 24 hr tablet TAKE 1 TABLET DAILY  . busPIRone (BUSPAR) 15 MG tablet TAKE 1 TABLET EVERY MORNING  . donepezil (ARICEPT) 10 MG tablet TAKE ONE TABLET AT BEDTIME  . DULoxetine (CYMBALTA) 60 MG capsule TAKE (1) CAPSULE DAILY  . lamoTRIgine (LAMICTAL) 25 MG tablet TAKE (1) TABLET TWICE A DAY.  Marland Kitchen levothyroxine (SYNTHROID, LEVOTHROID)  50 MCG tablet TAKE 1 TABLET DAILY  . LORazepam (ATIVAN) 0.5 MG tablet TAKE ONE TABLET AT BEDTIME  . memantine (NAMENDA) 10 MG tablet TAKE (1) TABLET TWICE A DAY.  . mirtazapine (REMERON) 30 MG tablet TAKE ONE TABLET AT BEDTIME  . pantoprazole (PROTONIX) 40 MG tablet TAKE 1 TABLET ONCE A DAY  . polyethylene glycol powder (GLYCOLAX/MIRALAX) powder Take 17 g by mouth daily.  Marland Kitchen SALINE NASAL MIST NA Place 1 spray into the nose daily as needed (for congestion).  . simvastatin (ZOCOR) 20 MG tablet TAKE ONE TABLET AT BEDTIME  . [DISCONTINUED] buPROPion (WELLBUTRIN XL) 300 MG 24 hr tablet TAKE 1 TABLET DAILY  . [DISCONTINUED] busPIRone (BUSPAR) 15 MG tablet TAKE 1 TABLET EVERY MORNING  . [DISCONTINUED] donepezil (ARICEPT) 10 MG tablet TAKE ONE TABLET AT BEDTIME  . [DISCONTINUED] LORazepam (ATIVAN) 0.5 MG tablet TAKE ONE TABLET AT BEDTIME   No facility-administered encounter medications on file as of 08/24/2017.      Review of Systems  Constitutional: Positive for chills.  HENT: Negative.   Eyes: Negative.   Respiratory: Negative.   Cardiovascular: Negative.   Gastrointestinal: Negative.   Endocrine: Positive for heat intolerance.  Genitourinary: Negative.   Musculoskeletal: Negative.   Skin: Negative.   Allergic/Immunologic: Negative.   Neurological: Negative.   Hematological: Negative.   Psychiatric/Behavioral: Negative.        Nightmares        Objective:   Physical Exam  Constitutional: She is oriented to person, place, and  time. She appears well-developed and well-nourished. No distress.  The patient is elderly and pleasant and in good spirits and has diminished memory but this seems to be stable.  HENT:  Head: Normocephalic and atraumatic.  Right Ear: External ear normal.  Left Ear: External ear normal.  Nose: Nose normal.  Mouth/Throat: Oropharynx is clear and moist. No oropharyngeal exudate.  Eyes: Conjunctivae and EOM are normal. Pupils are equal, round, and reactive to  light. Right eye exhibits no discharge. Left eye exhibits no discharge. No scleral icterus.  Neck: Normal range of motion. Neck supple. No thyromegaly present.  Cardiovascular: Normal rate, regular rhythm and normal heart sounds.  No murmur heard. Pulmonary/Chest: Effort normal and breath sounds normal. No respiratory distress. She has no wheezes. She has no rales.  Clear anteriorly and posteriorly without any wheezing  Abdominal: Soft. Bowel sounds are normal. She exhibits no mass. There is no tenderness. There is no rebound and no guarding.  Large umbilical hernia  Musculoskeletal: Normal range of motion. She exhibits no edema.  Some stiffness with arising and with walking but patient does not need assistance.  Lymphadenopathy:    She has no cervical adenopathy.  Neurological: She is alert and oriented to person, place, and time. She has normal reflexes. No cranial nerve deficit.  Skin: Skin is warm and dry. No rash noted.  Psychiatric: She has a normal mood and affect. Her behavior is normal. Judgment and thought content normal.  Nursing note and vitals reviewed.    BP (!) 161/82 (BP Location: Left Arm)   Pulse 69   Temp (!) 97.5 F (36.4 C) (Oral)   Ht 5\' 6"  (1.676 m)   Wt 177 lb (80.3 kg)   BMI 28.57 kg/m       Assessment & Plan:  1. Vitamin D deficiency -Continue current treatment  2. Thrombocytosis ([place]) -We will continue to monitor this with CBCs  3. Hypothyroidism, unspecified type -Continue current treatment pending results of future lab work  4. Essential hypertension -Repeat blood pressure was lower than initially at 142/76  5. Pure hypercholesterolemia -Continue with current treatment  6.  Memory impairment -Patient will continue with her simvastatin and as aggressive therapeutic lifestyle changes as possible.  Patient Instructions                       Medicare Annual Wellness Visit  [facility] and the medical providers at [facility] strive to bring you the best medical care.  In doing so we not only want to address your current medical conditions and concerns but also to detect new conditions early and prevent illness, disease and health-related problems.    Medicare offers a yearly Wellness Visit which allows our clinical staff to assess your need for preventative services including immunizations, lifestyle education, counseling to decrease risk of preventable diseases and screening for fall risk and other medical concerns.    This visit is provided free of charge (no copay) for all Medicare recipients. The clinical pharmacists at [facility] have begun to conduct these Wellness Visits which will also include a thorough review of all your medications.    As you primary medical provider recommend that you make an appointment for your Annual Wellness Visit if you have not done so already this year.  You may set up this appointment before you leave today or you may call back ([phone]) and schedule an appointment.  Please make sure when you call  that you mention that you are scheduling your Annual Wellness Visit with the clinical pharmacist so that the appointment may be made for the proper length of time.     Continue current medications. Continue good therapeutic lifestyle changes which include good diet and exercise. Fall precautions discussed with patient. If an FOBT was given today- please return it to our front desk. If you are over 10 years old - you may need Prevnar 36 or the adult Pneumonia vaccine.  **Flu shots are available--- please call and schedule a FLU-CLINIC appointment**  After your visit with [place] today you will receive a survey in the mail or online from [employer] regarding your care with [place]. Please take a moment to fill this out. Your feedback is very important to [place] as you can help [place] better understand your patient needs as well as improve your experience and satisfaction. WE  CARE ABOUT YOU!!!   If possible, please bring some blood pressure readings by the office for review in 3-4 weeks    Arrie Senate MD

## 2017-08-24 NOTE — Patient Instructions (Addendum)
Medicare Annual Wellness Visit  [facility] and the medical providers at [facility] strive to bring you the best medical care.  In doing so we not only want to address your current medical conditions and concerns but also to detect new conditions early and prevent illness, disease and health-related problems.    Medicare offers a yearly Wellness Visit which allows our clinical staff to assess your need for preventative services including immunizations, lifestyle education, counseling to decrease risk of preventable diseases and screening for fall risk and other medical concerns.    This visit is provided free of charge (no copay) for all Medicare recipients. The clinical pharmacists at [facility] have begun to conduct these Wellness Visits which will also include a thorough review of all your medications.    As you primary medical provider recommend that you make an appointment for your Annual Wellness Visit if you have not done so already this year.  You may set up this appointment before you leave today or you may call back ([phone]) and schedule an appointment.  Please make sure when you call that you mention that you are scheduling your Annual Wellness Visit with the clinical pharmacist so that the appointment may be made for the proper length of time.     Continue current medications. Continue good therapeutic lifestyle changes which include good diet and exercise. Fall precautions discussed with patient. If an FOBT was given today- please return it to our front desk. If you are over 35 years old - you may need Prevnar 17 or the adult Pneumonia vaccine.  **Flu shots are available--- please call and schedule a FLU-CLINIC appointment**  After your visit with [place] today you will receive a survey in the mail or online from [employer] regarding your care with [place]. Please take a moment to fill this out. Your feedback is very  important to [place] as you can help [place] better understand your patient needs as well as improve your experience and satisfaction. WE CARE ABOUT YOU!!!   If possible, please bring some blood pressure readings by the office for review in 3-4 weeks

## 2017-09-05 ENCOUNTER — Other Ambulatory Visit: Payer: Medicare Other

## 2017-09-05 DIAGNOSIS — Z1211 Encounter for screening for malignant neoplasm of colon: Secondary | ICD-10-CM

## 2017-09-06 DIAGNOSIS — H4051X3 Glaucoma secondary to other eye disorders, right eye, severe stage: Secondary | ICD-10-CM | POA: Diagnosis not present

## 2017-09-06 DIAGNOSIS — H34231 Retinal artery branch occlusion, right eye: Secondary | ICD-10-CM | POA: Diagnosis not present

## 2017-09-06 DIAGNOSIS — H3582 Retinal ischemia: Secondary | ICD-10-CM | POA: Diagnosis not present

## 2017-09-06 DIAGNOSIS — H4311 Vitreous hemorrhage, right eye: Secondary | ICD-10-CM | POA: Diagnosis not present

## 2017-09-06 LAB — FECAL OCCULT BLOOD, IMMUNOCHEMICAL: FECAL OCCULT BLD: NEGATIVE

## 2017-09-07 ENCOUNTER — Encounter: Payer: Self-pay | Admitting: Family Medicine

## 2017-09-07 DIAGNOSIS — H5789 Other specified disorders of eye and adnexa: Secondary | ICD-10-CM | POA: Diagnosis not present

## 2017-09-07 DIAGNOSIS — H401113 Primary open-angle glaucoma, right eye, severe stage: Secondary | ICD-10-CM | POA: Diagnosis not present

## 2017-09-07 DIAGNOSIS — H4311 Vitreous hemorrhage, right eye: Secondary | ICD-10-CM | POA: Diagnosis not present

## 2017-09-07 DIAGNOSIS — H4051X3 Glaucoma secondary to other eye disorders, right eye, severe stage: Secondary | ICD-10-CM | POA: Diagnosis not present

## 2017-09-14 ENCOUNTER — Other Ambulatory Visit: Payer: Self-pay | Admitting: Family Medicine

## 2017-09-20 ENCOUNTER — Other Ambulatory Visit: Payer: Self-pay | Admitting: Nurse Practitioner

## 2017-09-24 ENCOUNTER — Emergency Department (HOSPITAL_COMMUNITY)
Admission: EM | Admit: 2017-09-24 | Discharge: 2017-09-24 | Disposition: A | Discharge status: Home / Self Care | Payer: Medicare Other | Attending: Emergency Medicine | Admitting: Emergency Medicine

## 2017-09-24 ENCOUNTER — Emergency Department (HOSPITAL_COMMUNITY): Payer: Medicare Other

## 2017-09-24 ENCOUNTER — Other Ambulatory Visit: Payer: Self-pay

## 2017-09-24 DIAGNOSIS — R410 Disorientation, unspecified: Secondary | ICD-10-CM | POA: Insufficient documentation

## 2017-09-24 DIAGNOSIS — Z79899 Other long term (current) drug therapy: Secondary | ICD-10-CM | POA: Diagnosis not present

## 2017-09-24 DIAGNOSIS — E785 Hyperlipidemia, unspecified: Secondary | ICD-10-CM | POA: Diagnosis not present

## 2017-09-24 DIAGNOSIS — I10 Essential (primary) hypertension: Secondary | ICD-10-CM | POA: Diagnosis not present

## 2017-09-24 DIAGNOSIS — J45909 Unspecified asthma, uncomplicated: Secondary | ICD-10-CM | POA: Insufficient documentation

## 2017-09-24 DIAGNOSIS — F0390 Unspecified dementia without behavioral disturbance: Secondary | ICD-10-CM | POA: Diagnosis not present

## 2017-09-24 DIAGNOSIS — I6789 Other cerebrovascular disease: Secondary | ICD-10-CM | POA: Diagnosis not present

## 2017-09-24 LAB — CBC WITH DIFFERENTIAL/PLATELET
BASOS PCT: 0 %
Basophils Absolute: 0 10*3/uL (ref 0.0–0.1)
EOS ABS: 0.2 10*3/uL (ref 0.0–0.7)
EOS PCT: 3 %
HCT: 40.2 % (ref 36.0–46.0)
HEMOGLOBIN: 12 g/dL (ref 12.0–15.0)
Lymphocytes Relative: 25 %
Lymphs Abs: 1.4 10*3/uL (ref 0.7–4.0)
MCH: 29.3 pg (ref 26.0–34.0)
MCHC: 29.9 g/dL — AB (ref 30.0–36.0)
MCV: 98 fL (ref 78.0–100.0)
Monocytes Absolute: 0.5 10*3/uL (ref 0.1–1.0)
Monocytes Relative: 10 %
NEUTROS PCT: 62 %
Neutro Abs: 3.3 10*3/uL (ref 1.7–7.7)
Platelets: 350 10*3/uL (ref 150–400)
RBC: 4.1 MIL/uL (ref 3.87–5.11)
RDW: 14.4 % (ref 11.5–15.5)
WBC: 5.4 10*3/uL (ref 4.0–10.5)

## 2017-09-24 LAB — COMPREHENSIVE METABOLIC PANEL
ALBUMIN: 4 g/dL (ref 3.5–5.0)
ALK PHOS: 89 U/L (ref 38–126)
ALT: 11 U/L — AB (ref 14–54)
ANION GAP: 12 (ref 5–15)
AST: 18 U/L (ref 15–41)
BUN: 12 mg/dL (ref 6–20)
CALCIUM: 9.5 mg/dL (ref 8.9–10.3)
CHLORIDE: 104 mmol/L (ref 101–111)
CO2: 25 mmol/L (ref 22–32)
Creatinine, Ser: 1.22 mg/dL — ABNORMAL HIGH (ref 0.44–1.00)
GFR calc non Af Amer: 39 mL/min — ABNORMAL LOW (ref >60)
GFR, EST AFRICAN AMERICAN: 45 mL/min — AB (ref >60)
Glucose, Bld: 101 mg/dL — ABNORMAL HIGH (ref 65–99)
Potassium: 3.6 mmol/L (ref 3.5–5.1)
SODIUM: 141 mmol/L (ref 135–145)
Total Bilirubin: 0.7 mg/dL (ref 0.3–1.2)
Total Protein: 6.8 g/dL (ref 6.5–8.1)

## 2017-09-24 LAB — URINALYSIS, ROUTINE W REFLEX MICROSCOPIC
BILIRUBIN URINE: NEGATIVE
GLUCOSE, UA: NEGATIVE mg/dL
Hgb urine dipstick: NEGATIVE
KETONES UR: NEGATIVE mg/dL
LEUKOCYTES UA: NEGATIVE
Nitrite: NEGATIVE
PH: 8 (ref 5.0–8.0)
PROTEIN: NEGATIVE mg/dL
Specific Gravity, Urine: 1.01 (ref 1.005–1.030)

## 2017-09-24 LAB — RAPID URINE DRUG SCREEN, HOSP PERFORMED
AMPHETAMINES: NOT DETECTED
BARBITURATES: NOT DETECTED
Benzodiazepines: NOT DETECTED
COCAINE: NOT DETECTED
OPIATES: NOT DETECTED
TETRAHYDROCANNABINOL: NOT DETECTED

## 2017-09-24 LAB — ETHANOL: Alcohol, Ethyl (B): <10 mg/dL (ref <10)

## 2017-09-24 MED ORDER — SODIUM CHLORIDE 0.9 % IV BOLUS (SEPSIS)
500.0000 mL | Freq: Once | INTRAVENOUS | Status: AC
  Administered 2017-09-24: 500 mL via INTRAVENOUS

## 2017-09-24 NOTE — ED Provider Notes (Signed)
[facility] EMERGENCY DEPARTMENT Provider Note   CSN: [id number] Arrival date & time: 09/24/17  0909     History   Chief Complaint Chief Complaint  Patient presents with  . Dementia    HPI Traci Wheeler is a 84 y.o. female.  HPI Level 5 caveat due to confusion.  Patient has a history of dementia but lives alone.  EMS was called to her residence this morning.  When they arrived, patient states she was intending to call her family.  EMS contacted family who stated the patient has dementia.  Were asked to come to her residence due to her confusion.  Family refused.  EMS brought the patient into the emergency department. Past Medical History:  Diagnosis Date  . Asthma   . Colon polyps   . Dementia   . Depression   . Fibromyalgia   . Gastric polyps   . GERD (gastroesophageal reflux disease)   . Hiatal hernia   . Hyperlipidemia   . Hypertension   . Postmenopausal HRT (hormone replacement therapy)   . RBBB (right bundle branch block)     Patient Active Problem List   Diagnosis Date Noted  . Hypothyroidism 06/15/2017  . Anxiety 06/15/2017  . Confusion 06/15/2017  . Thrombocytosis ([place]) 12/10/2016  . Malaise and fatigue 10/01/2013  . Essential hypertension 12/14/2012  . Hyperlipemia 12/14/2012  . Dementia 12/11/2012  . Depression 10/04/2012  . ALLERGIC RHINITIS 07/19/2007  . ASTHMA 07/19/2007  . ESOPHAGEAL REFLUX 07/19/2007    Past Surgical History:  Procedure Laterality Date  . LUMBAR SPINE SURGERY     Disc    OB History    No data available       Home Medications    Prior to Admission medications   Medication Sig Start Date End Date Taking? Authorizing Provider  amLODipine (NORVASC) 10 MG tablet TAKE (1) TABLET DAILY IN THE MORNING. 04/25/17  Yes Chipper Herb, MD  buPROPion (WELLBUTRIN XL) 300 MG 24 hr tablet TAKE 1 TABLET DAILY 05/13/17  Yes Chipper Herb, MD  busPIRone (BUSPAR) 15 MG tablet TAKE 1 TABLET EVERY MORNING 09/14/17  Yes Chipper Herb, MD  donepezil (ARICEPT) 10 MG tablet TAKE ONE TABLET AT BEDTIME 12/28/16  Yes Chipper Herb, MD  DULoxetine (CYMBALTA) 60 MG capsule TAKE (1) CAPSULE DAILY 09/14/17  Yes Chipper Herb, MD  lamoTRIgine (LAMICTAL) 25 MG tablet TAKE (1) TABLET TWICE A DAY. 06/28/17  Yes Chipper Herb, MD  levothyroxine (SYNTHROID, LEVOTHROID) 50 MCG tablet TAKE 1 TABLET DAILY 02/07/17  Yes Chipper Herb, MD  LORazepam (ATIVAN) 0.5 MG tablet TAKE ONE TABLET AT BEDTIME 09/20/17  Yes Chipper Herb, MD  memantine (NAMENDA) 10 MG tablet TAKE (1) TABLET TWICE A DAY. 06/28/17  Yes Chipper Herb, MD  mirtazapine (REMERON) 30 MG tablet TAKE ONE TABLET AT BEDTIME 09/14/17  Yes Chipper Herb, MD  pantoprazole (PROTONIX) 40 MG tablet TAKE 1 TABLET ONCE A DAY 04/15/17  Yes Chipper Herb, MD  polyethylene glycol powder (GLYCOLAX/MIRALAX) powder Take 17 g by mouth daily. 02/14/17  Yes Evelina Dun A, FNP  simvastatin (ZOCOR) 20 MG tablet TAKE ONE TABLET AT BEDTIME 04/25/17  Yes Chipper Herb, MD    Family History Family History  Problem Relation Age of Onset  . Stroke Sister        2 minor strokes  . Kidney disease Brother     Social History Social History   Tobacco Use  .  Smoking status: Never Smoker  . Smokeless tobacco: Never Used  Substance Use Topics  . Alcohol use: No  . Drug use: No     Allergies   Aspirin; Codeine phosphate; Darvocet [propoxyphene n-acetaminophen]; Glucosamine forte [nutritional supplements]; Naproxen; Nsaids; Sulfamethoxazole; and Sulfur dioxide   Review of Systems Review of Systems  Unable to perform ROS: Dementia     Physical Exam Updated Vital Signs BP (!) 168/79   Pulse (!) 59   Temp 98.1 F (36.7 C) (Oral)   Resp 20   Wt 80.3 kg (177 lb)   SpO2 100%   BMI 28.57 kg/m   Physical Exam  Constitutional: She appears well-developed and well-nourished.  HENT:  Head: Normocephalic and atraumatic.  Dry mucous membranes.  No evidence of scalp injury.  Eyes:  EOM are normal. Pupils are equal, round, and reactive to light.  Neck: Normal range of motion. Neck supple.  No meningismus.  No posterior midline cervical tenderness to palpation.  Cardiovascular: Normal rate and regular rhythm. Exam reveals no gallop and no friction rub.  No murmur heard. Pulmonary/Chest: Effort normal and breath sounds normal. No stridor. No respiratory distress. She has no wheezes. She has no rales. She exhibits no tenderness.  Abdominal: Soft. Bowel sounds are normal. There is no tenderness. There is no rebound and no guarding.  Musculoskeletal: Normal range of motion. She exhibits no edema or tenderness.  No lower extremity swelling, asymmetry or tenderness.  Distal pulses intact.  Neurological: She is alert.  Oriented only to self.  Repetitive.  Moves all extremities without focal deficit.  Sensation to light touch grossly intact.  Skin: Skin is warm and dry. Capillary refill takes less than 2 seconds. No rash noted. No erythema.  Psychiatric: She has a normal mood and affect.  Nursing note and vitals reviewed.    ED Treatments / Results  Labs (all labs ordered are listed, but only abnormal results are displayed) Labs Reviewed  CBC WITH DIFFERENTIAL/PLATELET - Abnormal; Notable for the following components:      Result Value   MCHC 29.9 (*)    All other components within normal limits  COMPREHENSIVE METABOLIC PANEL - Abnormal; Notable for the following components:   Glucose, Bld 101 (*)    Creatinine, Ser 1.22 (*)    ALT 11 (*)    GFR calc non Af Amer 39 (*)    GFR calc Af Amer 45 (*)    All other components within normal limits  ETHANOL  RAPID URINE DRUG SCREEN, HOSP PERFORMED  URINALYSIS, ROUTINE W REFLEX MICROSCOPIC    EKG  EKG Interpretation None       Radiology Ct Head Wo Contrast  Result Date: 09/24/2017 CLINICAL DATA:  Altered mental status. EXAM: CT HEAD WITHOUT CONTRAST TECHNIQUE: Contiguous axial images were obtained from the base of  the skull through the vertex without intravenous contrast. COMPARISON:  CT head dated September 21, 2011. FINDINGS: Brain: No evidence of acute infarction, hemorrhage, hydrocephalus, extra-axial collection or mass lesion/mass effect. Stable mild-to-moderate age related cerebral atrophy and mild chronic microvascular ischemic changes. Vascular: Calcified atherosclerosis at the skullbase. No hyperdense vessel. Skull: Negative for fracture or focal lesion. Sinuses/Orbits: No acute finding. Other: None. IMPRESSION: 1.  No acute intracranial abnormality. Electronically Signed   By: Titus Dubin M.D.   On: 09/24/2017 10:54    Procedures Procedures (including critical care time)  Medications Ordered in ED Medications  sodium chloride 0.9 % bolus 500 mL (500 mLs Intravenous New Bag/Given 09/24/17 1006)  Initial Impression / Assessment and Plan / ED Course  I have reviewed the triage vital signs and the nursing notes.  Pertinent labs & imaging results that were available during my care of the patient were reviewed by me and considered in my medical decision making (see chart for details).     Workup is essentially negative.  Normal CT head.  No evidence of infection.  Caretaker at bedside.  States she has episodes of anxiety and confusion but that she is currently at her baseline mental status.  Will arrange to have home health evaluate patient at her residence to see if she has any needs. Will discharge home with caretaker who will stay with the patient.  Return precautions given. Final Clinical Impressions(s) / ED Diagnoses   Final diagnoses:  Confusion    ED Discharge Orders        [place]     09/24/17 1135    Face-to-face encounter (required for Medicare/Medicaid patients)    Comments:  I Julianne Rice certify that this patient is under my care and that I, or a nurse practitioner or physician's assistant working with me, had a face-to-face encounter that meets the  physician face-to-face encounter requirements with this patient on 09/24/2017. The encounter with the patient was in whole, or in part for the following medical condition(s) which is the primary reason for home health care (List medical condition): Dementia   09/24/17 1135       Julianne Rice, MD 09/24/17 1135

## 2017-09-24 NOTE — ED Notes (Signed)
Pt assisted to dress, Caregiver given discharge instruction, verbalized understand. IV removed, band aid applied. Patient wheelchair out of the department.

## 2017-09-24 NOTE — ED Triage Notes (Signed)
Pt lives alone, pt called 911 instead of family this morning. Family states pt is at her baseline and did not want her to come to ED. EMS could not leave pt alone at home. So family told them to bring her to [facility], Per EMS, looked like pt had been outside working in flowers. Pt mouth is dry. Pt continue to repeat same stories. Saying she has been here before. No complaints.

## 2017-09-24 NOTE — ED Notes (Signed)
Pt ambulatory to the bathroom with assistance, Caregiver at the bedside.

## 2017-09-24 NOTE — ED Notes (Signed)
Patient given discharge instruction, verbalized understand. IV removed, band aid applied. Patient wheelchair out of the department.  

## 2017-09-24 NOTE — ED Notes (Signed)
Pt laughing, talking, repeating herself a lot, mouth very dry. Used wet sponge to clean and moisturize mouth.

## 2017-09-24 NOTE — ED Notes (Signed)
Caregiver at the bedside, giving pt home meds from this morning. Taking meds and pt home.

## 2017-09-26 ENCOUNTER — Other Ambulatory Visit: Payer: Self-pay | Admitting: Family Medicine

## 2017-09-26 NOTE — Care Management (Addendum)
Patient discharged from ER on 09/24/2017.  CM received voicemail about patient needing Home health on 09/26/2017. [address], [place], [phone]), no answer. Will give referral to [facility], who will obtain orders via Epic and call patient/POA.

## 2017-09-27 DIAGNOSIS — I10 Essential (primary) hypertension: Secondary | ICD-10-CM | POA: Diagnosis not present

## 2017-09-27 DIAGNOSIS — E785 Hyperlipidemia, unspecified: Secondary | ICD-10-CM | POA: Diagnosis not present

## 2017-09-27 DIAGNOSIS — J45909 Unspecified asthma, uncomplicated: Secondary | ICD-10-CM | POA: Diagnosis not present

## 2017-09-27 DIAGNOSIS — K219 Gastro-esophageal reflux disease without esophagitis: Secondary | ICD-10-CM | POA: Diagnosis not present

## 2017-09-27 DIAGNOSIS — M6281 Muscle weakness (generalized): Secondary | ICD-10-CM | POA: Diagnosis not present

## 2017-09-29 ENCOUNTER — Encounter: Payer: Self-pay | Admitting: Family Medicine

## 2017-09-29 LAB — HM DIABETES EYE EXAM

## 2017-09-30 ENCOUNTER — Telehealth: Payer: Self-pay | Admitting: Family Medicine

## 2017-09-30 DIAGNOSIS — H3582 Retinal ischemia: Secondary | ICD-10-CM

## 2017-09-30 DIAGNOSIS — J45909 Unspecified asthma, uncomplicated: Secondary | ICD-10-CM | POA: Diagnosis not present

## 2017-09-30 DIAGNOSIS — I10 Essential (primary) hypertension: Secondary | ICD-10-CM | POA: Diagnosis not present

## 2017-09-30 DIAGNOSIS — M6281 Muscle weakness (generalized): Secondary | ICD-10-CM | POA: Diagnosis not present

## 2017-09-30 DIAGNOSIS — H34231 Retinal artery branch occlusion, right eye: Secondary | ICD-10-CM

## 2017-09-30 DIAGNOSIS — K219 Gastro-esophageal reflux disease without esophagitis: Secondary | ICD-10-CM | POA: Diagnosis not present

## 2017-09-30 DIAGNOSIS — E785 Hyperlipidemia, unspecified: Secondary | ICD-10-CM | POA: Diagnosis not present

## 2017-09-30 NOTE — Telephone Encounter (Signed)
I assume that he wants her to have carotid Dopplers and this is okay.  Please order this and make sure that someone can get her to get this done.

## 2017-10-04 DIAGNOSIS — M6281 Muscle weakness (generalized): Secondary | ICD-10-CM | POA: Diagnosis not present

## 2017-10-04 DIAGNOSIS — E785 Hyperlipidemia, unspecified: Secondary | ICD-10-CM | POA: Diagnosis not present

## 2017-10-04 DIAGNOSIS — K219 Gastro-esophageal reflux disease without esophagitis: Secondary | ICD-10-CM | POA: Diagnosis not present

## 2017-10-04 DIAGNOSIS — I10 Essential (primary) hypertension: Secondary | ICD-10-CM | POA: Diagnosis not present

## 2017-10-04 DIAGNOSIS — J45909 Unspecified asthma, uncomplicated: Secondary | ICD-10-CM | POA: Diagnosis not present

## 2017-10-04 NOTE — Telephone Encounter (Signed)
I called the eye office / They are to send notes  We need to know what he found - diagnosis???

## 2017-10-06 ENCOUNTER — Other Ambulatory Visit: Payer: Self-pay | Admitting: Family Medicine

## 2017-10-07 DIAGNOSIS — I10 Essential (primary) hypertension: Secondary | ICD-10-CM | POA: Diagnosis not present

## 2017-10-07 DIAGNOSIS — J45909 Unspecified asthma, uncomplicated: Secondary | ICD-10-CM | POA: Diagnosis not present

## 2017-10-07 DIAGNOSIS — E785 Hyperlipidemia, unspecified: Secondary | ICD-10-CM | POA: Diagnosis not present

## 2017-10-07 DIAGNOSIS — M6281 Muscle weakness (generalized): Secondary | ICD-10-CM | POA: Diagnosis not present

## 2017-10-07 DIAGNOSIS — K219 Gastro-esophageal reflux disease without esophagitis: Secondary | ICD-10-CM | POA: Diagnosis not present

## 2017-10-12 DIAGNOSIS — E785 Hyperlipidemia, unspecified: Secondary | ICD-10-CM | POA: Diagnosis not present

## 2017-10-12 DIAGNOSIS — M6281 Muscle weakness (generalized): Secondary | ICD-10-CM | POA: Diagnosis not present

## 2017-10-12 DIAGNOSIS — I10 Essential (primary) hypertension: Secondary | ICD-10-CM | POA: Diagnosis not present

## 2017-10-12 DIAGNOSIS — K219 Gastro-esophageal reflux disease without esophagitis: Secondary | ICD-10-CM | POA: Diagnosis not present

## 2017-10-12 DIAGNOSIS — J45909 Unspecified asthma, uncomplicated: Secondary | ICD-10-CM | POA: Diagnosis not present

## 2017-10-14 ENCOUNTER — Ambulatory Visit (INDEPENDENT_AMBULATORY_CARE_PROVIDER_SITE_OTHER): Payer: Medicare Other

## 2017-10-14 DIAGNOSIS — I10 Essential (primary) hypertension: Secondary | ICD-10-CM

## 2017-10-14 DIAGNOSIS — J45909 Unspecified asthma, uncomplicated: Secondary | ICD-10-CM | POA: Diagnosis not present

## 2017-10-14 DIAGNOSIS — M6281 Muscle weakness (generalized): Secondary | ICD-10-CM

## 2017-10-14 DIAGNOSIS — F0390 Unspecified dementia without behavioral disturbance: Secondary | ICD-10-CM | POA: Diagnosis not present

## 2017-10-21 ENCOUNTER — Ambulatory Visit (HOSPITAL_COMMUNITY): Payer: Medicare Other

## 2017-10-25 ENCOUNTER — Other Ambulatory Visit: Payer: Self-pay | Admitting: Family Medicine

## 2017-10-27 DIAGNOSIS — M79642 Pain in left hand: Secondary | ICD-10-CM | POA: Diagnosis not present

## 2017-11-02 ENCOUNTER — Ambulatory Visit (HOSPITAL_COMMUNITY): Payer: Medicare Other

## 2017-11-02 ENCOUNTER — Ambulatory Visit (HOSPITAL_COMMUNITY)
Admission: RE | Admit: 2017-11-02 | Discharge: 2017-11-02 | Disposition: A | Discharge status: Home / Self Care | Payer: Medicare Other | Source: Ambulatory Visit | Attending: Family Medicine | Admitting: Family Medicine

## 2017-11-02 DIAGNOSIS — H34231 Retinal artery branch occlusion, right eye: Secondary | ICD-10-CM | POA: Diagnosis not present

## 2017-11-02 DIAGNOSIS — I6523 Occlusion and stenosis of bilateral carotid arteries: Secondary | ICD-10-CM | POA: Insufficient documentation

## 2017-11-02 DIAGNOSIS — H3582 Retinal ischemia: Secondary | ICD-10-CM | POA: Diagnosis not present

## 2017-11-07 ENCOUNTER — Other Ambulatory Visit: Payer: Self-pay | Admitting: Family Medicine

## 2017-11-25 ENCOUNTER — Other Ambulatory Visit: Payer: Self-pay | Admitting: Family Medicine

## 2017-11-25 NOTE — Telephone Encounter (Signed)
Next Ov Aug q6

## 2017-12-13 ENCOUNTER — Other Ambulatory Visit: Payer: Self-pay | Admitting: Family Medicine

## 2017-12-13 NOTE — Telephone Encounter (Signed)
Ov 02/22/18

## 2017-12-19 ENCOUNTER — Other Ambulatory Visit: Payer: Self-pay | Admitting: Family Medicine

## 2017-12-26 ENCOUNTER — Other Ambulatory Visit: Payer: Self-pay | Admitting: Family Medicine

## 2017-12-30 ENCOUNTER — Other Ambulatory Visit: Payer: Self-pay | Admitting: Family Medicine

## 2018-01-05 ENCOUNTER — Other Ambulatory Visit: Payer: Self-pay | Admitting: Family Medicine

## 2018-01-17 ENCOUNTER — Other Ambulatory Visit: Payer: Self-pay | Admitting: Family Medicine

## 2018-01-18 NOTE — Telephone Encounter (Signed)
Last seen 08/24/17  DWM

## 2018-01-23 ENCOUNTER — Other Ambulatory Visit: Payer: Self-pay | Admitting: Nurse Practitioner

## 2018-01-23 ENCOUNTER — Other Ambulatory Visit: Payer: Self-pay | Admitting: Family Medicine

## 2018-01-24 NOTE — Telephone Encounter (Signed)
Will give enough to last until Dr Laurance Flatten returns.  She legally needs to see him for this rx for continued refills.  The Narcotic Database has been reviewed.  There were no red flags.

## 2018-01-25 ENCOUNTER — Telehealth: Payer: Self-pay | Admitting: Family Medicine

## 2018-01-25 MED ORDER — LORAZEPAM 0.5 MG PO TABS: 0.5000 mg | ORAL_TABLET | Freq: Every day | ORAL | 0 refills | Status: DC

## 2018-01-25 NOTE — Telephone Encounter (Signed)
Medication has been set up for you and pending- please sign order.

## 2018-01-25 NOTE — Telephone Encounter (Signed)
Covering PCP- please advise  

## 2018-01-25 NOTE — Telephone Encounter (Signed)
Go ahead with enough for another month.

## 2018-01-25 NOTE — Telephone Encounter (Signed)
Patient suffers with dementia.  Famiy brings to her appointments.  Could a script for sufficient quantity to cover ,until follow up appointment in August, be sent in, please.

## 2018-01-25 NOTE — Telephone Encounter (Signed)
Aware. 

## 2018-01-25 NOTE — Telephone Encounter (Signed)
Aware rx sent yesterday to [facility].

## 2018-01-25 NOTE — Telephone Encounter (Signed)
I sent in the requested prescription 

## 2018-01-25 NOTE — Telephone Encounter (Signed)
The chart shows that Dr. Malachi Paradise those yesterday, and that the pharmacy received the prescription yesterday. I would rather not duplicate since it is a controlled drug. Thanks, WS

## 2018-01-25 NOTE — Telephone Encounter (Signed)
Patient nephew is calling back stating the pharmacy is telling them she has to be seen for refills on the Ativan. I called [facility] and spoke with Faythe Dingwall she states they gave her the 7 days for her box that went out today, but her next pill box has to go out next Wednesday.  There will not be enough in there to cover her. So does she need to be seen by another provider until Laurance Flatten gets back or will someone call in enough till he gets back or will she have to go with out. Please advise. Patient has follow up scheduled with Laurance Flatten on 08/14 for her 6 month follow up.

## 2018-01-28 ENCOUNTER — Other Ambulatory Visit: Payer: Self-pay | Admitting: Family Medicine

## 2018-02-13 ENCOUNTER — Other Ambulatory Visit: Payer: Self-pay | Admitting: Family Medicine

## 2018-02-16 ENCOUNTER — Other Ambulatory Visit: Payer: Medicare Other

## 2018-02-16 DIAGNOSIS — R300 Dysuria: Secondary | ICD-10-CM

## 2018-02-17 ENCOUNTER — Other Ambulatory Visit: Payer: Medicare Other

## 2018-02-17 DIAGNOSIS — R300 Dysuria: Secondary | ICD-10-CM | POA: Diagnosis not present

## 2018-02-19 LAB — URINE CULTURE

## 2018-02-20 ENCOUNTER — Other Ambulatory Visit: Payer: Self-pay | Admitting: Family Medicine

## 2018-02-20 NOTE — Telephone Encounter (Signed)
Last seen 08/24/17

## 2018-02-21 LAB — MICROSCOPIC EXAMINATION
BACTERIA UA: NONE SEEN
RBC, UA: NONE SEEN /hpf (ref 0–2)
Renal Epithel, UA: NONE SEEN /hpf
WBC, UA: NONE SEEN /hpf (ref 0–5)

## 2018-02-21 LAB — URINALYSIS, COMPLETE
Bilirubin, UA: NEGATIVE
GLUCOSE, UA: NEGATIVE
Leukocytes, UA: NEGATIVE
Nitrite, UA: NEGATIVE
PROTEIN UA: NEGATIVE
RBC, UA: NEGATIVE
Specific Gravity, UA: >1.03 — ABNORMAL HIGH (ref 1.005–1.030)
Urobilinogen, Ur: 0.2 mg/dL (ref 0.2–1.0)
pH, UA: 5 (ref 5.0–7.5)

## 2018-02-22 ENCOUNTER — Encounter: Payer: Self-pay | Admitting: Family Medicine

## 2018-02-22 ENCOUNTER — Ambulatory Visit: Payer: Medicare Other | Admitting: Family Medicine

## 2018-02-22 VITALS — BP 167/82 | HR 74 | Temp 97.4°F | Ht 66.0 in | Wt 169.0 lb

## 2018-02-22 DIAGNOSIS — R410 Disorientation, unspecified: Secondary | ICD-10-CM | POA: Diagnosis not present

## 2018-02-22 DIAGNOSIS — D75839 Thrombocytosis, unspecified: Secondary | ICD-10-CM

## 2018-02-22 DIAGNOSIS — E039 Hypothyroidism, unspecified: Secondary | ICD-10-CM | POA: Diagnosis not present

## 2018-02-22 DIAGNOSIS — E7800 Pure hypercholesterolemia, unspecified: Secondary | ICD-10-CM

## 2018-02-22 DIAGNOSIS — I10 Essential (primary) hypertension: Secondary | ICD-10-CM

## 2018-02-22 DIAGNOSIS — D473 Essential (hemorrhagic) thrombocythemia: Secondary | ICD-10-CM

## 2018-02-22 DIAGNOSIS — E559 Vitamin D deficiency, unspecified: Secondary | ICD-10-CM | POA: Diagnosis not present

## 2018-02-22 DIAGNOSIS — F419 Anxiety disorder, unspecified: Secondary | ICD-10-CM

## 2018-02-22 DIAGNOSIS — F0150 Vascular dementia without behavioral disturbance: Secondary | ICD-10-CM

## 2018-02-22 NOTE — Patient Instructions (Addendum)
Medicare Annual Wellness Visit  [facility] and the medical providers at [facility] strive to bring you the best medical care.  In doing so we not only want to address your current medical conditions and concerns but also to detect new conditions early and prevent illness, disease and health-related problems.    Medicare offers a yearly Wellness Visit which allows our clinical staff to assess your need for preventative services including immunizations, lifestyle education, counseling to decrease risk of preventable diseases and screening for fall risk and other medical concerns.    This visit is provided free of charge (no copay) for all Medicare recipients. The clinical pharmacists at [facility] have begun to conduct these Wellness Visits which will also include a thorough review of all your medications.    As you primary medical provider recommend that you make an appointment for your Annual Wellness Visit if you have not done so already this year.  You may set up this appointment before you leave today or you may call back ([phone]) and schedule an appointment.  Please make sure when you call that you mention that you are scheduling your Annual Wellness Visit with the clinical pharmacist so that the appointment may be made for the proper length of time.     Continue current medications. Continue good therapeutic lifestyle changes which include good diet and exercise. Fall precautions discussed with patient. If an FOBT was given today- please return it to our front desk. If you are over 19 years old - you may need Prevnar 34 or the adult Pneumonia vaccine.  **Flu shots are available--- please call and schedule a FLU-CLINIC appointment**  After your visit with [place] today you will receive a survey in the mail or online from [employer] regarding your care with [place]. Please take a moment to fill this out. Your feedback is very  important to [place] as you can help [place] better understand your patient needs as well as improve your experience and satisfaction. WE CARE ABOUT YOU!!!   Take Tylenol for pain and drink plenty of fluids and stay well-hydrated Careful not to put yourself at risk for falling Move slowly Continue current medicines

## 2018-02-22 NOTE — Progress Notes (Signed)
Subjective:    Patient ID: Traci Wheeler, female    DOB: August 03, 1927, 84 y.o.   MRN: [id number]  HPI Pt here for follow up and management of chronic medical problems which includes hypertension and hyperlipidemia. She is taking medication regularly.  The patient is doing well overall.  Her biggest complaint today is her back pain.  There is some ongoing discussion regarding skilled care with Medicaid and social worker.  Her power of attorney and relative [name] comes with her to the visit today.  The patient has done very well considering she lives by herself and she has people come in and stay with her on a daily basis.  They make sure that she gets her medication.  She is taking quite a few different pills.  Her biggest issues obviously are her declining memory anxiety hypothyroidism hypertension hyperlipidemia and depression.    Patient Active Problem List   Diagnosis Date Noted  . Hypothyroidism 06/15/2017  . Anxiety 06/15/2017  . Confusion 06/15/2017  . Thrombocytosis ([place]) 12/10/2016  . Malaise and fatigue 10/01/2013  . Essential hypertension 12/14/2012  . Hyperlipemia 12/14/2012  . Dementia 12/11/2012  . Depression 10/04/2012  . ALLERGIC RHINITIS 07/19/2007  . ASTHMA 07/19/2007  . ESOPHAGEAL REFLUX 07/19/2007   Outpatient Encounter Medications as of 02/22/2018  Medication Sig  . amLODipine (NORVASC) 10 MG tablet TAKE (1) TABLET DAILY IN THE MORNING.  Marland Kitchen buPROPion (WELLBUTRIN XL) 300 MG 24 hr tablet TAKE 1 TABLET DAILY  . busPIRone (BUSPAR) 15 MG tablet TAKE 1 TABLET EVERY MORNING  . donepezil (ARICEPT) 10 MG tablet TAKE ONE TABLET AT BEDTIME  . DULoxetine (CYMBALTA) 60 MG capsule TAKE (1) CAPSULE DAILY  . lamoTRIgine (LAMICTAL) 25 MG tablet TAKE (1) TABLET TWICE A DAY.  Marland Kitchen levothyroxine (SYNTHROID, LEVOTHROID) 50 MCG tablet TAKE 1 TABLET DAILY  . LORazepam (ATIVAN) 0.5 MG tablet Take 1 tablet (0.5 mg total) by mouth at bedtime.  . memantine (NAMENDA) 10 MG tablet TAKE  (1) TABLET TWICE A DAY.  . mirtazapine (REMERON) 30 MG tablet TAKE ONE TABLET AT BEDTIME  . pantoprazole (PROTONIX) 40 MG tablet TAKE 1 TABLET ONCE A DAY  . polyethylene glycol powder (GLYCOLAX/MIRALAX) powder Take 17 g by mouth daily.  . simvastatin (ZOCOR) 20 MG tablet TAKE ONE TABLET AT BEDTIME  . [DISCONTINUED] buPROPion (WELLBUTRIN XL) 300 MG 24 hr tablet TAKE 1 TABLET DAILY  . [DISCONTINUED] buPROPion (WELLBUTRIN XL) 300 MG 24 hr tablet TAKE 1 TABLET DAILY  . [DISCONTINUED] donepezil (ARICEPT) 10 MG tablet TAKE ONE TABLET AT BEDTIME   No facility-administered encounter medications on file as of 02/22/2018.       Review of Systems  Constitutional: Negative.   HENT: Negative.   Eyes: Negative.   Respiratory: Negative.   Cardiovascular: Negative.   Gastrointestinal: Negative.   Endocrine: Negative.   Genitourinary: Negative.   Musculoskeletal: Positive for back pain.  Skin: Negative.   Allergic/Immunologic: Negative.   Neurological: Negative.   Hematological: Negative.   Psychiatric/Behavioral: Negative.        Objective:   Physical Exam  Constitutional: She is oriented to person, place, and time. She appears well-developed and well-nourished.  Patient is smiling pleasant and alert and in good spirits today.  She obviously has the ongoing dementia and memory impairment.  HENT:  Head: Normocephalic and atraumatic.  Right Ear: External ear normal.  Left Ear: External ear normal.  Nose: Nose normal.  Mouth/Throat: Oropharynx is clear and moist. No oropharyngeal exudate.  Eyes:  Pupils are equal, round, and reactive to light. Conjunctivae and EOM are normal. Right eye exhibits no discharge. Left eye exhibits no discharge. No scleral icterus.  Neck: Normal range of motion. Neck supple. No thyromegaly present.  No bruits thyromegaly or anterior cervical adenopathy  Cardiovascular: Normal rate, regular rhythm, normal heart sounds and intact distal pulses. Exam reveals no gallop.   No murmur heard. Heart is regular today at 72/min with good pedal pulses.  Pulmonary/Chest: Effort normal and breath sounds normal. She has no wheezes. She has no rales.  Clear anteriorly and posteriorly with no wheezing  Abdominal: Soft. Bowel sounds are normal. She exhibits no mass. There is no tenderness.  Musculoskeletal: Normal range of motion. She exhibits no edema.  Mild gait instability but no need of cane or walker.  Lymphadenopathy:    She has no cervical adenopathy.  Neurological: She is alert and oriented to person, place, and time. She has normal reflexes. No cranial nerve deficit.  Ongoing memory impairment may be slightly worse than in the past.  Skin: Skin is warm and dry. No rash noted.  Psychiatric: She has a normal mood and affect. Her behavior is normal. Judgment and thought content normal.  Patient's mood affect and behavior are normal for her.  Nursing note and vitals reviewed.   BP (!) 167/82 (BP Location: Left Arm)   Pulse 74   Temp (!) 97.4 F (36.3 C) (Oral)   Ht 5' 6" (1.676 m)   Wt 169 lb (76.7 kg)   BMI 27.28 kg/m        Assessment & Plan:  1. Hypothyroidism, unspecified type -Continue current treatment pending results of lab work - CBC with Differential/Platelet - Thyroid Panel With TSH  2. Essential hypertension -Blood pressure is elevated today on the systolic side but no change in treatment - CBC with Differential/Platelet - BMP8+EGFR - Hepatic function panel  3. Pure hypercholesterolemia -Continue simvastatin and as aggressive therapeutic lifestyle changes as possible and lots of water. - CBC with Differential/Platelet - Lipid panel  4. Confusion -Continue Aricept and Namenda - CBC with Differential/Platelet  5. Vitamin D deficiency -Continue vitamin D replacement - CBC with Differential/Platelet - VITAMIN D 25 Hydroxy (Vit-D Deficiency, Fractures)  6. Thrombocytosis ([facility]) - CBC with Differential/Platelet  7. Vascular  dementia without behavioral disturbance -Continue current treatments for dementia and healthy eating habits and lots of water  8. Anxiety -This seems to be stable and she is doing well with her current treatment regimen.  Patient Instructions                       Medicare Annual Wellness Visit  [facility] and the medical providers at [facility] strive to bring you the best medical care.  In doing so we not only want to address your current medical conditions and concerns but also to detect new conditions early and prevent illness, disease and health-related problems.    Medicare offers a yearly Wellness Visit which allows our clinical staff to assess your need for preventative services including immunizations, lifestyle education, counseling to decrease risk of preventable diseases and screening for fall risk and other medical concerns.    This visit is provided free of charge (no copay) for all Medicare recipients. The clinical pharmacists at [facility] have begun to conduct these Wellness Visits which will also include a thorough review of all your medications.    As you primary medical provider recommend that  you make an appointment for your Annual Wellness Visit if you have not done so already this year.  You may set up this appointment before you leave today or you may call back ([phone]) and schedule an appointment.  Please make sure when you call that you mention that you are scheduling your Annual Wellness Visit with the clinical pharmacist so that the appointment may be made for the proper length of time.     Continue current medications. Continue good therapeutic lifestyle changes which include good diet and exercise. Fall precautions discussed with patient. If an FOBT was given today- please return it to our front desk. If you are over 45 years old - you may need Prevnar 49 or the adult Pneumonia vaccine.  **Flu shots are  available--- please call and schedule a FLU-CLINIC appointment**  After your visit with [place] today you will receive a survey in the mail or online from [employer] regarding your care with [place]. Please take a moment to fill this out. Your feedback is very important to [place] as you can help [place] better understand your patient needs as well as improve your experience and satisfaction. WE CARE ABOUT YOU!!!   Take Tylenol for pain and drink plenty of fluids and stay well-hydrated Careful not to put yourself at risk for falling Move slowly Continue current medicines  Arrie Senate MD

## 2018-02-23 LAB — CBC WITH DIFFERENTIAL/PLATELET
BASOS ABS: 0 10*3/uL (ref 0.0–0.2)
BASOS: 1 %
EOS (ABSOLUTE): 0.2 10*3/uL (ref 0.0–0.4)
EOS: 3 %
HEMATOCRIT: 39 % (ref 34.0–46.6)
HEMOGLOBIN: 12.6 g/dL (ref 11.1–15.9)
IMMATURE GRANS (ABS): 0 10*3/uL (ref 0.0–0.1)
Immature Granulocytes: 0 %
LYMPHS ABS: 1.8 10*3/uL (ref 0.7–3.1)
LYMPHS: 22 %
MCH: 29.5 pg (ref 26.6–33.0)
MCHC: 32.3 g/dL (ref 31.5–35.7)
MCV: 91 fL (ref 79–97)
MONOCYTES: 9 %
Monocytes Absolute: 0.7 10*3/uL (ref 0.1–0.9)
NEUTROS ABS: 5.6 10*3/uL (ref 1.4–7.0)
Neutrophils: 65 %
Platelets: 403 10*3/uL (ref 150–450)
RBC: 4.27 x10E6/uL (ref 3.77–5.28)
RDW: 14 % (ref 12.3–15.4)
WBC: 8.4 10*3/uL (ref 3.4–10.8)

## 2018-02-23 LAB — LIPID PANEL
CHOL/HDL RATIO: 3.6 ratio (ref 0.0–4.4)
CHOLESTEROL TOTAL: 193 mg/dL (ref 100–199)
HDL: 53 mg/dL (ref >39)
LDL CALC: 97 mg/dL (ref 0–99)
TRIGLYCERIDES: 217 mg/dL — AB (ref 0–149)
VLDL Cholesterol Cal: 43 mg/dL — ABNORMAL HIGH (ref 5–40)

## 2018-02-23 LAB — BMP8+EGFR
BUN / CREAT RATIO: 10 — AB (ref 12–28)
BUN: 16 mg/dL (ref 8–27)
CO2: 24 mmol/L (ref 20–29)
CREATININE: 1.61 mg/dL — AB (ref 0.57–1.00)
Calcium: 10 mg/dL (ref 8.7–10.3)
Chloride: 102 mmol/L (ref 96–106)
GFR calc Af Amer: 33 mL/min/{1.73_m2} — ABNORMAL LOW (ref >59)
GFR, EST NON AFRICAN AMERICAN: 29 mL/min/{1.73_m2} — AB (ref >59)
GLUCOSE: 94 mg/dL (ref 65–99)
Potassium: 4.2 mmol/L (ref 3.5–5.2)
Sodium: 143 mmol/L (ref 134–144)

## 2018-02-23 LAB — HEPATIC FUNCTION PANEL
ALK PHOS: 110 IU/L (ref 39–117)
ALT: 9 IU/L (ref 0–32)
AST: 17 IU/L (ref 0–40)
Albumin: 4.4 g/dL (ref 3.5–4.7)
Bilirubin Total: 0.3 mg/dL (ref 0.0–1.2)
Bilirubin, Direct: 0.1 mg/dL (ref 0.00–0.40)
TOTAL PROTEIN: 7.1 g/dL (ref 6.0–8.5)

## 2018-02-23 LAB — THYROID PANEL WITH TSH
Free Thyroxine Index: 2.4 (ref 1.2–4.9)
T3 UPTAKE RATIO: 25 % (ref 24–39)
T4 TOTAL: 9.7 ug/dL (ref 4.5–12.0)
TSH: 1.69 u[IU]/mL (ref 0.450–4.500)

## 2018-02-23 LAB — VITAMIN D 25 HYDROXY (VIT D DEFICIENCY, FRACTURES): Vit D, 25-Hydroxy: 31 ng/mL (ref 30.0–100.0)

## 2018-02-27 ENCOUNTER — Other Ambulatory Visit: Payer: Self-pay | Admitting: Family Medicine

## 2018-03-06 ENCOUNTER — Telehealth: Payer: Self-pay | Admitting: Family Medicine

## 2018-03-06 NOTE — Telephone Encounter (Signed)
Traci Wheeler somehow contacted someone she didn't know the other day and somehow EMS and the Sheriff's office came to her house.  [name] is not sure if she is taking her meds corrrectly, taking at all.  He is coming up tomorrow and plans to speak with one of her neighbors to see if she will come over each morning and make sure she is taking her meds.   He is upset and not sure what to do but would like to know if Dr. Laurance Flatten would be willing to have an FL2 form filled out and ready to use if needed.  Please advise.

## 2018-03-06 NOTE — Telephone Encounter (Signed)
Traci Wheeler, can we work on this?

## 2018-03-14 ENCOUNTER — Other Ambulatory Visit: Payer: Self-pay | Admitting: Family Medicine

## 2018-03-20 ENCOUNTER — Other Ambulatory Visit: Payer: Self-pay | Admitting: Family Medicine

## 2018-03-21 ENCOUNTER — Other Ambulatory Visit: Payer: Self-pay | Admitting: Family Medicine

## 2018-03-23 ENCOUNTER — Telehealth: Payer: Self-pay | Admitting: Family Medicine

## 2018-03-23 NOTE — Telephone Encounter (Signed)
Aware to bring paper work and schedule with provider when they have decided on facility.

## 2018-03-23 NOTE — Telephone Encounter (Signed)
Calling stating that her dementia is getting worse and they have found her in some dangerous situations.  They want to start talking about putting her somewhere. Please advise. They rather just speak with Dr. Laurance Flatten.

## 2018-03-25 ENCOUNTER — Other Ambulatory Visit: Payer: Self-pay | Admitting: Family Medicine

## 2018-04-08 DIAGNOSIS — M6281 Muscle weakness (generalized): Secondary | ICD-10-CM | POA: Diagnosis not present

## 2018-04-08 DIAGNOSIS — R1310 Dysphagia, unspecified: Secondary | ICD-10-CM | POA: Diagnosis not present

## 2018-04-08 DIAGNOSIS — R278 Other lack of coordination: Secondary | ICD-10-CM | POA: Diagnosis not present

## 2018-04-10 ENCOUNTER — Other Ambulatory Visit: Payer: Self-pay | Admitting: *Deleted

## 2018-04-10 DIAGNOSIS — R278 Other lack of coordination: Secondary | ICD-10-CM | POA: Diagnosis not present

## 2018-04-10 DIAGNOSIS — R1310 Dysphagia, unspecified: Secondary | ICD-10-CM | POA: Diagnosis not present

## 2018-04-10 DIAGNOSIS — M6281 Muscle weakness (generalized): Secondary | ICD-10-CM | POA: Diagnosis not present

## 2018-04-11 DIAGNOSIS — R1310 Dysphagia, unspecified: Secondary | ICD-10-CM | POA: Diagnosis not present

## 2018-04-11 DIAGNOSIS — M6281 Muscle weakness (generalized): Secondary | ICD-10-CM | POA: Diagnosis not present

## 2018-04-11 DIAGNOSIS — R278 Other lack of coordination: Secondary | ICD-10-CM | POA: Diagnosis not present

## 2018-04-12 DIAGNOSIS — R1310 Dysphagia, unspecified: Secondary | ICD-10-CM | POA: Diagnosis not present

## 2018-04-12 DIAGNOSIS — R278 Other lack of coordination: Secondary | ICD-10-CM | POA: Diagnosis not present

## 2018-04-12 DIAGNOSIS — M6281 Muscle weakness (generalized): Secondary | ICD-10-CM | POA: Diagnosis not present

## 2018-04-13 DIAGNOSIS — J310 Chronic rhinitis: Secondary | ICD-10-CM | POA: Diagnosis not present

## 2018-04-13 DIAGNOSIS — I10 Essential (primary) hypertension: Secondary | ICD-10-CM | POA: Diagnosis not present

## 2018-04-13 DIAGNOSIS — M6281 Muscle weakness (generalized): Secondary | ICD-10-CM | POA: Diagnosis not present

## 2018-04-13 DIAGNOSIS — R278 Other lack of coordination: Secondary | ICD-10-CM | POA: Diagnosis not present

## 2018-04-13 DIAGNOSIS — J4520 Mild intermittent asthma, uncomplicated: Secondary | ICD-10-CM | POA: Diagnosis not present

## 2018-04-13 DIAGNOSIS — K219 Gastro-esophageal reflux disease without esophagitis: Secondary | ICD-10-CM | POA: Diagnosis not present

## 2018-04-13 DIAGNOSIS — R1310 Dysphagia, unspecified: Secondary | ICD-10-CM | POA: Diagnosis not present

## 2018-04-14 DIAGNOSIS — R278 Other lack of coordination: Secondary | ICD-10-CM | POA: Diagnosis not present

## 2018-04-14 DIAGNOSIS — M6281 Muscle weakness (generalized): Secondary | ICD-10-CM | POA: Diagnosis not present

## 2018-04-14 DIAGNOSIS — R1310 Dysphagia, unspecified: Secondary | ICD-10-CM | POA: Diagnosis not present

## 2018-04-17 DIAGNOSIS — E039 Hypothyroidism, unspecified: Secondary | ICD-10-CM | POA: Diagnosis not present

## 2018-04-17 DIAGNOSIS — R1310 Dysphagia, unspecified: Secondary | ICD-10-CM | POA: Diagnosis not present

## 2018-04-17 DIAGNOSIS — E782 Mixed hyperlipidemia: Secondary | ICD-10-CM | POA: Diagnosis not present

## 2018-04-17 DIAGNOSIS — Z79899 Other long term (current) drug therapy: Secondary | ICD-10-CM | POA: Diagnosis not present

## 2018-04-17 DIAGNOSIS — R278 Other lack of coordination: Secondary | ICD-10-CM | POA: Diagnosis not present

## 2018-04-17 DIAGNOSIS — D649 Anemia, unspecified: Secondary | ICD-10-CM | POA: Diagnosis not present

## 2018-04-17 DIAGNOSIS — E119 Type 2 diabetes mellitus without complications: Secondary | ICD-10-CM | POA: Diagnosis not present

## 2018-04-17 DIAGNOSIS — M6281 Muscle weakness (generalized): Secondary | ICD-10-CM | POA: Diagnosis not present

## 2018-04-18 DIAGNOSIS — R278 Other lack of coordination: Secondary | ICD-10-CM | POA: Diagnosis not present

## 2018-04-18 DIAGNOSIS — R1310 Dysphagia, unspecified: Secondary | ICD-10-CM | POA: Diagnosis not present

## 2018-04-18 DIAGNOSIS — M6281 Muscle weakness (generalized): Secondary | ICD-10-CM | POA: Diagnosis not present

## 2018-04-19 DIAGNOSIS — R1310 Dysphagia, unspecified: Secondary | ICD-10-CM | POA: Diagnosis not present

## 2018-04-19 DIAGNOSIS — R278 Other lack of coordination: Secondary | ICD-10-CM | POA: Diagnosis not present

## 2018-04-19 DIAGNOSIS — M6281 Muscle weakness (generalized): Secondary | ICD-10-CM | POA: Diagnosis not present

## 2018-04-20 DIAGNOSIS — M6281 Muscle weakness (generalized): Secondary | ICD-10-CM | POA: Diagnosis not present

## 2018-04-20 DIAGNOSIS — R1310 Dysphagia, unspecified: Secondary | ICD-10-CM | POA: Diagnosis not present

## 2018-04-20 DIAGNOSIS — R278 Other lack of coordination: Secondary | ICD-10-CM | POA: Diagnosis not present

## 2018-04-21 DIAGNOSIS — R278 Other lack of coordination: Secondary | ICD-10-CM | POA: Diagnosis not present

## 2018-04-21 DIAGNOSIS — R1310 Dysphagia, unspecified: Secondary | ICD-10-CM | POA: Diagnosis not present

## 2018-04-21 DIAGNOSIS — M6281 Muscle weakness (generalized): Secondary | ICD-10-CM | POA: Diagnosis not present

## 2018-04-22 DIAGNOSIS — M6281 Muscle weakness (generalized): Secondary | ICD-10-CM | POA: Diagnosis not present

## 2018-04-22 DIAGNOSIS — R278 Other lack of coordination: Secondary | ICD-10-CM | POA: Diagnosis not present

## 2018-04-22 DIAGNOSIS — R1310 Dysphagia, unspecified: Secondary | ICD-10-CM | POA: Diagnosis not present

## 2018-04-24 DIAGNOSIS — M6281 Muscle weakness (generalized): Secondary | ICD-10-CM | POA: Diagnosis not present

## 2018-04-24 DIAGNOSIS — R278 Other lack of coordination: Secondary | ICD-10-CM | POA: Diagnosis not present

## 2018-04-24 DIAGNOSIS — R1310 Dysphagia, unspecified: Secondary | ICD-10-CM | POA: Diagnosis not present

## 2018-04-25 DIAGNOSIS — R278 Other lack of coordination: Secondary | ICD-10-CM | POA: Diagnosis not present

## 2018-04-25 DIAGNOSIS — R1310 Dysphagia, unspecified: Secondary | ICD-10-CM | POA: Diagnosis not present

## 2018-04-25 DIAGNOSIS — I10 Essential (primary) hypertension: Secondary | ICD-10-CM | POA: Diagnosis not present

## 2018-04-25 DIAGNOSIS — E039 Hypothyroidism, unspecified: Secondary | ICD-10-CM | POA: Diagnosis not present

## 2018-04-25 DIAGNOSIS — J4520 Mild intermittent asthma, uncomplicated: Secondary | ICD-10-CM | POA: Diagnosis not present

## 2018-04-25 DIAGNOSIS — M6281 Muscle weakness (generalized): Secondary | ICD-10-CM | POA: Diagnosis not present

## 2018-04-26 DIAGNOSIS — R278 Other lack of coordination: Secondary | ICD-10-CM | POA: Diagnosis not present

## 2018-04-26 DIAGNOSIS — M6281 Muscle weakness (generalized): Secondary | ICD-10-CM | POA: Diagnosis not present

## 2018-04-26 DIAGNOSIS — R1310 Dysphagia, unspecified: Secondary | ICD-10-CM | POA: Diagnosis not present

## 2018-04-27 DIAGNOSIS — R1310 Dysphagia, unspecified: Secondary | ICD-10-CM | POA: Diagnosis not present

## 2018-04-27 DIAGNOSIS — M6281 Muscle weakness (generalized): Secondary | ICD-10-CM | POA: Diagnosis not present

## 2018-04-27 DIAGNOSIS — R278 Other lack of coordination: Secondary | ICD-10-CM | POA: Diagnosis not present

## 2018-04-28 DIAGNOSIS — M6281 Muscle weakness (generalized): Secondary | ICD-10-CM | POA: Diagnosis not present

## 2018-04-28 DIAGNOSIS — R1310 Dysphagia, unspecified: Secondary | ICD-10-CM | POA: Diagnosis not present

## 2018-04-28 DIAGNOSIS — R278 Other lack of coordination: Secondary | ICD-10-CM | POA: Diagnosis not present

## 2018-04-29 DIAGNOSIS — R1310 Dysphagia, unspecified: Secondary | ICD-10-CM | POA: Diagnosis not present

## 2018-04-29 DIAGNOSIS — R278 Other lack of coordination: Secondary | ICD-10-CM | POA: Diagnosis not present

## 2018-04-29 DIAGNOSIS — M6281 Muscle weakness (generalized): Secondary | ICD-10-CM | POA: Diagnosis not present

## 2018-04-30 DIAGNOSIS — M6281 Muscle weakness (generalized): Secondary | ICD-10-CM | POA: Diagnosis not present

## 2018-04-30 DIAGNOSIS — R1310 Dysphagia, unspecified: Secondary | ICD-10-CM | POA: Diagnosis not present

## 2018-04-30 DIAGNOSIS — R278 Other lack of coordination: Secondary | ICD-10-CM | POA: Diagnosis not present

## 2018-05-01 DIAGNOSIS — R1310 Dysphagia, unspecified: Secondary | ICD-10-CM | POA: Diagnosis not present

## 2018-05-01 DIAGNOSIS — M6281 Muscle weakness (generalized): Secondary | ICD-10-CM | POA: Diagnosis not present

## 2018-05-01 DIAGNOSIS — R278 Other lack of coordination: Secondary | ICD-10-CM | POA: Diagnosis not present

## 2018-05-02 DIAGNOSIS — R1310 Dysphagia, unspecified: Secondary | ICD-10-CM | POA: Diagnosis not present

## 2018-05-02 DIAGNOSIS — Z79899 Other long term (current) drug therapy: Secondary | ICD-10-CM | POA: Diagnosis not present

## 2018-05-02 DIAGNOSIS — D649 Anemia, unspecified: Secondary | ICD-10-CM | POA: Diagnosis not present

## 2018-05-02 DIAGNOSIS — M6281 Muscle weakness (generalized): Secondary | ICD-10-CM | POA: Diagnosis not present

## 2018-05-02 DIAGNOSIS — R278 Other lack of coordination: Secondary | ICD-10-CM | POA: Diagnosis not present

## 2018-05-03 DIAGNOSIS — R278 Other lack of coordination: Secondary | ICD-10-CM | POA: Diagnosis not present

## 2018-05-03 DIAGNOSIS — R1310 Dysphagia, unspecified: Secondary | ICD-10-CM | POA: Diagnosis not present

## 2018-05-03 DIAGNOSIS — M6281 Muscle weakness (generalized): Secondary | ICD-10-CM | POA: Diagnosis not present

## 2018-05-04 DIAGNOSIS — R1310 Dysphagia, unspecified: Secondary | ICD-10-CM | POA: Diagnosis not present

## 2018-05-04 DIAGNOSIS — R278 Other lack of coordination: Secondary | ICD-10-CM | POA: Diagnosis not present

## 2018-05-04 DIAGNOSIS — M6281 Muscle weakness (generalized): Secondary | ICD-10-CM | POA: Diagnosis not present

## 2018-05-05 DIAGNOSIS — M6281 Muscle weakness (generalized): Secondary | ICD-10-CM | POA: Diagnosis not present

## 2018-05-05 DIAGNOSIS — R1310 Dysphagia, unspecified: Secondary | ICD-10-CM | POA: Diagnosis not present

## 2018-05-05 DIAGNOSIS — R278 Other lack of coordination: Secondary | ICD-10-CM | POA: Diagnosis not present

## 2018-05-08 DIAGNOSIS — R1310 Dysphagia, unspecified: Secondary | ICD-10-CM | POA: Diagnosis not present

## 2018-05-08 DIAGNOSIS — R278 Other lack of coordination: Secondary | ICD-10-CM | POA: Diagnosis not present

## 2018-05-08 DIAGNOSIS — M6281 Muscle weakness (generalized): Secondary | ICD-10-CM | POA: Diagnosis not present

## 2018-05-09 DIAGNOSIS — M6281 Muscle weakness (generalized): Secondary | ICD-10-CM | POA: Diagnosis not present

## 2018-05-09 DIAGNOSIS — R278 Other lack of coordination: Secondary | ICD-10-CM | POA: Diagnosis not present

## 2018-05-09 DIAGNOSIS — R1310 Dysphagia, unspecified: Secondary | ICD-10-CM | POA: Diagnosis not present

## 2018-05-10 DIAGNOSIS — M6281 Muscle weakness (generalized): Secondary | ICD-10-CM | POA: Diagnosis not present

## 2018-05-10 DIAGNOSIS — R278 Other lack of coordination: Secondary | ICD-10-CM | POA: Diagnosis not present

## 2018-05-10 DIAGNOSIS — R1310 Dysphagia, unspecified: Secondary | ICD-10-CM | POA: Diagnosis not present

## 2018-05-11 DIAGNOSIS — R278 Other lack of coordination: Secondary | ICD-10-CM | POA: Diagnosis not present

## 2018-05-11 DIAGNOSIS — M6281 Muscle weakness (generalized): Secondary | ICD-10-CM | POA: Diagnosis not present

## 2018-05-11 DIAGNOSIS — R1310 Dysphagia, unspecified: Secondary | ICD-10-CM | POA: Diagnosis not present

## 2018-05-12 DIAGNOSIS — B351 Tinea unguium: Secondary | ICD-10-CM | POA: Diagnosis not present

## 2018-05-12 DIAGNOSIS — L603 Nail dystrophy: Secondary | ICD-10-CM | POA: Diagnosis not present

## 2018-05-12 DIAGNOSIS — I739 Peripheral vascular disease, unspecified: Secondary | ICD-10-CM | POA: Diagnosis not present

## 2018-06-13 DIAGNOSIS — J4520 Mild intermittent asthma, uncomplicated: Secondary | ICD-10-CM | POA: Diagnosis not present

## 2018-06-13 DIAGNOSIS — E039 Hypothyroidism, unspecified: Secondary | ICD-10-CM | POA: Diagnosis not present

## 2018-06-13 DIAGNOSIS — I10 Essential (primary) hypertension: Secondary | ICD-10-CM | POA: Diagnosis not present

## 2018-06-19 DIAGNOSIS — R278 Other lack of coordination: Secondary | ICD-10-CM | POA: Diagnosis not present

## 2018-06-20 DIAGNOSIS — R278 Other lack of coordination: Secondary | ICD-10-CM | POA: Diagnosis not present

## 2018-06-21 DIAGNOSIS — R278 Other lack of coordination: Secondary | ICD-10-CM | POA: Diagnosis not present

## 2018-06-22 DIAGNOSIS — R278 Other lack of coordination: Secondary | ICD-10-CM | POA: Diagnosis not present

## 2018-06-23 DIAGNOSIS — R278 Other lack of coordination: Secondary | ICD-10-CM | POA: Diagnosis not present

## 2018-06-26 DIAGNOSIS — R278 Other lack of coordination: Secondary | ICD-10-CM | POA: Diagnosis not present

## 2018-06-27 ENCOUNTER — Ambulatory Visit: Payer: Medicare Other | Admitting: Family Medicine

## 2018-06-27 DIAGNOSIS — R278 Other lack of coordination: Secondary | ICD-10-CM | POA: Diagnosis not present

## 2018-06-28 DIAGNOSIS — R278 Other lack of coordination: Secondary | ICD-10-CM | POA: Diagnosis not present

## 2018-06-29 DIAGNOSIS — R278 Other lack of coordination: Secondary | ICD-10-CM | POA: Diagnosis not present

## 2018-06-30 DIAGNOSIS — R278 Other lack of coordination: Secondary | ICD-10-CM | POA: Diagnosis not present

## 2018-07-01 DIAGNOSIS — R278 Other lack of coordination: Secondary | ICD-10-CM | POA: Diagnosis not present

## 2018-07-02 DIAGNOSIS — R278 Other lack of coordination: Secondary | ICD-10-CM | POA: Diagnosis not present

## 2018-07-03 DIAGNOSIS — R278 Other lack of coordination: Secondary | ICD-10-CM | POA: Diagnosis not present

## 2018-07-04 DIAGNOSIS — K219 Gastro-esophageal reflux disease without esophagitis: Secondary | ICD-10-CM | POA: Diagnosis not present

## 2018-07-04 DIAGNOSIS — R278 Other lack of coordination: Secondary | ICD-10-CM | POA: Diagnosis not present

## 2018-07-04 DIAGNOSIS — I10 Essential (primary) hypertension: Secondary | ICD-10-CM | POA: Diagnosis not present

## 2018-07-04 DIAGNOSIS — E039 Hypothyroidism, unspecified: Secondary | ICD-10-CM | POA: Diagnosis not present

## 2018-07-07 DIAGNOSIS — R278 Other lack of coordination: Secondary | ICD-10-CM | POA: Diagnosis not present

## 2018-07-08 DIAGNOSIS — R278 Other lack of coordination: Secondary | ICD-10-CM | POA: Diagnosis not present

## 2018-07-09 DIAGNOSIS — R278 Other lack of coordination: Secondary | ICD-10-CM | POA: Diagnosis not present

## 2018-07-10 DIAGNOSIS — R278 Other lack of coordination: Secondary | ICD-10-CM | POA: Diagnosis not present

## 2018-07-11 DIAGNOSIS — M545 Low back pain: Secondary | ICD-10-CM | POA: Diagnosis not present

## 2018-07-11 DIAGNOSIS — R109 Unspecified abdominal pain: Secondary | ICD-10-CM | POA: Diagnosis not present

## 2018-07-11 DIAGNOSIS — R278 Other lack of coordination: Secondary | ICD-10-CM | POA: Diagnosis not present

## 2018-07-12 DIAGNOSIS — R278 Other lack of coordination: Secondary | ICD-10-CM | POA: Diagnosis not present

## 2018-07-14 DIAGNOSIS — R278 Other lack of coordination: Secondary | ICD-10-CM | POA: Diagnosis not present

## 2018-07-23 DIAGNOSIS — I10 Essential (primary) hypertension: Secondary | ICD-10-CM | POA: Diagnosis not present

## 2018-07-23 DIAGNOSIS — R410 Disorientation, unspecified: Secondary | ICD-10-CM | POA: Diagnosis not present

## 2018-07-31 DIAGNOSIS — Z79899 Other long term (current) drug therapy: Secondary | ICD-10-CM | POA: Diagnosis not present

## 2018-08-10 DIAGNOSIS — I10 Essential (primary) hypertension: Secondary | ICD-10-CM | POA: Diagnosis not present

## 2018-08-10 DIAGNOSIS — R410 Disorientation, unspecified: Secondary | ICD-10-CM | POA: Diagnosis not present

## 2018-08-14 DIAGNOSIS — L539 Erythematous condition, unspecified: Secondary | ICD-10-CM | POA: Diagnosis not present

## 2018-08-14 DIAGNOSIS — M79672 Pain in left foot: Secondary | ICD-10-CM | POA: Diagnosis not present

## 2018-08-14 DIAGNOSIS — M25572 Pain in left ankle and joints of left foot: Secondary | ICD-10-CM | POA: Diagnosis not present

## 2018-08-15 DIAGNOSIS — M25572 Pain in left ankle and joints of left foot: Secondary | ICD-10-CM | POA: Diagnosis not present

## 2018-08-15 DIAGNOSIS — M79672 Pain in left foot: Secondary | ICD-10-CM | POA: Diagnosis not present

## 2018-08-16 DIAGNOSIS — M79673 Pain in unspecified foot: Secondary | ICD-10-CM | POA: Diagnosis not present

## 2019-04-12 DEATH — deceased
